# Patient Record
Sex: Male | Born: 1940
Health system: Southern US, Community
[De-identification: ages and names within clinical notes are randomized; demographics above are authoritative.]

## PROBLEM LIST (undated history)

## (undated) DIAGNOSIS — I447 Left bundle-branch block, unspecified: Secondary | ICD-10-CM

## (undated) DIAGNOSIS — I428 Other cardiomyopathies: Secondary | ICD-10-CM

## (undated) DIAGNOSIS — M1990 Unspecified osteoarthritis, unspecified site: Secondary | ICD-10-CM

## (undated) DIAGNOSIS — M75100 Unspecified rotator cuff tear or rupture of unspecified shoulder, not specified as traumatic: Secondary | ICD-10-CM

## (undated) DIAGNOSIS — F329 Major depressive disorder, single episode, unspecified: Secondary | ICD-10-CM

## (undated) DIAGNOSIS — R0602 Shortness of breath: Secondary | ICD-10-CM

## (undated) DIAGNOSIS — E785 Hyperlipidemia, unspecified: Secondary | ICD-10-CM

## (undated) DIAGNOSIS — I10 Essential (primary) hypertension: Secondary | ICD-10-CM

## (undated) DIAGNOSIS — F32A Depression, unspecified: Secondary | ICD-10-CM

## (undated) DIAGNOSIS — K635 Polyp of colon: Secondary | ICD-10-CM

## (undated) HISTORY — DX: Other cardiomyopathies: I42.8

## (undated) HISTORY — PX: BACK SURGERY: SHX140

## (undated) HISTORY — DX: Hyperlipidemia, unspecified: E78.5

## (undated) HISTORY — PX: EYE SURGERY: SHX253

## (undated) HISTORY — DX: Left bundle-branch block, unspecified: I44.7

---

## 1998-04-30 ENCOUNTER — Ambulatory Visit (HOSPITAL_COMMUNITY): Admission: RE | Admit: 1998-04-30 | Discharge: 1998-04-30 | Payer: Self-pay | Admitting: Critical Care Medicine

## 1998-04-30 ENCOUNTER — Encounter: Payer: Self-pay | Admitting: Critical Care Medicine

## 1998-05-15 ENCOUNTER — Encounter: Payer: Self-pay | Admitting: Critical Care Medicine

## 1998-05-15 ENCOUNTER — Ambulatory Visit: Admission: RE | Admit: 1998-05-15 | Discharge: 1998-05-15 | Payer: Self-pay | Admitting: Critical Care Medicine

## 1999-09-14 ENCOUNTER — Ambulatory Visit (HOSPITAL_COMMUNITY): Admission: RE | Admit: 1999-09-14 | Discharge: 1999-09-14 | Payer: Self-pay | Admitting: *Deleted

## 1999-09-14 ENCOUNTER — Encounter: Payer: Self-pay | Admitting: *Deleted

## 1999-11-09 ENCOUNTER — Ambulatory Visit (HOSPITAL_COMMUNITY)
Admission: RE | Admit: 1999-11-09 | Discharge: 1999-11-09 | Payer: Self-pay | Admitting: Physical Medicine and Rehabilitation

## 1999-11-09 ENCOUNTER — Encounter: Payer: Self-pay | Admitting: Physical Medicine and Rehabilitation

## 2001-08-07 ENCOUNTER — Ambulatory Visit (HOSPITAL_COMMUNITY): Admission: RE | Admit: 2001-08-07 | Discharge: 2001-08-07 | Payer: Self-pay | Admitting: Family Medicine

## 2001-08-07 ENCOUNTER — Encounter: Payer: Self-pay | Admitting: Family Medicine

## 2002-07-23 ENCOUNTER — Encounter: Payer: Self-pay | Admitting: Family Medicine

## 2002-07-23 ENCOUNTER — Ambulatory Visit (HOSPITAL_COMMUNITY): Admission: RE | Admit: 2002-07-23 | Discharge: 2002-07-23 | Payer: Self-pay | Admitting: Family Medicine

## 2002-08-13 ENCOUNTER — Ambulatory Visit (HOSPITAL_COMMUNITY): Admission: RE | Admit: 2002-08-13 | Discharge: 2002-08-13 | Payer: Self-pay | Admitting: Pulmonary Disease

## 2003-01-02 ENCOUNTER — Ambulatory Visit (HOSPITAL_COMMUNITY): Admission: RE | Admit: 2003-01-02 | Discharge: 2003-01-02 | Payer: Self-pay | Admitting: Internal Medicine

## 2004-08-05 ENCOUNTER — Ambulatory Visit (HOSPITAL_COMMUNITY): Admission: RE | Admit: 2004-08-05 | Discharge: 2004-08-05 | Payer: Self-pay | Admitting: Internal Medicine

## 2005-08-04 ENCOUNTER — Ambulatory Visit (HOSPITAL_COMMUNITY): Admission: RE | Admit: 2005-08-04 | Discharge: 2005-08-04 | Payer: Self-pay | Admitting: Family Medicine

## 2005-08-29 ENCOUNTER — Encounter (INDEPENDENT_AMBULATORY_CARE_PROVIDER_SITE_OTHER): Payer: Self-pay | Admitting: *Deleted

## 2005-08-29 ENCOUNTER — Ambulatory Visit (HOSPITAL_COMMUNITY): Admission: RE | Admit: 2005-08-29 | Discharge: 2005-08-29 | Payer: Self-pay | Admitting: Internal Medicine

## 2005-08-29 ENCOUNTER — Ambulatory Visit: Payer: Self-pay | Admitting: Internal Medicine

## 2006-02-13 ENCOUNTER — Ambulatory Visit (HOSPITAL_COMMUNITY): Admission: RE | Admit: 2006-02-13 | Discharge: 2006-02-13 | Payer: Self-pay | Admitting: Family Medicine

## 2006-03-06 ENCOUNTER — Ambulatory Visit (HOSPITAL_COMMUNITY): Admission: RE | Admit: 2006-03-06 | Discharge: 2006-03-06 | Payer: Self-pay | Admitting: Family Medicine

## 2006-03-10 ENCOUNTER — Ambulatory Visit (HOSPITAL_COMMUNITY): Admission: RE | Admit: 2006-03-10 | Discharge: 2006-03-10 | Payer: Self-pay | Admitting: Family Medicine

## 2006-03-23 ENCOUNTER — Ambulatory Visit (HOSPITAL_COMMUNITY): Admission: RE | Admit: 2006-03-23 | Discharge: 2006-03-23 | Payer: Self-pay | Admitting: Family Medicine

## 2006-04-20 ENCOUNTER — Ambulatory Visit (HOSPITAL_COMMUNITY): Admission: RE | Admit: 2006-04-20 | Discharge: 2006-04-20 | Payer: Self-pay | Admitting: Pulmonary Disease

## 2006-04-25 ENCOUNTER — Ambulatory Visit (HOSPITAL_COMMUNITY): Admission: RE | Admit: 2006-04-25 | Discharge: 2006-04-25 | Payer: Self-pay | Admitting: Internal Medicine

## 2006-04-25 ENCOUNTER — Ambulatory Visit: Payer: Self-pay | Admitting: Internal Medicine

## 2006-10-27 ENCOUNTER — Ambulatory Visit (HOSPITAL_COMMUNITY): Admission: RE | Admit: 2006-10-27 | Discharge: 2006-10-27 | Payer: Self-pay | Admitting: Pulmonary Disease

## 2007-04-12 ENCOUNTER — Ambulatory Visit (HOSPITAL_COMMUNITY): Admission: RE | Admit: 2007-04-12 | Discharge: 2007-04-12 | Payer: Self-pay | Admitting: Internal Medicine

## 2007-04-12 ENCOUNTER — Encounter (INDEPENDENT_AMBULATORY_CARE_PROVIDER_SITE_OTHER): Payer: Self-pay | Admitting: Internal Medicine

## 2008-02-25 ENCOUNTER — Ambulatory Visit: Payer: Self-pay | Admitting: Orthopedic Surgery

## 2008-02-25 DIAGNOSIS — M75120 Complete rotator cuff tear or rupture of unspecified shoulder, not specified as traumatic: Secondary | ICD-10-CM

## 2008-02-25 DIAGNOSIS — M25519 Pain in unspecified shoulder: Secondary | ICD-10-CM

## 2008-02-25 DIAGNOSIS — M7580 Other shoulder lesions, unspecified shoulder: Secondary | ICD-10-CM

## 2008-02-26 ENCOUNTER — Encounter (HOSPITAL_COMMUNITY): Admission: RE | Admit: 2008-02-26 | Discharge: 2008-03-27 | Payer: Self-pay | Admitting: Orthopedic Surgery

## 2008-02-26 ENCOUNTER — Encounter: Payer: Self-pay | Admitting: Orthopedic Surgery

## 2008-03-11 ENCOUNTER — Telehealth: Payer: Self-pay | Admitting: Orthopedic Surgery

## 2008-03-12 ENCOUNTER — Ambulatory Visit (HOSPITAL_COMMUNITY): Admission: RE | Admit: 2008-03-12 | Discharge: 2008-03-12 | Payer: Self-pay | Admitting: Orthopedic Surgery

## 2008-03-14 ENCOUNTER — Encounter: Payer: Self-pay | Admitting: Orthopedic Surgery

## 2008-03-17 ENCOUNTER — Ambulatory Visit: Payer: Self-pay | Admitting: Orthopedic Surgery

## 2008-03-28 ENCOUNTER — Encounter (HOSPITAL_COMMUNITY): Admission: RE | Admit: 2008-03-28 | Discharge: 2008-04-04 | Payer: Self-pay | Admitting: Orthopedic Surgery

## 2008-04-04 ENCOUNTER — Encounter: Payer: Self-pay | Admitting: Orthopedic Surgery

## 2008-05-13 ENCOUNTER — Ambulatory Visit (HOSPITAL_COMMUNITY): Admission: RE | Admit: 2008-05-13 | Discharge: 2008-05-13 | Payer: Self-pay | Admitting: Pulmonary Disease

## 2008-06-23 HISTORY — PX: CARDIOVASCULAR STRESS TEST: SHX262

## 2009-09-22 ENCOUNTER — Encounter: Payer: Self-pay | Admitting: Cardiovascular Disease

## 2009-09-22 ENCOUNTER — Encounter: Payer: Self-pay | Admitting: Emergency Medicine

## 2009-09-22 ENCOUNTER — Ambulatory Visit: Payer: Self-pay | Admitting: Cardiovascular Disease

## 2009-09-22 ENCOUNTER — Inpatient Hospital Stay (HOSPITAL_COMMUNITY): Admission: RE | Admit: 2009-09-22 | Discharge: 2009-09-23 | Payer: Self-pay | Admitting: Cardiovascular Disease

## 2009-09-22 HISTORY — PX: CARDIAC CATHETERIZATION: SHX172

## 2009-09-22 HISTORY — PX: TRANSTHORACIC ECHOCARDIOGRAM: SHX275

## 2009-09-23 ENCOUNTER — Ambulatory Visit: Payer: Self-pay | Admitting: Vascular Surgery

## 2010-04-16 LAB — CARDIAC PANEL(CRET KIN+CKTOT+MB+TROPI)
CK, MB: 1.6 ng/mL (ref 0.3–4.0)
CK, MB: 3.6 ng/mL (ref 0.3–4.0)
Relative Index: INVALID (ref 0.0–2.5)
Relative Index: INVALID (ref 0.0–2.5)
Total CK: 71 U/L (ref 7–232)
Total CK: 80 U/L (ref 7–232)
Troponin I: 0.14 ng/mL — ABNORMAL HIGH (ref 0.00–0.06)
Troponin I: 0.21 ng/mL — ABNORMAL HIGH (ref 0.00–0.06)
Troponin I: 0.37 ng/mL — ABNORMAL HIGH (ref 0.00–0.06)

## 2010-04-16 LAB — DIFFERENTIAL
Basophils Absolute: 0 10*3/uL (ref 0.0–0.1)
Basophils Relative: 1 % (ref 0–1)
Lymphocytes Relative: 17 % (ref 12–46)
Neutrophils Relative %: 66 % (ref 43–77)

## 2010-04-16 LAB — HEPATIC FUNCTION PANEL
AST: 18 U/L (ref 0–37)
Alkaline Phosphatase: 65 U/L (ref 39–117)
Total Protein: 6.7 g/dL (ref 6.0–8.3)

## 2010-04-16 LAB — BASIC METABOLIC PANEL
CO2: 27 mEq/L (ref 19–32)
Calcium: 8.9 mg/dL (ref 8.4–10.5)
GFR calc non Af Amer: >60 mL/min (ref >60)
Sodium: 141 mEq/L (ref 135–145)

## 2010-04-16 LAB — POCT CARDIAC MARKERS
CKMB, poc: 3 ng/mL (ref 1.0–8.0)
Myoglobin, poc: 57.2 ng/mL (ref 12–200)
Troponin i, poc: 0.16 ng/mL — ABNORMAL HIGH (ref 0.00–0.09)

## 2010-04-16 LAB — CBC
HCT: 37.8 % — ABNORMAL LOW (ref 39.0–52.0)
Hemoglobin: 12.3 g/dL — ABNORMAL LOW (ref 13.0–17.0)
MCH: 28.3 pg (ref 26.0–34.0)
MCHC: 33.2 g/dL (ref 30.0–36.0)
Platelets: 199 10*3/uL (ref 150–400)
RBC: 4.44 MIL/uL (ref 4.22–5.81)
RBC: 4.48 MIL/uL (ref 4.22–5.81)
RDW: 14.1 % (ref 11.5–15.5)
WBC: 5.4 10*3/uL (ref 4.0–10.5)

## 2010-04-16 LAB — LIPID PANEL
HDL: 45 mg/dL (ref >39)
LDL Cholesterol: 58 mg/dL (ref 0–99)
Total CHOL/HDL Ratio: 2.4 RATIO
Triglycerides: 28 mg/dL (ref <150)

## 2010-04-16 LAB — PROTIME-INR
INR: 1.04 (ref 0.00–1.49)
Prothrombin Time: 13.8 seconds (ref 11.6–15.2)

## 2010-05-03 ENCOUNTER — Ambulatory Visit (HOSPITAL_COMMUNITY)
Admission: RE | Admit: 2010-05-03 | Discharge: 2010-05-03 | Disposition: A | Discharge status: Home / Self Care | Payer: Medicare Other | Source: Ambulatory Visit | Attending: Pulmonary Disease | Admitting: Pulmonary Disease

## 2010-05-03 ENCOUNTER — Other Ambulatory Visit (HOSPITAL_COMMUNITY): Payer: Self-pay | Admitting: Pulmonary Disease

## 2010-05-03 DIAGNOSIS — R0602 Shortness of breath: Secondary | ICD-10-CM | POA: Insufficient documentation

## 2010-05-03 DIAGNOSIS — J849 Interstitial pulmonary disease, unspecified: Secondary | ICD-10-CM

## 2010-05-03 DIAGNOSIS — I10 Essential (primary) hypertension: Secondary | ICD-10-CM | POA: Insufficient documentation

## 2010-05-03 DIAGNOSIS — J8410 Pulmonary fibrosis, unspecified: Secondary | ICD-10-CM | POA: Insufficient documentation

## 2010-06-18 NOTE — Op Note (Signed)
NAME:  Matthew Weiss, Matthew Weiss               ACCOUNT NO.:  [id number]   MEDICAL RECORD NO.:  [id number]          PATIENT TYPE:  AMB   LOCATION:  DAY                           FACILITY:  [facility]   PHYSICIAN:  Lionel December, M.D.    DATE OF BIRTH:  1940-03-06   DATE OF PROCEDURE:  08/29/2005  DATE OF DISCHARGE:                                 OPERATIVE REPORT   PROCEDURE:  Colonoscopy with polypectomy.   INDICATION:  [name] is a 70 year old African-American male who is here for  screening colonoscopy.  Family history is negative for colorectal carcinoma.  Procedure is reviewed with the patient and informed consent was obtained.   CONSCIOUS SEDATION:  1.  Demerol 50 mg IV.  2.  Versed 7 mg IV.   FINDINGS:  Procedure performed in Endoscopy Suite.  Patient's vital signs  and O2 sat were monitored during procedure and remained stable.  Patient was  placed in the left lateral recumbent position and rectal examination  performed.  No abnormality noted on external or digital exam.  Olympus  videoscope was placed in rectum and advanced under vision in the sigmoid  colon and beyond.  Preparation was excellent.  The scope was passed in the  cecum which was identified by the appendiceal orifice and ileocecal valve.  A picture was taken for the record.  As the scope was withdrawn colonic  mucosa was carefully examined.  There was a large polyp at ascending colon  with a polypoidal and a sessile part.  Polypoidal part was snared.  Rest of  the polyp was snared piecemeal.  Polypectomy was felt to be complete.  The  large and smaller fragments were caught with a net and retrieved for  histologic examination.  Endoscope was passed again to splenic flexure.  The  mucosa, rest of the colon was normal.  Rectal mucosa similarly was normal.  The scope was retroflexed, examined the anorectal junction which was  unremarkable.  Endoscope was straightened and withdrawn.  The patient  tolerated the procedure well.   FINAL DIAGNOSIS:  Large, i.e., over 2 cm polyp snared from ascending colon  as described above.   RECOMMENDATIONS:  1.  No aspirin for 10 days.  2.  I will be contacting patient with biopsy results.  3.  Presuming this is a benign polyp, he will return for repeat exam in 6      months so to make sure that he does not have any residual polyp.      Lionel December, M.D.  Electronically Signed     NR/MEDQ  D:  08/29/2005  T:  08/29/2005  Job:  [id number]   cc:   Angus G. Renard Matter, MD  Fax: [phone]

## 2010-06-18 NOTE — Procedures (Signed)
NAME:  Matthew Weiss, Matthew Weiss NO.:  [id number]   MEDICAL RECORD NO.:  [id number]          PATIENT TYPE:  OUT   LOCATION:  RESP                          FACILITY:  [facility]   PHYSICIAN:  Edward L. Juanetta Gosling, M.D.DATE OF BIRTH:  1940-10-10   DATE OF PROCEDURE:  DATE OF DISCHARGE:  10/27/2006                            PULMONARY FUNCTION TEST   [name] and  1. Spirometry shows of a moderate ventilatory defect with evidence of      airflow obstruction.  2. Lung volumes are also moderately reduced, at approximately the same      order of magnitude as a ventilatory defect.  3. DLCO is normal.  4. Arterial blood gases were normal.  5. There is no significant bronchodilator effect.  6. There has been some improvement in airflow, but total lung capacity      has decreased, since the last study.      Edward L. Juanetta Gosling, M.D.  Electronically Signed     ELH/MEDQ  D:  10/31/2006  T:  10/31/2006  Job:  [id number]

## 2010-06-18 NOTE — Procedures (Signed)
NAME:  Matthew Weiss, Matthew Weiss NO.:  [id number]   MEDICAL RECORD NO.:  [id number]          PATIENT TYPE:  OUT   LOCATION:  RESP                          FACILITY:  [facility]   PHYSICIAN:  Edward L. Juanetta Gosling, M.D.DATE OF BIRTH:  [birth year]   DATE OF PROCEDURE:  04/20/2006  DATE OF DISCHARGE:                            PULMONARY FUNCTION TEST   RESULTS:  1. Spirometry shows a moderate ventilatory defect with airflow      obstruction.  2. Lung volumes show reduction in total lung capacity which is mild to      moderate.  3. DLCO is normal.  4. Arterial blood gases are normal.  5. There is no significant bronchodilator effect.      Edward L. Juanetta Gosling, M.D.  Electronically Signed     ELH/MEDQ  D:  04/20/2006  T:  04/20/2006  Job:  [id number]

## 2010-06-18 NOTE — Procedures (Signed)
NAME:  Matthew Weiss, Matthew Weiss NO.:  [id number]   MEDICAL RECORD NO.:  [id number]          PATIENT TYPE:  OUT   LOCATION:  RESP                          FACILITY:  [facility]   PHYSICIAN:  Edward L. Juanetta Gosling, M.D.DATE OF BIRTH:  02/18/1940   DATE OF PROCEDURE:  DATE OF DISCHARGE:  05/13/2008                            PULMONARY FUNCTION TEST   PULMONARY FUNCTION TEST:  1. Spirometry shows a moderate ventilatory defect with some evidence      of airflow obstruction, but the flow-volume loop is suggestive more      of a restrictive change.  2. Lung volumes show moderate restrictive change.  3. DLCO is normal.  4. There is no significant bronchodilator improvement.  5. There is little change from pulmonary function testing of March      2008.      Edward L. Juanetta Gosling, M.D.  Electronically Signed     ELH/MEDQ  D:  05/15/2008  T:  05/15/2008  Job:  [id number]

## 2010-06-18 NOTE — Op Note (Signed)
NAME:  Matthew Weiss, Matthew Weiss               ACCOUNT NO.:  [id number]   MEDICAL RECORD NO.:  [id number]          PATIENT TYPE:  AMB   LOCATION:  DAY                           FACILITY:  [facility]   PHYSICIAN:  Lionel December, M.D.    DATE OF BIRTH:  [birth year]   DATE OF PROCEDURE:  04/25/2006  DATE OF DISCHARGE:                               OPERATIVE REPORT   DATE OF PROCEDURE:  April 25, 2006   PROCEDURE:  Colonoscopy.   INDICATIONS:  [name] is a 70 year old African-American male who  underwent screening colonoscopy in July, 2007.  He had a large polyp  removed from his the ascending colon which was partly pedunculated,  partly sessile; it was a tubular adenoma.  The patient was advised  followup exam to make sure there was no residual polyp.  Exam at six  months to make sure he does not have any residual polyp at this site.  He remains free of GI symptoms.  Procedure was reviewed with the patient  and informed consent was obtained.   MEDICATIONS:  Meds for conscious sedation, Demerol 50 mg IV, Versed 6 mg  IV in divided doses.   DESCRIPTION OF PROCEDURE:  Procedure performed in endoscopy suite.  The  patient's vital signs and O2 sat were monitored during procedure and  remained stable.  The patient was placed in the left lateral position  and rectal examination performed.  No abnormality noted on external or  digital exam.  Pentax videoscope was placed in the rectum and advanced  under direct vision and advanced into the sigmoid colon beyond. The  preparation was excellent.  Scope was passed into the cecum which was  identified by appendiceal orifice and ileocecal valve.  Pictures taken  for the record.  As the scope was withdrawn colonic mucosa was carefully  examined.  There was a scar marked by converging folds at the ascending  colon, along the right side, felt to be polypectomy site.  There was no  residual polyp noted.  The rest of the colonic mucosa was carefully  examined as the  scope was gradually withdrawn and there were no other  mucosal abnormalities.  Rectal mucosa similarly was normal.  Scope was  retroflexed to examine anorectal junction and small hemorrhoids were  noted below the dentate line.  Endoscope was then withdrawn.  The  patient tolerated the procedure well.   FINAL DIAGNOSIS:  1. Examination performed to the cecum.  2. No evidence of residual polyp at the ascending colon, site of      previous polypectomy.  3. Small external hemorrhoids.   RECOMMENDATIONS:  He will resume his usual medicines and diet.   Yearly hemoccults.  He will return for the next exam in five years from  now.      Lionel December, M.D.  Electronically Signed     NR/MEDQ  D:  04/25/2006  T:  04/25/2006  Job:  [id number]   cc:   Angus G. Renard Matter, MD  Fax: [phone]

## 2010-06-18 NOTE — Procedures (Signed)
   NAME:  Matthew Weiss, Matthew Weiss NO.:  [id number]   MEDICAL RECORD NO.:  [id number]                   PATIENT TYPE:  OUT   LOCATION:  DFTL                                 FACILITY:  [facility]   PHYSICIAN:  Edward L. Juanetta Gosling, M.D.             DATE OF BIRTH:  [birth year]   DATE OF PROCEDURE:  08/13/2002  DATE OF DISCHARGE:                                    STRESS TEST   INDICATIONS FOR PROCEDURE:  Matthew Weiss has history of hypertension.  He is  undergoing graded exercise testing to rule out ischemic cardiac disease as a  consequence of his hypertension.  There are no contraindications to graded  exercise testing.   Matthew Weiss exercised for seven minutes 30 seconds on the Bruce protocol,  reaching and sustaining 10.1. METs.  His maximum recorded heart rate was 155  which is 97% of his age-predicted maximal heart rate.  His blood pressure  response to exercise was somewhat exaggerated.  He had no  electrocardiographic changes suggestive of inducible ischemia.  At the peak  of exercise he had nonspecific ST abnormalities in V6 which was isolated.  He had no symptoms during exercise.   IMPRESSION:  1. Good exercise tolerance.  2. Nonspecific ST-T wave changes isolated in V6 at the peak of exercise but     no definitive ischemic changes.  3. Somewhat hypertensive response to exercise.  4. No symptoms of exercise.                                               Edward L. Juanetta Gosling, M.D.    ELH/MEDQ  D:  08/13/2002  T:  08/13/2002  Job:  [id number]   cc:   Angus G. Renard Matter, M.D.  [address]  [place]  [place] [address]  Fax: [phone]

## 2010-10-01 ENCOUNTER — Emergency Department (HOSPITAL_COMMUNITY)
Admission: EM | Admit: 2010-10-01 | Discharge: 2010-10-01 | Disposition: A | Discharge status: Home / Self Care | Payer: Medicare Other | Attending: Emergency Medicine | Admitting: Emergency Medicine

## 2010-10-01 ENCOUNTER — Emergency Department (HOSPITAL_COMMUNITY): Payer: Medicare Other

## 2010-10-01 ENCOUNTER — Encounter: Payer: Self-pay | Admitting: Emergency Medicine

## 2010-10-01 DIAGNOSIS — E785 Hyperlipidemia, unspecified: Secondary | ICD-10-CM | POA: Insufficient documentation

## 2010-10-01 DIAGNOSIS — S6710XA Crushing injury of unspecified finger(s), initial encounter: Secondary | ICD-10-CM | POA: Insufficient documentation

## 2010-10-01 DIAGNOSIS — S61319A Laceration without foreign body of unspecified finger with damage to nail, initial encounter: Secondary | ICD-10-CM

## 2010-10-01 DIAGNOSIS — T148XXA Other injury of unspecified body region, initial encounter: Secondary | ICD-10-CM

## 2010-10-01 DIAGNOSIS — Z87891 Personal history of nicotine dependence: Secondary | ICD-10-CM | POA: Insufficient documentation

## 2010-10-01 DIAGNOSIS — S61209A Unspecified open wound of unspecified finger without damage to nail, initial encounter: Secondary | ICD-10-CM | POA: Insufficient documentation

## 2010-10-01 DIAGNOSIS — Y92009 Unspecified place in unspecified non-institutional (private) residence as the place of occurrence of the external cause: Secondary | ICD-10-CM | POA: Insufficient documentation

## 2010-10-01 DIAGNOSIS — I10 Essential (primary) hypertension: Secondary | ICD-10-CM | POA: Insufficient documentation

## 2010-10-01 DIAGNOSIS — W230XXA Caught, crushed, jammed, or pinched between moving objects, initial encounter: Secondary | ICD-10-CM | POA: Insufficient documentation

## 2010-10-01 HISTORY — DX: Essential (primary) hypertension: I10

## 2010-10-01 MED ORDER — CEPHALEXIN 500 MG PO CAPS: 500.0000 mg | ORAL_CAPSULE | Freq: Four times a day (QID) | ORAL | Status: AC

## 2010-10-01 MED ORDER — OXYCODONE-ACETAMINOPHEN 5-325 MG PO TABS: 2.0000 | ORAL_TABLET | ORAL | Status: AC | PRN

## 2010-10-01 MED ORDER — LIDOCAINE HCL (PF) 1 % IJ SOLN
30.0000 mL | Freq: Once | INTRAMUSCULAR | Status: DC
  Filled 2010-10-01 (×2): qty 5

## 2010-10-01 MED ORDER — TETANUS-DIPHTHERIA TOXOIDS TD 5-2 LFU IM INJ
0.5000 mL | INJECTION | Freq: Once | INTRAMUSCULAR | Status: AC
  Administered 2010-10-01: 0.5 mL via INTRAMUSCULAR
  Filled 2010-10-01 (×2): qty 0.5

## 2010-10-01 NOTE — ED Provider Notes (Signed)
Scribed for Matthew Octave, MD, the patient was seen in room APA18/AP[facility]18. This chart was scribed by [employer]. The patient's care started at 16:24  CSN: [id number] Arrival date & time: 10/01/2010  4:05 PM  Chief Complaint  Patient presents with  . Finger Injury   HPI Matthew Weiss is a 70 y.o. male with history of HTN who presents to the Emergency Department complaining of finger injury. Patient reports his right thumb was pinched right between the gas pedal and metal portion of a lawn mower. Patient is in no acute distress.  Past Medical History  Diagnosis Date  . Hypertension   . Hyperlipemia     Past Surgical History  Procedure Date  . Back surgery     No family history on file.  History  Substance Use Topics  . Smoking status: Former Games developer  . Smokeless tobacco: Not on file  . Alcohol Use: No      Review of Systems  Musculoskeletal:       Crush injury to right thumb  All other systems reviewed and are negative.    Physical Exam  BP 143/74  Pulse 77  Temp(Src) 98.3 F (36.8 C) (Oral)  Resp 20  Ht 5\' 9"  (1.753 m)  Wt 168 lb (76.204 kg)  BMI 24.81 kg/m2  SpO2 98%  Physical Exam  Nursing note and vitals reviewed. Constitutional: He appears well-developed and well-nourished. No distress.  HENT:  Head: Normocephalic and atraumatic.  Eyes: EOM are normal. Pupils are equal, round, and reactive to light.  Neck: Normal range of motion. Neck supple. No tracheal deviation present.  Cardiovascular:  Pulses:      Radial pulses are 2+ on the right side.  Pulmonary/Chest: No respiratory distress.  Abdominal: Soft. Bowel sounds are normal. He exhibits no distension.  Musculoskeletal:       Hands: Skin: Skin is warm and dry. No rash noted. He is not diaphoretic. No erythema. No pallor.  Psychiatric: He has a normal mood and affect. His behavior is normal.    ED Course  LACERATION REPAIR Date/Time: 10/01/2010 5:25 PM Performed by: Matthew Weiss Authorized by: Matthew Weiss Consent: Verbal consent obtained. Risks and benefits: risks, benefits and alternatives were discussed Consent given by: patient Patient understanding: patient states understanding of the procedure being performed Patient consent: the patient's understanding of the procedure matches consent given Imaging studies: imaging studies available Patient identity confirmed: verbally with patient Time out: Immediately prior to procedure a "time out" was called to verify the correct patient, procedure, equipment, support staff and site/side marked as required. Body area: upper extremity Location details: right thumb Laceration length: 2 cm Tendon involvement: none Nerve involvement: none Vascular damage: no Anesthesia: digital block Local anesthetic: lidocaine 1% without epinephrine Anesthetic total: 20 ml Patient sedated: no Preparation: Patient was prepped and draped in the usual sterile fashion. Irrigation solution: saline Irrigation method: syringe Amount of cleaning: extensive Debridement: minimal Degree of undermining: minimal Subcutaneous closure: 5-0 Chromic gut Number of sutures: 6 Technique: simple Approximation: loose Approximation difficulty: complex Dressing: 4x4 sterile gauze, gauze roll and splint Patient tolerance: Patient tolerated the procedure well with no immediate complications. Comments: Fractured distal nailbed removed and underlying T shaped nail bed laceration identified.  Nailbed repaired with loose chromic gut sutures. Explained to patient that underlying nail plate deformity would be expected.    . OTHER DATA REVIEWED: Nursing notes, vital signs, and past medical records reviewed.    DIAGNOSTIC STUDIES: Oxygen Saturation is 98% on room  air, normal by my interpretation.    LABS / RADIOLOGY:   Dg Finger Thumb Right  10/01/2010   IMPRESSION: No acute bony findings.  Tiny radiodense foreign body in the soft tissue  wound.  Original Report Authenticated By: ERIC A. MANSELL, M.D.     ED COURSE / COORDINATION OF CARE: 16:25 - EDMD examined patient's thumb and ordered the following Orders Placed This Encounter  Procedures  . DG Finger Thumb Right  . Suture cart     MDM: Crush injury to thumb with nailbed involvement.  Xray, update tetanus.  Patient declining pain medications Nail plate removed and nail bed repaired as above.  Antibitiocs, pain control, follow up hand clinic.    MEDICATIONS GIVEN IN THE E.D.  Medications  lidocaine (XYLOCAINE) 1 % injection 30 mL (not administered)  tetanus & diphtheria toxoids (adult) [facility]) injection 0.5 mL (not administered)     SCRIBE ATTESTATION:I personally performed the services described in this documentation, which was scribed in my presence.  The recorded information has been reviewed and considered.   Matthew Octave, MD 10/02/10 [phone]

## 2010-10-01 NOTE — ED Notes (Signed)
edp in to numb area. Nad.

## 2010-10-01 NOTE — ED Notes (Signed)
Patient reports crushing his thumb between the gas petal and the metal portion of a lawn mower. Crush Injury to right thumb. Finger nail noted to be broken. Bleeding controlled. Wound dressed with sterile dressing in triage.

## 2010-11-11 LAB — BLOOD GAS, ARTERIAL
Acid-Base Excess: 1
Bicarbonate: 25.3 — ABNORMAL HIGH
FIO2: 21
O2 Saturation: 96.6
pCO2 arterial: 41.8
pO2, Arterial: 85.9

## 2011-03-09 ENCOUNTER — Ambulatory Visit: Payer: Medicare Other | Admitting: Orthopedic Surgery

## 2011-05-02 ENCOUNTER — Other Ambulatory Visit: Payer: Self-pay

## 2011-05-02 DIAGNOSIS — J8410 Pulmonary fibrosis, unspecified: Secondary | ICD-10-CM

## 2011-05-03 ENCOUNTER — Ambulatory Visit (HOSPITAL_COMMUNITY)
Admission: RE | Admit: 2011-05-03 | Discharge: 2011-05-03 | Disposition: A | Discharge status: Home / Self Care | Payer: Medicare Other | Source: Ambulatory Visit | Attending: Pulmonary Disease | Admitting: Pulmonary Disease

## 2011-05-03 ENCOUNTER — Other Ambulatory Visit (HOSPITAL_COMMUNITY): Payer: Self-pay | Admitting: Pulmonary Disease

## 2011-05-03 DIAGNOSIS — R0989 Other specified symptoms and signs involving the circulatory and respiratory systems: Secondary | ICD-10-CM | POA: Insufficient documentation

## 2011-05-03 DIAGNOSIS — R05 Cough: Secondary | ICD-10-CM | POA: Insufficient documentation

## 2011-05-03 DIAGNOSIS — J8410 Pulmonary fibrosis, unspecified: Secondary | ICD-10-CM | POA: Insufficient documentation

## 2011-05-03 DIAGNOSIS — R059 Cough, unspecified: Secondary | ICD-10-CM | POA: Insufficient documentation

## 2011-05-03 DIAGNOSIS — R0609 Other forms of dyspnea: Secondary | ICD-10-CM | POA: Insufficient documentation

## 2011-05-03 MED ORDER — ALBUTEROL SULFATE (5 MG/ML) 0.5% IN NEBU
2.5000 mg | INHALATION_SOLUTION | Freq: Once | RESPIRATORY_TRACT | Status: AC
  Administered 2011-05-03: 2.5 mg via RESPIRATORY_TRACT

## 2011-05-05 NOTE — Procedures (Signed)
NAME:  Matthew Weiss, Matthew Weiss NO.:  [id number]  MEDICAL RECORD NO.:  [id number]  LOCATION:                                 FACILITY:  PHYSICIAN:  Glorie Dowlen L. Juanetta Gosling, M.D.DATE OF BIRTH:  December 05, 1940  DATE OF PROCEDURE: DATE OF DISCHARGE:                           PULMONARY FUNCTION TEST   Reason for pulmonary function testing is pulmonary fibrosis. 1. Spirometry shows a moderate ventilatory defect without definite     airflow obstruction. 2. Lung volumes show reduction in total lung capacity of approximately     the same order of magnitude as the ventilatory defect suggesting a     primary restrictive change. 3. DLCO is moderately reduced but does correct somewhat with     ventilation. 4. Airway resistance is mildly elevated. 5. There is no significant bronchodilator improvement. 6. This is consistent with the clinical diagnosis of pulmonary     fibrosis.     Jadore Mcguffin L. Juanetta Gosling, M.D.     ELH/MEDQ  D:  05/04/2011  T:  05/04/2011  Job:  [id number]  cc:   Catalina Pizza, M.D. Fax: [phone]

## 2011-05-20 LAB — PULMONARY FUNCTION TEST

## 2011-05-25 NOTE — Procedures (Signed)
NAME:  Matthew Weiss, FRONCZAK NO.:  [id number]  MEDICAL RECORD NO.:  [id number]  LOCATION:                                 FACILITY:  PHYSICIAN:  Diana Armijo L. Juanetta Gosling, M.D.DATE OF BIRTH:  [birth year]  DATE OF PROCEDURE:  05/03/2011 DATE OF DISCHARGE:                           PULMONARY FUNCTION TEST   Reason for pulmonary function testing is pulmonary fibrosis. 1. Spirometry shows a moderate ventilatory defect without definite     airflow obstruction. 2. Lung volumes are moderately to severely reduced. 3. DLCO is moderately reduced. 4. Airway resistance is slightly high. 5. There is no significant bronchodilator improvement. 6. This is consistent with clinical diagnosis of pulmonary fibrosis.     Margee Trentham L. Juanetta Gosling, M.D.     ELH/MEDQ  D:  05/24/2011  T:  05/24/2011  Job:  [id number]

## 2011-08-26 ENCOUNTER — Other Ambulatory Visit (INDEPENDENT_AMBULATORY_CARE_PROVIDER_SITE_OTHER): Payer: Self-pay | Admitting: *Deleted

## 2011-08-26 ENCOUNTER — Telehealth (INDEPENDENT_AMBULATORY_CARE_PROVIDER_SITE_OTHER): Payer: Self-pay | Admitting: *Deleted

## 2011-08-26 DIAGNOSIS — Z86010 Personal history of colonic polyps: Secondary | ICD-10-CM

## 2011-08-26 DIAGNOSIS — Z1211 Encounter for screening for malignant neoplasm of colon: Secondary | ICD-10-CM

## 2011-08-26 NOTE — Telephone Encounter (Signed)
Patient needs movi prep 

## 2011-08-29 MED ORDER — PEG-KCL-NACL-NASULF-NA ASC-C 100 G PO SOLR: 1.0000 | Freq: Once | ORAL | Status: DC

## 2011-09-12 IMAGING — CR DG CHEST 1V PORT
1 series · 1 of 1 positions shown · non-contrast
Comparison: 04/12/2007 and earlier.

CLINICAL DATA: 69-year-old male with chest pain.

PORTABLE CHEST - 1 VIEW

[view not recorded]
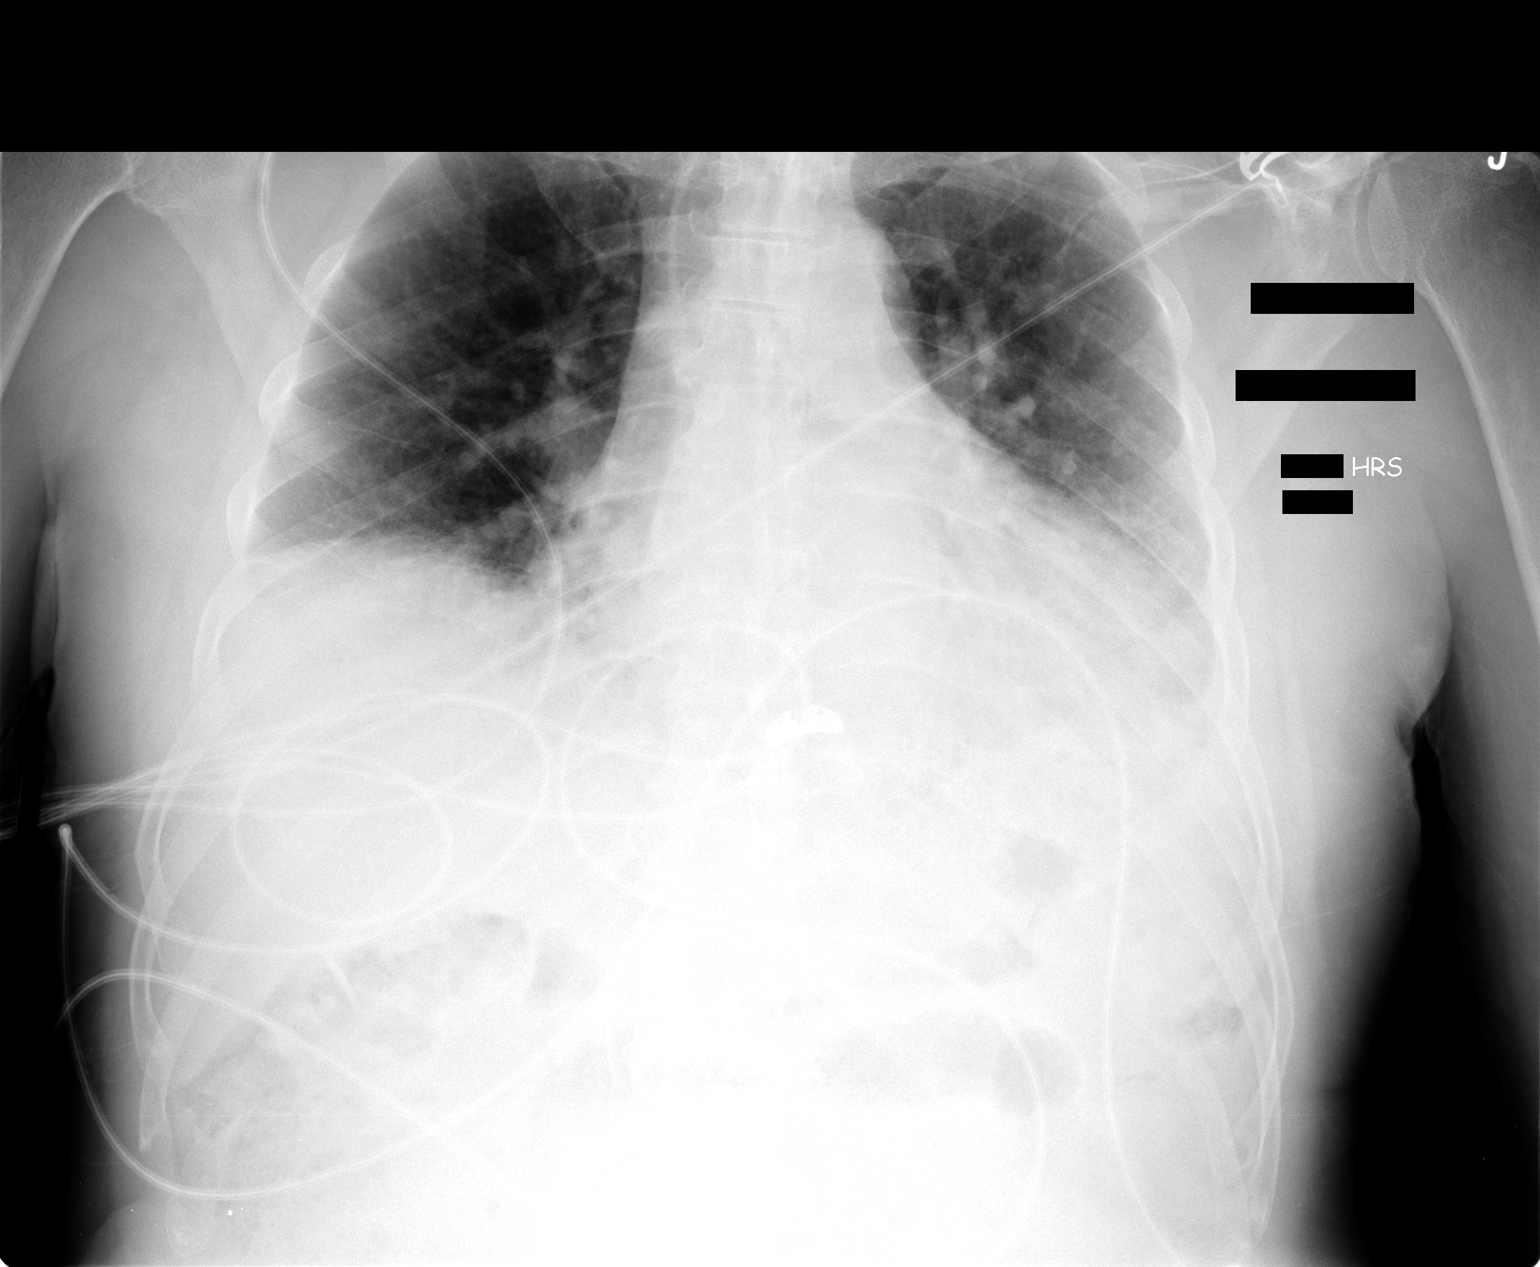

[1 of 1 positions shown; findings below may reference images not displayed]

FINDINGS: Portable upright AP view 5393 hours.  Low lung volumes.
Acute-on-chronic patchy bibasilar opacity.  No pneumothorax,
pulmonary edema or definite pleural effusion. Stable cardiomegaly
and mediastinal contours.
IMPRESSION: Low lung volumes with atelectasis superimposed on chronic basilar
lung disease.

## 2011-09-12 IMAGING — CT CT ANGIO CHEST
2 of 5 series · 19 of 36 positions shown · IV contrast (APPLIED)
Comparison: 03/10/2006

CLINICAL DATA: Right chest pain, shortness of breath

CT ANGIOGRAPHY CHEST WITH CONTRAST
TECHNIQUE: Multidetector CT imaging of the chest was performed
using the standard protocol during bolus administration of
intravenous contrast.  Multiplanar CT image reconstructions
including MIPs were obtained to evaluate the vascular anatomy.
Contrast:  100 ml [id number] IV

[Series 8: pulm embolism 1.0 b25f thins · axial · 0.70mm/px · z∈[-270,-48]mm · 18 of 248 slices shown]
[im 13/248  lung]
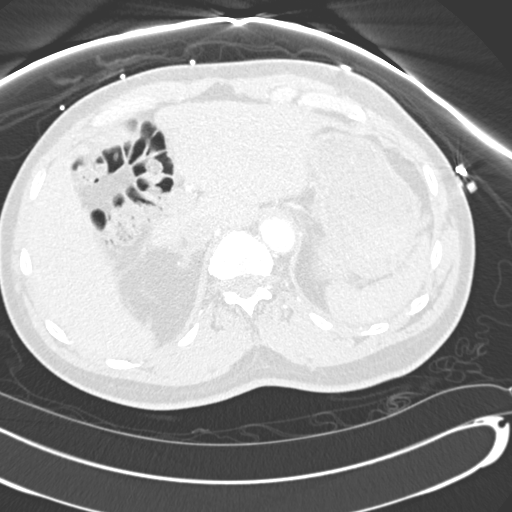
[im 25/248  mediastinal]
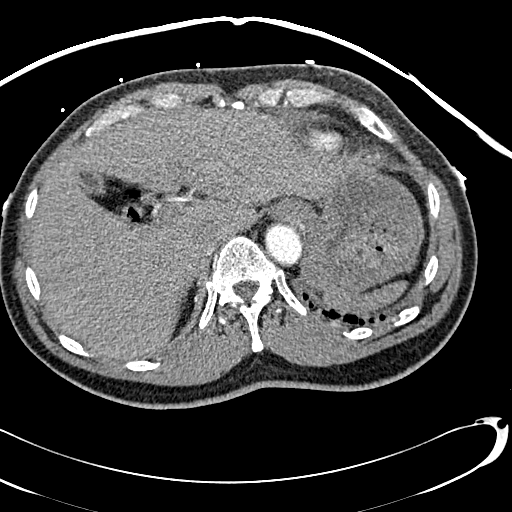
[im 38/248  lung]
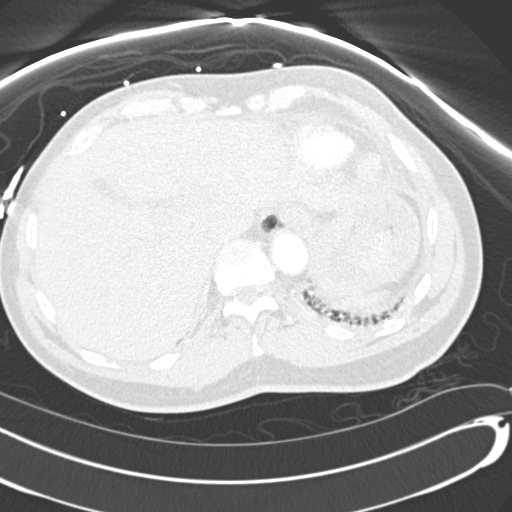
[im 50/248  mediastinal]
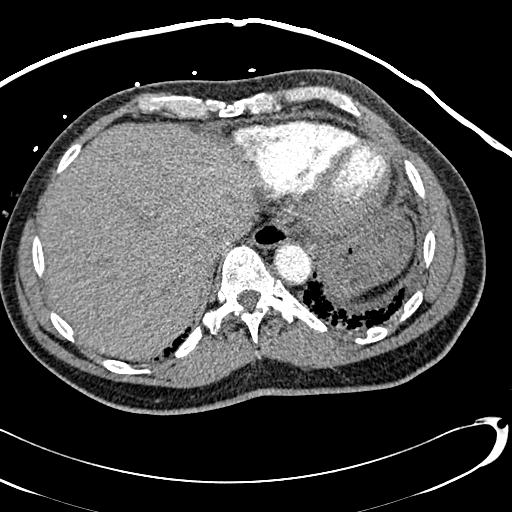
[im 62/248  lung]
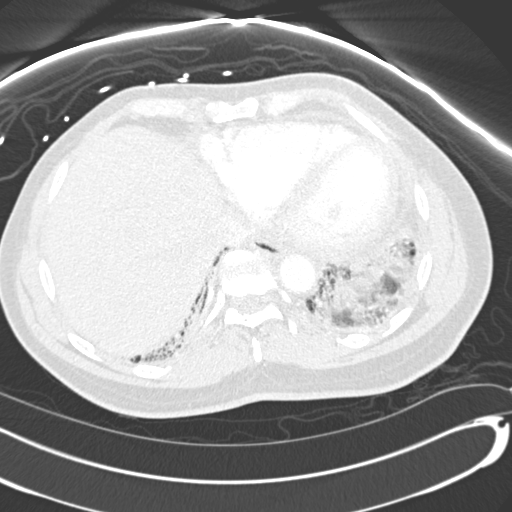
[im 75/248  mediastinal]
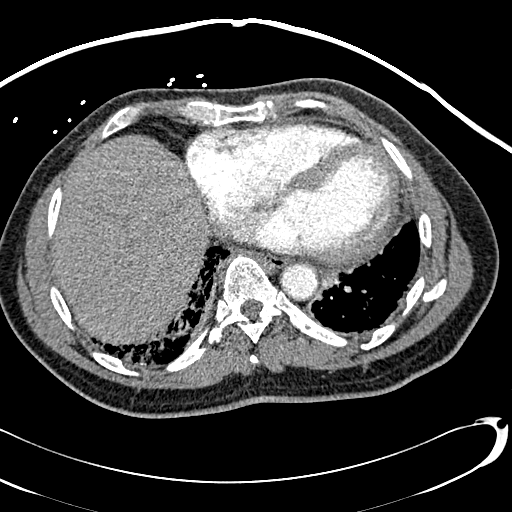
[im 87/248  lung]
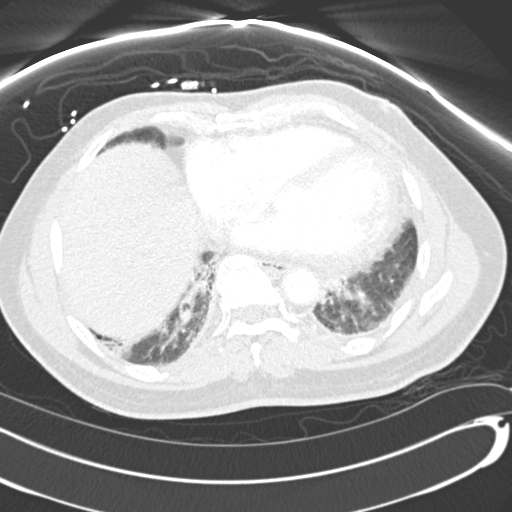
[im 99/248  mediastinal]
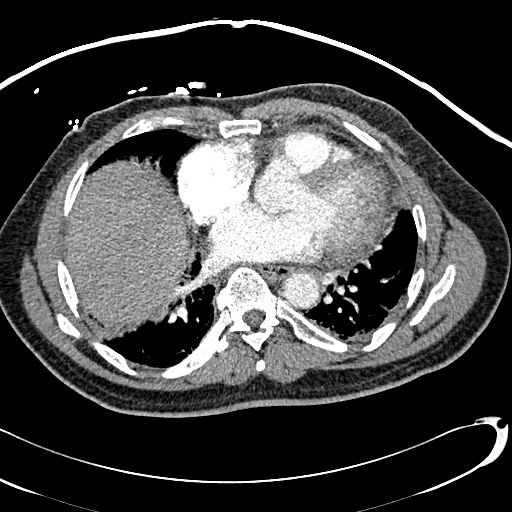
[im 112/248  lung]
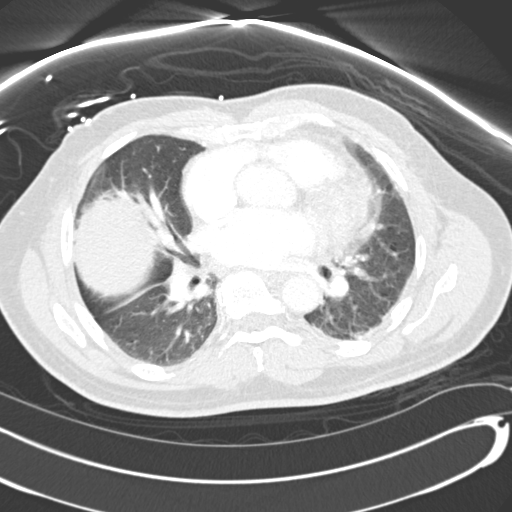
[im 136/248  mediastinal]
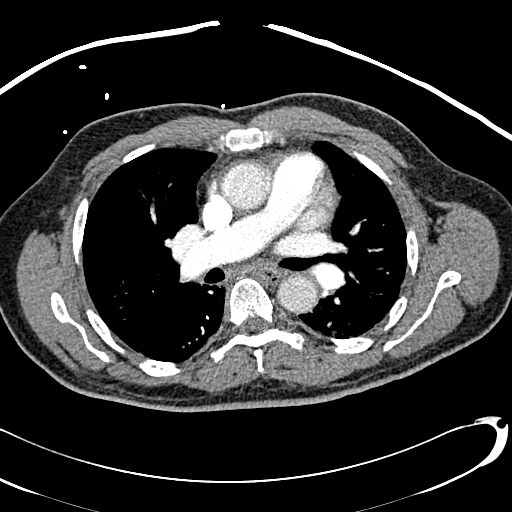
[im 149/248  lung]
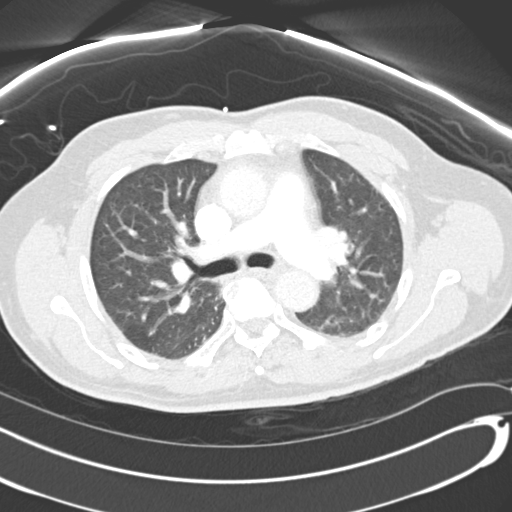
[im 161/248  mediastinal]
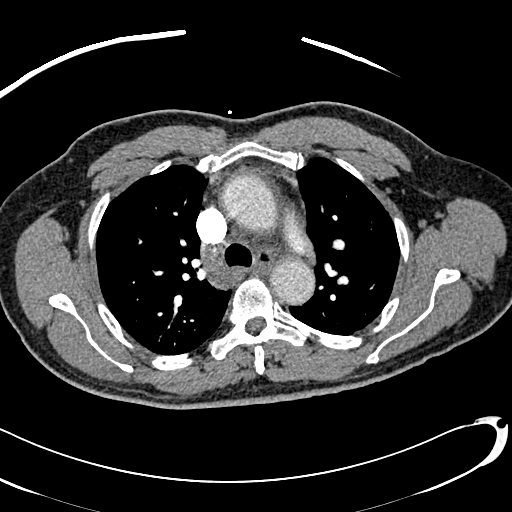
[im 173/248  lung]
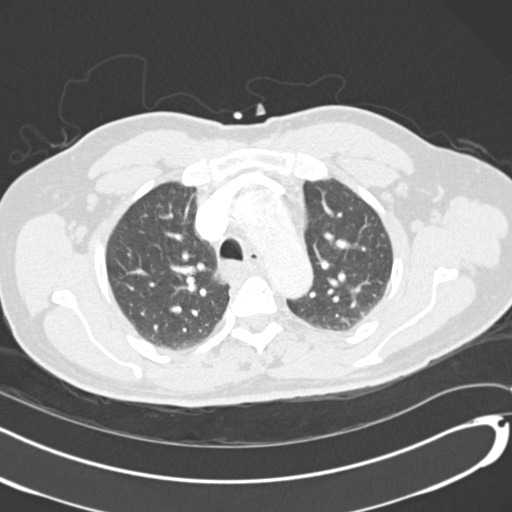
[im 186/248  mediastinal]
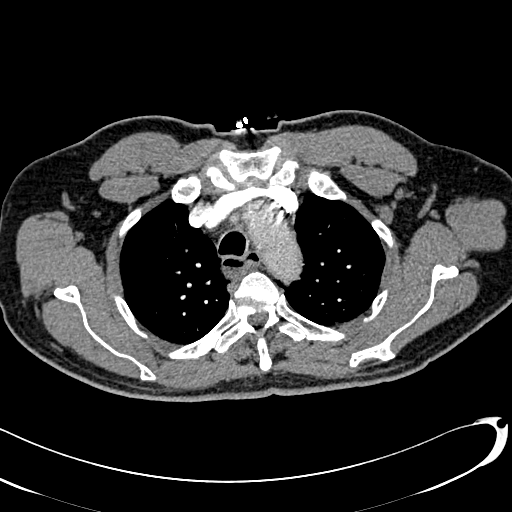
[im 198/248  lung]
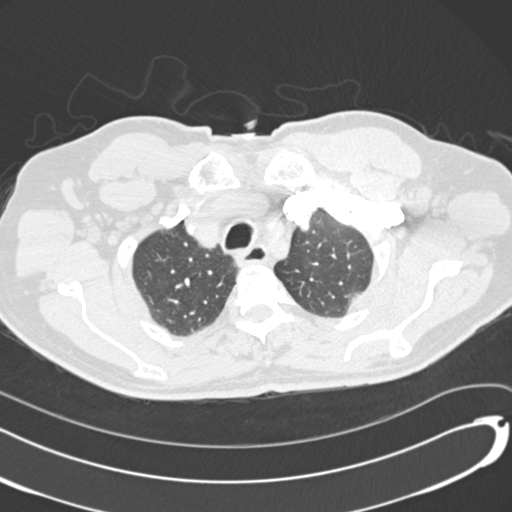
[im 210/248  mediastinal]
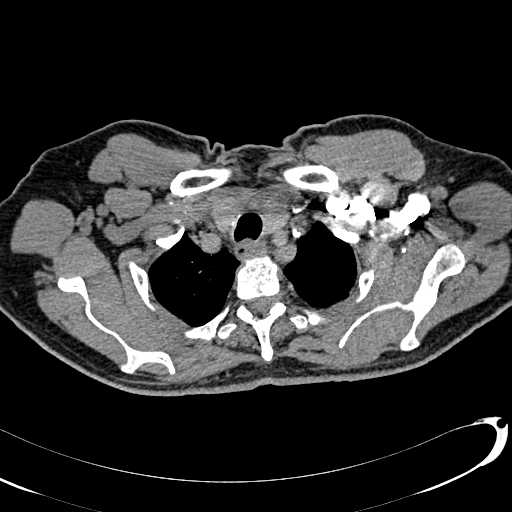
[im 223/248  lung]
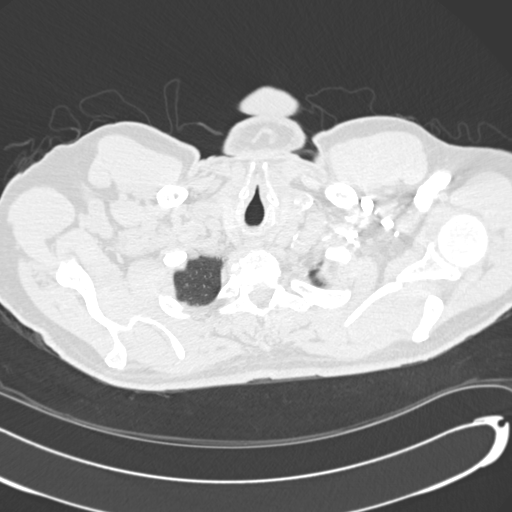
[im 235/248  mediastinal]
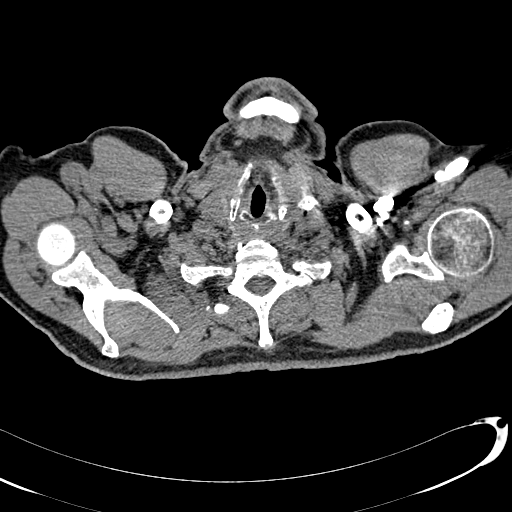

[Series 602: coronal mpr · coronal · 0.70mm/px · 1 of 58 slices shown]
[im 29/58  mediastinal]
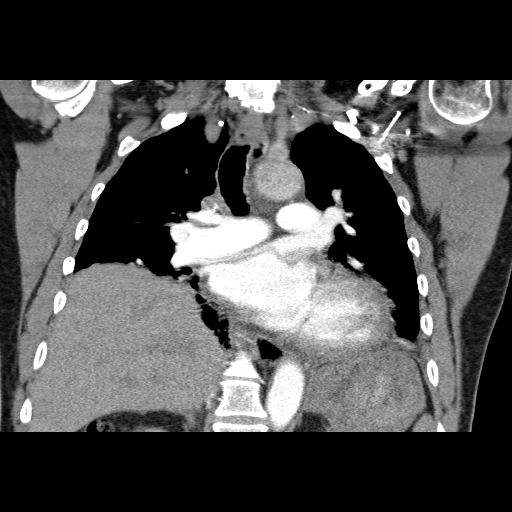

[19 of 36 positions shown; findings below may reference images not displayed]

FINDINGS: There is good contrast opacification of pulmonary artery
branches with no discrete filling defects to suggest acute PE.
Patient breathing degrades some of the images especially at the
lung bases.  There is early contrast opacification of the thoracic
aorta which appears unremarkable.  No pleural or pericardial
effusion.  Calcified right pleural plaque is noted medially at the
lung base.  No hilar or mediastinal adenopathy.  There is four-
chamber cardiac enlargement.  There are fibrotic changes in the
basilar segments of both lower lobes with coarse adjacent
interstitial thickening, which was evident on the prior study.
Minimal spondylitic changes in the lower thoracic spine. Visualized
portions of upper abdomen unremarkable.

Review of the MIP images confirms the above findings.
IMPRESSION: 1.  Negative for acute PE.
2.  Chronic bibasilar pulmonary fibrosis.
3.  Four-chamber cardiac enlargement.

## 2011-09-26 ENCOUNTER — Encounter (HOSPITAL_COMMUNITY): Payer: Self-pay | Admitting: Pharmacy Technician

## 2011-09-28 ENCOUNTER — Telehealth (INDEPENDENT_AMBULATORY_CARE_PROVIDER_SITE_OTHER): Payer: Self-pay | Admitting: *Deleted

## 2011-09-28 NOTE — Telephone Encounter (Signed)
PCP/Requesting MD: hall  Name & DOB: ramona slinger 10/13/1940     Procedure: tcs  Reason/Indication:  Hx polyps  Has patient had this procedure before?  yes  If so, when, by whom and where?  3/08  Is there a family history of colon cancer?  no  Who?  What age when diagnosed?    Is patient diabetic?   no      Does patient have prosthetic heart valve?  no  Do you have a pacemaker?  no  Has patient had joint replacement within last 12 months?  no  Is patient on Coumadin, Plavix and/or Aspirin? yes  Medications: asa 81 mg daily, celebrex 200 mg daily, losartan 100 mg daily, amlodipine 5 mg daily, multi vit, fish oil  Allergies: nkda  Medication Adjustment: asa 2 days  Procedure date & time: 10/12/11 at 930

## 2011-09-30 NOTE — Telephone Encounter (Signed)
agree

## 2011-10-12 ENCOUNTER — Encounter (HOSPITAL_COMMUNITY)
Admission: RE | Disposition: A | Discharge status: Home / Self Care | Payer: Self-pay | Source: Ambulatory Visit | Attending: Internal Medicine

## 2011-10-12 ENCOUNTER — Ambulatory Visit (HOSPITAL_COMMUNITY)
Admission: RE | Admit: 2011-10-12 | Discharge: 2011-10-12 | Disposition: A | Discharge status: Home / Self Care | Payer: Medicare Other | Source: Ambulatory Visit | Attending: Internal Medicine | Admitting: Internal Medicine

## 2011-10-12 ENCOUNTER — Encounter (HOSPITAL_COMMUNITY): Payer: Self-pay | Admitting: *Deleted

## 2011-10-12 DIAGNOSIS — K5289 Other specified noninfective gastroenteritis and colitis: Secondary | ICD-10-CM | POA: Insufficient documentation

## 2011-10-12 DIAGNOSIS — I10 Essential (primary) hypertension: Secondary | ICD-10-CM | POA: Insufficient documentation

## 2011-10-12 DIAGNOSIS — Z860100 Personal history of colon polyps, unspecified: Secondary | ICD-10-CM | POA: Insufficient documentation

## 2011-10-12 DIAGNOSIS — D126 Benign neoplasm of colon, unspecified: Secondary | ICD-10-CM | POA: Insufficient documentation

## 2011-10-12 DIAGNOSIS — K648 Other hemorrhoids: Secondary | ICD-10-CM | POA: Insufficient documentation

## 2011-10-12 DIAGNOSIS — K644 Residual hemorrhoidal skin tags: Secondary | ICD-10-CM

## 2011-10-12 HISTORY — DX: Major depressive disorder, single episode, unspecified: F32.9

## 2011-10-12 HISTORY — DX: Depression, unspecified: F32.A

## 2011-10-12 HISTORY — DX: Unspecified osteoarthritis, unspecified site: M19.90

## 2011-10-12 HISTORY — PX: COLONOSCOPY: SHX5424

## 2011-10-12 HISTORY — DX: Shortness of breath: R06.02

## 2011-10-12 SURGERY — COLONOSCOPY
Anesthesia: Moderate Sedation

## 2011-10-12 MED ORDER — MIDAZOLAM HCL 5 MG/5ML IJ SOLN
INTRAMUSCULAR | Status: DC | PRN
  Administered 2011-10-12 (×3): 2 mg via INTRAVENOUS

## 2011-10-12 MED ORDER — SODIUM CHLORIDE 0.45 % IV SOLN
Freq: Once | INTRAVENOUS | Status: AC
  Administered 2011-10-12: 1000 mL via INTRAVENOUS

## 2011-10-12 MED ORDER — MIDAZOLAM HCL 5 MG/5ML IJ SOLN
INTRAMUSCULAR | Status: AC
  Filled 2011-10-12: qty 10

## 2011-10-12 MED ORDER — MEPERIDINE HCL 50 MG/ML IJ SOLN
INTRAMUSCULAR | Status: DC | PRN
  Administered 2011-10-12 (×2): 25 mg via INTRAVENOUS

## 2011-10-12 MED ORDER — STERILE WATER FOR IRRIGATION IR SOLN
Status: DC | PRN
  Administered 2011-10-12: 09:00:00

## 2011-10-12 MED ORDER — MEPERIDINE HCL 50 MG/ML IJ SOLN
INTRAMUSCULAR | Status: AC
  Filled 2011-10-12: qty 1

## 2011-10-12 NOTE — Op Note (Signed)
COLONOSCOPY PROCEDURE REPORT  PATIENT:  Matthew Weiss  MR#:  [id number] Birthdate:  [birth year], 71 y.o., male Endoscopist:  Dr. Malissa Hippo, MD Referred By:  Dr. Catalina Pizza, MD Procedure Date: 10/12/2011  Procedure:   Colonoscopy  Indications:  Patient is 70 year old African American male with history of colonic adenoma. Large adenoma  wasremoved from his ascending colon in July 2007. His last exam was in March 2008. He is here for 7 years colonoscopy.  Informed Consent:  The procedure and risks were reviewed with the patient and informed consent was obtained.  Medications:  Demerol 50 mg IV Versed 6 mg IV  Description of procedure:  After a digital rectal exam was performed, that colonoscope was advanced from the anus through the rectum and colon to the area of the cecum, ileocecal valve and appendiceal orifice. The cecum was deeply intubated. These structures were well-seen and photographed for the record. From the level of the cecum and ileocecal valve, the scope was slowly and cautiously withdrawn. The mucosal surfaces were carefully surveyed utilizing scope tip to flexion to facilitate fold flattening as needed. The scope was pulled down into the rectum where a thorough exam including retroflexion was performed. Terminal ileum was also examined.  Findings:   Prep excellent. Normal terminal ileum. Scar noted at ascending colon site of previous polypectomy. No residual polyp identified. 4 mm  polyp ablated via cold biopsy from mid sigmoid colon. Focal colitis at rectosigmoid junction with linear erosions. Biopsy taken from this area. Normal rectal mucosa. Mall hemorrhoids above and below the dentate line.   Therapeutic/Diagnostic Maneuvers Performed:  See above  Complications:  None  Cecal Withdrawal Time:  19 minutes  Impression:  Normal terminal ileum. Scar at ascending colon site of previous polypectomy without local recurrence. Small polyp ablated via cold biopsy from  sigmoid colon. Focal colitis with erosions at rectosigmoid junction. Biopsy taken. Suspect injury secondary to NSAID therapy. Small internal/external hemorrhoids.  Recommendations:  Standard instructions given. I will contact patient with results of biopsy and further recommendations. Next colonoscopy in 5 years.  REHMAN,NAJEEB U  10/12/2011 9:57 AM  CC: Dr. Dwana Melena, MD & Dr. Bonnetta Barry ref. provider found

## 2011-10-12 NOTE — H&P (Signed)
Matthew Weiss is an 71 y.o. male.   Chief Complaint: Patient is here for colonoscopy. HPI: Patient is 71 year old African male who is in for surveillance colonoscopy. His first exam was in July 2007 with removal of complex polyp from ascending colon. Return for followup exam in March 2008 there was no residual polyp. He feels fine. His bowels move regularly. He denies rectal bleeding. Family history is negative for colorectal carcinoma.  Past Medical History  Diagnosis Date  . Hypertension   . Shortness of breath     exertion  . Arthritis     RA  . Depression     Past Surgical History  Procedure Date  . Back surgery     No family history on file. Social History:  reports that he has quit smoking. He does not have any smokeless tobacco history on file. He reports that he does not drink alcohol or use illicit drugs.  Allergies: No Known Allergies  Medications Prior to Admission  Medication Sig Dispense Refill  . amLODipine (NORVASC) 5 MG tablet Take 5 mg by mouth daily.        Marland Kitchen aspirin EC 81 MG tablet Take 81 mg by mouth daily.      . celecoxib (CELEBREX) 200 MG capsule Take 200 mg by mouth daily as needed. Pain      . Eyelid Cleansers (STERILID EX) Apply 1 application topically daily.      . fish oil-omega-3 fatty acids 1000 MG capsule Take 1 g by mouth daily.      Marland Kitchen ibuprofen (ADVIL,MOTRIN) 200 MG tablet Take 200 mg by mouth every 6 (six) hours as needed. Pain      . losartan (COZAAR) 100 MG tablet Take 100 mg by mouth daily.        . Multiple Vitamin (MULTIVITAMIN WITH MINERALS) TABS Take 1 tablet by mouth daily.      . peg 3350 powder (MOVIPREP) 100 G SOLR Take 1 kit (100 g total) by mouth once.  1 kit  0    No results found for this or any previous visit (from the past 48 hour(s)). No results found.  ROS  Blood pressure 134/80, pulse 71, temperature 97.9 F (36.6 C), temperature source Oral, resp. rate 26, height 5\' 9"  (1.753 m), weight 167 lb (75.751 kg). Physical  Exam  Constitutional: He appears well-developed and well-nourished.  HENT:  Mouth/Throat: Oropharynx is clear and moist.  Eyes: Conjunctivae normal are normal. No scleral icterus.  Neck: No thyromegaly present.  Cardiovascular: Normal rate, regular rhythm and normal heart sounds.   No murmur heard. Respiratory: Effort normal and breath sounds normal.  GI: Soft. He exhibits no distension and no mass. There is no tenderness.  Musculoskeletal: He exhibits no edema.  Lymphadenopathy:    He has no cervical adenopathy.  Neurological: He is alert.  Skin: Skin is warm and dry.     Assessment/Plan History of colonic adenoma. Surveillance colonoscopy.  REHMAN,NAJEEB U 10/12/2011, 9:17 AM

## 2011-10-18 ENCOUNTER — Encounter (HOSPITAL_COMMUNITY): Payer: Self-pay | Admitting: Internal Medicine

## 2011-10-19 ENCOUNTER — Encounter (INDEPENDENT_AMBULATORY_CARE_PROVIDER_SITE_OTHER): Payer: Self-pay | Admitting: *Deleted

## 2012-04-26 ENCOUNTER — Other Ambulatory Visit (HOSPITAL_COMMUNITY)
Admission: RE | Admit: 2012-04-26 | Discharge: 2012-04-26 | Disposition: A | Discharge status: Home / Self Care | Payer: Medicare Other | Source: Ambulatory Visit | Attending: Otolaryngology | Admitting: Otolaryngology

## 2012-04-26 ENCOUNTER — Ambulatory Visit (INDEPENDENT_AMBULATORY_CARE_PROVIDER_SITE_OTHER): Payer: Medicare Other | Admitting: Otolaryngology

## 2012-04-26 DIAGNOSIS — L988 Other specified disorders of the skin and subcutaneous tissue: Secondary | ICD-10-CM | POA: Insufficient documentation

## 2012-04-26 DIAGNOSIS — K148 Other diseases of tongue: Secondary | ICD-10-CM | POA: Insufficient documentation

## 2012-04-26 DIAGNOSIS — D3701 Neoplasm of uncertain behavior of lip: Secondary | ICD-10-CM

## 2012-06-14 ENCOUNTER — Other Ambulatory Visit (HOSPITAL_COMMUNITY): Payer: Self-pay | Admitting: Internal Medicine

## 2012-06-14 DIAGNOSIS — M5410 Radiculopathy, site unspecified: Secondary | ICD-10-CM

## 2012-06-14 DIAGNOSIS — M545 Low back pain: Secondary | ICD-10-CM

## 2012-06-15 ENCOUNTER — Ambulatory Visit (HOSPITAL_COMMUNITY)
Admission: RE | Admit: 2012-06-15 | Discharge: 2012-06-15 | Disposition: A | Discharge status: Home / Self Care | Payer: Medicare Other | Source: Ambulatory Visit | Attending: Internal Medicine | Admitting: Internal Medicine

## 2012-06-15 DIAGNOSIS — M545 Low back pain, unspecified: Secondary | ICD-10-CM | POA: Insufficient documentation

## 2012-06-15 DIAGNOSIS — M5410 Radiculopathy, site unspecified: Secondary | ICD-10-CM

## 2012-06-15 DIAGNOSIS — M5126 Other intervertebral disc displacement, lumbar region: Secondary | ICD-10-CM | POA: Insufficient documentation

## 2012-09-27 ENCOUNTER — Encounter: Payer: Self-pay | Admitting: Physician Assistant

## 2012-09-27 DIAGNOSIS — E785 Hyperlipidemia, unspecified: Secondary | ICD-10-CM | POA: Insufficient documentation

## 2012-09-27 DIAGNOSIS — I428 Other cardiomyopathies: Secondary | ICD-10-CM | POA: Insufficient documentation

## 2012-09-27 DIAGNOSIS — I447 Left bundle-branch block, unspecified: Secondary | ICD-10-CM

## 2012-10-02 ENCOUNTER — Encounter: Payer: Self-pay | Admitting: Cardiovascular Disease

## 2012-10-03 ENCOUNTER — Encounter: Payer: Self-pay | Admitting: Cardiovascular Disease

## 2012-10-03 ENCOUNTER — Ambulatory Visit (INDEPENDENT_AMBULATORY_CARE_PROVIDER_SITE_OTHER): Payer: Medicare Other | Admitting: Cardiovascular Disease

## 2012-10-03 VITALS — BP 130/74 | HR 70 | Resp 20 | Ht 69.0 in | Wt 164.0 lb

## 2012-10-03 DIAGNOSIS — I447 Left bundle-branch block, unspecified: Secondary | ICD-10-CM

## 2012-10-03 DIAGNOSIS — E785 Hyperlipidemia, unspecified: Secondary | ICD-10-CM

## 2012-10-03 DIAGNOSIS — I10 Essential (primary) hypertension: Secondary | ICD-10-CM

## 2012-10-03 DIAGNOSIS — I428 Other cardiomyopathies: Secondary | ICD-10-CM

## 2012-10-03 NOTE — Patient Instructions (Addendum)
Your physician recommends that you schedule a follow-up appointment in: 12 months  Keep up the great work with physical exercise!

## 2012-10-03 NOTE — Assessment & Plan Note (Signed)
Despite a very active lifestyle he does not have any signs or symptoms of congestive heart failure. I wonder whether it be mildly depressed left ventricular systolic function may have been related to left bundle branch block induced dyssynchrony. He is on appropriate treatment with ACE inhibitors and beta blockers.

## 2012-10-03 NOTE — Assessment & Plan Note (Signed)
Narrow complex QRS today. I think he has a rate related left bundle branch block, likely age-related conduction system disease. He has no symptoms or signs of high-grade AV block. No particular intervention or investigation is necessary at this time.

## 2012-10-03 NOTE — Assessment & Plan Note (Signed)
Good blood pressure control on multiple agents. He is congratulated on his commitments to physical fitness and exercise. Reminded about sodium restriction. No changes are made to his medications

## 2012-10-03 NOTE — Progress Notes (Signed)
Patient ID: Matthew Weiss, male   DOB: 05/14/1940, 72 y.o.   MRN: [id number]     Reason for office visit Followup cardiomyopathy, left bundle branch block, hypertension  Matthew Weiss is a remarkably active 72 year old with well treated hypertension and hyperlipidemia and intermittent left bundle branch block. Although he does not have any dyspnea on exertion or other signs of congestive heart failure, his echocardiogram has shown mildly depressed LVEF in the range of 45-50%. Nuclear stress testing in 2010 showed no evidence of ischemia and excellent exercise capacity. It demonstrated rate related left bundle branch block, EF 49%.  He exercises at the [facility] 4 days a week and has not noticed any change in his stamina. He does not have any complaints of dizziness, presyncope or syncope or fatigue    No Known Allergies  Current Outpatient Prescriptions  Medication Sig Dispense Refill  . amLODipine (NORVASC) 5 MG tablet Take 10 mg by mouth daily.       Marland Kitchen aspirin EC 81 MG tablet Take 81 mg by mouth daily.      . Eyelid Cleansers (STERILID EX) Apply 1 application topically daily.      . fish oil-omega-3 fatty acids 1000 MG capsule Take 1 g by mouth daily.      Marland Kitchen ibuprofen (ADVIL,MOTRIN) 200 MG tablet Take 200 mg by mouth every 6 (six) hours as needed. Pain      . lisinopril (PRINIVIL,ZESTRIL) 20 MG tablet Take 20 mg by mouth daily.      . Multiple Vitamin (MULTIVITAMIN WITH MINERALS) TABS Take 1 tablet by mouth daily.      Marland Kitchen BYSTOLIC 10 MG tablet Take 10 mg by mouth daily.       No current facility-administered medications for this visit.    Past Medical History  Diagnosis Date  . Hypertension   . Shortness of breath     exertion  . Arthritis     RA  . Depression   . NICM (nonischemic cardiomyopathy)     Echo 09/22/09, EF 45-50%  . LBBB (left bundle branch block)     Which comes and goes.  . Dyslipidemia     Past Surgical History  Procedure Laterality Date  . Back surgery    .  Colonoscopy  10/12/2011    Procedure: COLONOSCOPY;  Surgeon: Malissa Hippo, MD;  Location: [facility];  Service: Endoscopy;  Laterality: N/A;  930  . Transthoracic echocardiogram  09/22/09    EF 45-50%.  No significant valvular disease.   . Cardiovascular stress test  06/23/2008    Exercise:  normal perfusion. Low risk.  . Cardiac catheterization  09/22/09    Mild non-obstructive CAD    No family history on file.  History   Social History  . Marital Status: Married    Spouse Name: N/A    Number of Children: N/A  . Years of Education: N/A   Occupational History  . Not on file.   Social History Main Topics  . Smoking status: Former Games developer  . Smokeless tobacco: Not on file  . Alcohol Use: No  . Drug Use: No  . Sexual Activity:    Other Topics Concern  . Not on file   Social History Narrative  . No narrative on file    Review of systems: The patient specifically denies any chest pain at rest or with exertion, dyspnea at rest or with exertion, orthopnea, paroxysmal nocturnal dyspnea, syncope, palpitations, focal neurological deficits, intermittent claudication, lower extremity edema, unexplained  weight gain, cough, hemoptysis or wheezing.  The patient also denies abdominal pain, nausea, vomiting, dysphagia, diarrhea, constipation, polyuria, polydipsia, dysuria, hematuria, frequency, urgency, abnormal bleeding or bruising, fever, chills, unexpected weight changes, mood swings, change in skin or hair texture, change in voice quality, auditory or visual problems, allergic reactions or rashes, new musculoskeletal complaints other than usual "aches and pains".   PHYSICAL EXAM BP 130/74  Pulse 70  Resp 20  Ht 5\' 9"  (1.753 m)  Wt 164 lb (74.39 kg)  BMI 24.21 kg/m2  General: Alert, oriented x3, no distress Head: no evidence of trauma, PERRL, EOMI, no exophtalmos or lid lag, no myxedema, no xanthelasma; normal ears, nose and oropharynx Neck: normal jugular venous pulsations  and no hepatojugular reflux; brisk carotid pulses without delay and no carotid bruits Chest: clear to auscultation, no signs of consolidation by percussion or palpation, normal fremitus, symmetrical and full respiratory excursions Cardiovascular: normal position and quality of the apical impulse, regular rhythm, normal first and second heart sounds, no murmurs, rubs or gallops Abdomen: no tenderness or distention, no masses by palpation, no abnormal pulsatility or arterial bruits, normal bowel sounds, no hepatosplenomegaly Extremities: no clubbing, cyanosis or edema; 2+ radial, ulnar and brachial pulses bilaterally; 2+ right femoral, posterior tibial and dorsalis pedis pulses; 2+ left femoral, posterior tibial and dorsalis pedis pulses; no subclavian or femoral bruits Neurological: grossly nonfocal   EKG: NSR, normal tracing with narrow complex QRS  Lipid Panel  May 2014 total cholesterol 153, triglycerides 61, HDL 48, LDL 93  BMET Creatinine 0.8, potassium 4.7, nonfasting glucose of 123   ASSESSMENT AND PLAN LBBB (left bundle branch block),  Intermitent  Narrow complex QRS today. I think he has a rate related left bundle branch block, likely age-related conduction system disease. He has no symptoms or signs of high-grade AV block. No particular intervention or investigation is necessary at this time.  Dyslipidemia Recent perform lipid profile shows values that are all within the desirable range. No changes are made to his medications  NICM (nonischemic cardiomyopathy): last echo 09/22/09.  EF 45-50% Despite a very active lifestyle he does not have any signs or symptoms of congestive heart failure. I wonder whether it be mildly depressed left ventricular systolic function may have been related to left bundle branch block induced dyssynchrony. He is on appropriate treatment with ACE inhibitors and beta blockers.  HTN (hypertension) Good blood pressure control on multiple agents. He is  congratulated on his commitments to physical fitness and exercise. Reminded about sodium restriction. No changes are made to his medications  Orders Placed This Encounter  Procedures  . EKG 12-Lead   Meds ordered this encounter  Medications  . lisinopril (PRINIVIL,ZESTRIL) 20 MG tablet    Sig: Take 20 mg by mouth daily.  Marland Kitchen BYSTOLIC 10 MG tablet    Sig: Take 10 mg by mouth daily.    Junious Silk, MD, [facility] [phone] office [phone] pager

## 2012-10-03 NOTE — Assessment & Plan Note (Signed)
Recent perform lipid profile shows values that are all within the desirable range. No changes are made to his medications

## 2013-01-31 HISTORY — PX: OTHER SURGICAL HISTORY: SHX169

## 2013-04-30 ENCOUNTER — Other Ambulatory Visit (HOSPITAL_COMMUNITY): Payer: Self-pay

## 2013-04-30 DIAGNOSIS — J8410 Pulmonary fibrosis, unspecified: Secondary | ICD-10-CM

## 2013-04-30 LAB — PULMONARY FUNCTION TEST
DL/VA % pred: 125 %
DL/VA: 5.52 ml/min/mmHg/L
DLCO COR % PRED: 54 %
DLCO COR: 15.53 ml/min/mmHg
DLCO UNC: 15.53 ml/min/mmHg
DLCO unc % pred: 54 %
FEF 25-75 POST: 1.69 L/s
FEF 25-75 PRE: 1.2 L/s
FEF2575-%CHANGE-POST: 40 %
FEF2575-%PRED-PRE: 56 %
FEF2575-%Pred-Post: 79 %
FEV1-%CHANGE-POST: 6 %
FEV1-%Pred-Post: 67 %
FEV1-%Pred-Pre: 63 %
FEV1-PRE: 1.59 L
FEV1-Post: 1.7 L
FEV1FVC-%Change-Post: 2 %
FEV1FVC-%PRED-PRE: 100 %
FEV6-%CHANGE-POST: 4 %
FEV6-%PRED-POST: 67 %
FEV6-%Pred-Pre: 65 %
FEV6-POST: 2.16 L
FEV6-Pre: 2.08 L
FEV6FVC-%PRED-PRE: 105 %
FEV6FVC-%Pred-Post: 105 %
FVC-%CHANGE-POST: 4 %
FVC-%PRED-POST: 64 %
FVC-%PRED-PRE: 61 %
FVC-POST: 2.16 L
FVC-Pre: 2.08 L
Post FEV1/FVC ratio: 78 %
Post FEV6/FVC ratio: 100 %
Pre FEV1/FVC ratio: 77 %
Pre FEV6/FVC Ratio: 100 %
RV % pred: 53 %
RV: 1.23 L
TLC % PRED: 51 %
TLC: 3.3 L

## 2013-05-13 ENCOUNTER — Ambulatory Visit (HOSPITAL_COMMUNITY)
Admission: RE | Admit: 2013-05-13 | Discharge: 2013-05-13 | Disposition: A | Discharge status: Home / Self Care | Payer: Medicare HMO | Source: Ambulatory Visit | Attending: Pulmonary Disease | Admitting: Pulmonary Disease

## 2013-05-13 ENCOUNTER — Other Ambulatory Visit (HOSPITAL_COMMUNITY): Payer: Self-pay | Admitting: Pulmonary Disease

## 2013-05-13 DIAGNOSIS — J8410 Pulmonary fibrosis, unspecified: Secondary | ICD-10-CM

## 2013-05-13 DIAGNOSIS — Z87891 Personal history of nicotine dependence: Secondary | ICD-10-CM | POA: Insufficient documentation

## 2013-05-13 DIAGNOSIS — I10 Essential (primary) hypertension: Secondary | ICD-10-CM | POA: Insufficient documentation

## 2013-05-13 DIAGNOSIS — I517 Cardiomegaly: Secondary | ICD-10-CM | POA: Insufficient documentation

## 2013-05-13 MED ORDER — ALBUTEROL SULFATE (2.5 MG/3ML) 0.083% IN NEBU
2.5000 mg | INHALATION_SOLUTION | Freq: Once | RESPIRATORY_TRACT | Status: AC
  Administered 2013-05-13: 2.5 mg via RESPIRATORY_TRACT

## 2013-10-03 ENCOUNTER — Ambulatory Visit (INDEPENDENT_AMBULATORY_CARE_PROVIDER_SITE_OTHER): Payer: Medicare HMO | Admitting: Cardiovascular Disease

## 2013-10-03 ENCOUNTER — Encounter: Payer: Self-pay | Admitting: Cardiovascular Disease

## 2013-10-03 VITALS — BP 122/66 | HR 58 | Resp 16 | Ht 69.0 in | Wt 163.0 lb

## 2013-10-03 DIAGNOSIS — I10 Essential (primary) hypertension: Secondary | ICD-10-CM

## 2013-10-03 DIAGNOSIS — I428 Other cardiomyopathies: Secondary | ICD-10-CM

## 2013-10-03 DIAGNOSIS — E785 Hyperlipidemia, unspecified: Secondary | ICD-10-CM

## 2013-10-03 DIAGNOSIS — I447 Left bundle-branch block, unspecified: Secondary | ICD-10-CM

## 2013-10-03 NOTE — Progress Notes (Signed)
Patient ID: Matthew Weiss, male   DOB: August 26, 1940, 73 y.o.   MRN: [id number]     Reason for office visit Ischemic cardiomyopathy, left bundle branch block, hypertension  Matthew Weiss is now 73 years old and continues to go to the [employer] in [place] 4 days a week. He has not changed his exercise routine at all and feels great. He has mildly depressed left ventricular systolic function (56-43% by echo, 49% by nuclear scintigraphy) but has never had clinical manifestations of congestive heart failure. He has a left bundle branch block that comes and goes depending on heart rate, but he now has a full left bundle branch block with a heart rate of only 58 beats per minute. His blood pressure is well-controlled. He had labs performed earlier this year with Dr. Nevada Crane, and we'll have them repeated again in November, since his glucose and cholesterol was slightly higher than in the past.   No Known Allergies  Current Outpatient Prescriptions  Medication Sig Dispense Refill  . amLODipine (NORVASC) 5 MG tablet Take 10 mg by mouth daily.       Marland Kitchen aspirin EC 81 MG tablet Take 81 mg by mouth daily.      Marland Kitchen BYSTOLIC 10 MG tablet Take 10 mg by mouth daily.      . Eyelid Cleansers (STERILID EX) Apply 1 application topically daily.      . fish oil-omega-3 fatty acids 1000 MG capsule Take 1 g by mouth daily.      Marland Kitchen lisinopril (PRINIVIL,ZESTRIL) 20 MG tablet Take 20 mg by mouth daily.      . Multiple Vitamin (MULTIVITAMIN WITH MINERALS) TABS Take 1 tablet by mouth daily.      . folic acid (FOLVITE) 1 MG tablet Take 1 mg by mouth daily.      . methotrexate (RHEUMATREX) 2.5 MG tablet Take 8 tablets by mouth once a week.       No current facility-administered medications for this visit.    Past Medical History  Diagnosis Date  . Hypertension   . Shortness of breath     exertion  . Arthritis     RA  . Depression   . NICM (nonischemic cardiomyopathy)     Echo 09/22/09, EF 45-50%  . LBBB (left bundle branch  block)     Which comes and goes.  . Dyslipidemia     Past Surgical History  Procedure Laterality Date  . Back surgery    . Colonoscopy  10/12/2011    Procedure: COLONOSCOPY;  Surgeon: Rogene Houston, MD;  Location: [facility];  Service: Endoscopy;  Laterality: N/A;  930  . Transthoracic echocardiogram  09/22/09    EF 45-50%.  No significant valvular disease.   . Cardiovascular stress test  06/23/2008    Exercise:  normal perfusion. Low risk.  . Cardiac catheterization  09/22/09    Mild non-obstructive CAD    No family history on file.  History   Social History  . Marital Status: Married    Spouse Name: N/A    Number of Children: N/A  . Years of Education: N/A   Occupational History  . Not on file.   Social History Main Topics  . Smoking status: Former Research scientist (life sciences)  . Smokeless tobacco: Not on file  . Alcohol Use: No  . Drug Use: No  . Sexual Activity:    Other Topics Concern  . Not on file   Social History Narrative  . No narrative on file  Review of systems: The patient specifically denies any chest pain at rest or with exertion, dyspnea at rest or with exertion, orthopnea, paroxysmal nocturnal dyspnea, syncope, palpitations, focal neurological deficits, intermittent claudication, lower extremity edema, unexplained weight gain, cough, hemoptysis or wheezing.  The patient also denies abdominal pain, nausea, vomiting, dysphagia, diarrhea, constipation, polyuria, polydipsia, dysuria, hematuria, frequency, urgency, abnormal bleeding or bruising, fever, chills, unexpected weight changes, mood swings, change in skin or hair texture, change in voice quality, auditory or visual problems, allergic reactions or rashes, new musculoskeletal complaints other than usual "aches and pains".   PHYSICAL EXAM BP 122/66  Pulse 58  Resp 16  Ht 5' 9"  (1.753 m)  Wt 163 lb (73.936 kg)  BMI 24.06 kg/m2  General: Alert, oriented x3, no distress Head: no evidence of trauma, PERRL, EOMI,  no exophtalmos or lid lag, no myxedema, no xanthelasma; normal ears, nose and oropharynx Neck: normal jugular venous pulsations and no hepatojugular reflux; brisk carotid pulses without delay and no carotid bruits Chest: clear to auscultation, no signs of consolidation by percussion or palpation, normal fremitus, symmetrical and full respiratory excursions Cardiovascular: normal position and quality of the apical impulse, regular rhythm, normal first and paradoxically split second heart sounds, no murmurs, rubs or gallops Abdomen: no tenderness or distention, no masses by palpation, no abnormal pulsatility or arterial bruits, normal bowel sounds, no hepatosplenomegaly Extremities: no clubbing, cyanosis or edema; 2+ radial, ulnar and brachial pulses bilaterally; 2+ right femoral, posterior tibial and dorsalis pedis pulses; 2+ left femoral, posterior tibial and dorsalis pedis pulses; no subclavian or femoral bruits Neurological: grossly nonfocal   EKG: Sinus bradycardia, left bundle branch block, QRS 144 ms, QTC 445 ms  Lipid Panel  May 2014 total cholesterol 153, triglycerides 61, HDL 48, LDL 93   BMET    Component Value Date/Time   NA 141 09/23/2009 0406   K 3.9 09/23/2009 0406   CL 107 09/23/2009 0406   CO2 27 09/23/2009 0406   GLUCOSE 130* 09/23/2009 0406   BUN 8 09/23/2009 0406   CREATININE 0.82 09/23/2009 0406   CALCIUM 9.2 09/23/2009 0406   GFRNONAA >60 09/23/2009 0406   GFRAA  Value: >60        The eGFR has been calculated using the MDRD equation. This calculation has not been validated in all clinical situations. eGFR's persistently <60 mL/min signify possible Chronic Kidney Disease. 09/23/2009 0406     ASSESSMENT AND PLAN LBBB (left bundle branch block), Intermitent  Wide complex QRS today seen at a heart rate of only 58 beats per minutes (narrow complex at 70 beats per minute last year). Likely age-related conduction system disease. He has no symptoms or signs of high-grade AV block.  No particular intervention or investigation is necessary at this time.   Dyslipidemia  My last available lipid profile shows values that are all within the desirable range. Will get the updated results from Dr. Nevada Crane   NICM (nonischemic cardiomyopathy): last echo 09/22/09. EF 45-50%  Despite a very active lifestyle he does not have any signs or symptoms of congestive heart failure. I wonder whether it be mildly depressed left ventricular systolic function may have been related to left bundle branch block induced dyssynchrony. He is on appropriate treatment with ACE inhibitors and beta blockers.   HTN (hypertension)  Good blood pressure control on multiple agents. He is congratulated on his commitments to physical fitness and exercise.  Orders Placed This Encounter  Procedures  . EKG 12-Lead   Meds ordered  this encounter  Medications  . methotrexate (RHEUMATREX) 2.5 MG tablet    Sig: Take 8 tablets by mouth once a week.  . folic acid (FOLVITE) 1 MG tablet    Sig: Take 1 mg by mouth daily.    Holli Humbles, MD, [place] [phone] office [phone] pager

## 2013-10-03 NOTE — Patient Instructions (Signed)
Your physician wants you to follow-up in: One year with Dr. Sallyanne Kuster.   You will receive a reminder letter in the mail two months in advance. If you don't receive a letter, please call our office to schedule the follow-up appointment.

## 2014-05-02 ENCOUNTER — Other Ambulatory Visit (HOSPITAL_COMMUNITY): Payer: Self-pay | Admitting: Radiology

## 2014-05-02 DIAGNOSIS — J8410 Pulmonary fibrosis, unspecified: Secondary | ICD-10-CM

## 2014-05-14 ENCOUNTER — Ambulatory Visit (HOSPITAL_COMMUNITY)
Admission: RE | Admit: 2014-05-14 | Discharge: 2014-05-14 | Disposition: A | Discharge status: Home / Self Care | Payer: Commercial Managed Care - HMO | Source: Ambulatory Visit | Attending: Pulmonary Disease | Admitting: Pulmonary Disease

## 2014-05-14 DIAGNOSIS — F17200 Nicotine dependence, unspecified, uncomplicated: Secondary | ICD-10-CM | POA: Diagnosis not present

## 2014-05-14 DIAGNOSIS — J8410 Pulmonary fibrosis, unspecified: Secondary | ICD-10-CM | POA: Diagnosis not present

## 2014-05-14 DIAGNOSIS — R0600 Dyspnea, unspecified: Secondary | ICD-10-CM | POA: Insufficient documentation

## 2014-05-14 DIAGNOSIS — R05 Cough: Secondary | ICD-10-CM | POA: Insufficient documentation

## 2014-05-14 LAB — PULMONARY FUNCTION TEST
DL/VA % PRED: 124 %
DL/VA: 5.58 ml/min/mmHg/L
DLCO UNC: 16.27 ml/min/mmHg
DLCO unc % pred: 54 %
FEF 25-75 POST: 1.45 L/s
FEF 25-75 Pre: 1.05 L/sec
FEF2575-%CHANGE-POST: 38 %
FEF2575-%PRED-PRE: 48 %
FEF2575-%Pred-Post: 66 %
FEV1-%Change-Post: 3 %
FEV1-%PRED-PRE: 63 %
FEV1-%Pred-Post: 66 %
FEV1-PRE: 1.66 L
FEV1-Post: 1.72 L
FEV1FVC-%Change-Post: 4 %
FEV1FVC-%PRED-PRE: 98 %
FEV6-%CHANGE-POST: 0 %
FEV6-%PRED-POST: 66 %
FEV6-%Pred-Pre: 66 %
FEV6-POST: 2.2 L
FEV6-Pre: 2.21 L
FEV6FVC-%Pred-Post: 105 %
FEV6FVC-%Pred-Pre: 105 %
FVC-%CHANGE-POST: 0 %
FVC-%PRED-POST: 63 %
FVC-%PRED-PRE: 63 %
FVC-POST: 2.2 L
FVC-Pre: 2.21 L
POST FEV1/FVC RATIO: 78 %
POST FEV6/FVC RATIO: 100 %
PRE FEV1/FVC RATIO: 75 %
Pre FEV6/FVC Ratio: 100 %
RV % pred: 72 %
RV: 1.74 L
TLC % pred: 58 %
TLC: 3.89 L

## 2014-05-14 MED ORDER — ALBUTEROL SULFATE (2.5 MG/3ML) 0.083% IN NEBU
2.5000 mg | INHALATION_SOLUTION | Freq: Once | RESPIRATORY_TRACT | Status: AC
  Administered 2014-05-14: 2.5 mg via RESPIRATORY_TRACT

## 2014-10-30 ENCOUNTER — Ambulatory Visit (INDEPENDENT_AMBULATORY_CARE_PROVIDER_SITE_OTHER): Payer: Commercial Managed Care - HMO | Admitting: Cardiovascular Disease

## 2014-10-30 ENCOUNTER — Encounter: Payer: Self-pay | Admitting: Cardiovascular Disease

## 2014-10-30 VITALS — BP 122/62 | HR 52 | Ht 68.0 in | Wt 162.0 lb

## 2014-10-30 DIAGNOSIS — I429 Cardiomyopathy, unspecified: Secondary | ICD-10-CM

## 2014-10-30 DIAGNOSIS — E785 Hyperlipidemia, unspecified: Secondary | ICD-10-CM

## 2014-10-30 DIAGNOSIS — I447 Left bundle-branch block, unspecified: Secondary | ICD-10-CM | POA: Diagnosis not present

## 2014-10-30 DIAGNOSIS — I428 Other cardiomyopathies: Secondary | ICD-10-CM

## 2014-10-30 DIAGNOSIS — I10 Essential (primary) hypertension: Secondary | ICD-10-CM

## 2014-10-30 MED ORDER — NEBIVOLOL HCL 5 MG PO TABS: 5.0000 mg | ORAL_TABLET | Freq: Every day | ORAL | Status: DC

## 2014-10-30 MED ORDER — LISINOPRIL 40 MG PO TABS: 40.0000 mg | ORAL_TABLET | Freq: Every day | ORAL | Status: DC

## 2014-10-30 NOTE — Progress Notes (Signed)
Patient ID: Matthew Weiss, male   DOB:  05, 1942, 74 y.o.   MRN: [id number]     Cardiology Office Note   Date:  10/30/2014   ID:  Matthew Weiss, DOB 01-24-1941, MRN [id number]  PCP:  Wende Neighbors, MD  Cardiologist:   Sanda Klein, MD   Chief Complaint  Patient presents with  . Annual Exam    Patient has no complaints.      History of Present Illness: Matthew Weiss is a 74 y.o. male who presents for  Hypertension, nonischemic cardiomyopathy, left bundle branch block.  Mikias continues exercise 4 days a week at the [facility] and has not curtailed his exercise regimen or noticed any reduction in stamina. He denies exertional dyspnea and he has not experienced dizziness or syncope. He has not been troubled by lower extremity edema and has never complained of chest pain.  He is to have a rate related left bundle branch block but this now appears to be a permanent conduction abnormality and his heart rate today is only 52 bpm (compared to 58 bpm a year ago). He has mild nonischemic cardiomyopathy with an ejection fraction that was most recently estimated at 45-50 percent. He takes ace inhibitors and beta blockers. He has hyperlipidemia and his lipids are followed by Dr. Nevada Crane. He also has rheumatoid arthritis and has had a very good response to chronic treatment with methotrexate. Unfortunately, Dr. Ouida Sills , his rheumatologist, is no longer seeing patients.    Past Medical History  Diagnosis Date  . Hypertension   . Shortness of breath     exertion  . Arthritis     RA  . Depression   . NICM (nonischemic cardiomyopathy)     Echo 09/22/09, EF 45-50%  . LBBB (left bundle branch block)     Which comes and goes.  . Dyslipidemia     Past Surgical History  Procedure Laterality Date  . Back surgery    . Colonoscopy  10/12/2011    Procedure: COLONOSCOPY;  Surgeon: Rogene Houston, MD;  [facility];  Service: Endoscopy;  Laterality: N/A;  930  . Transthoracic echocardiogram  09/22/09      EF 45-50%.  No significant valvular disease.   . Cardiovascular stress test  06/23/2008    Exercise:  normal perfusion. Low risk.  . Cardiac catheterization  09/22/09    Mild non-obstructive CAD     Current Outpatient Prescriptions  Medication Sig Dispense Refill  . amLODipine (NORVASC) 5 MG tablet Take 10 mg by mouth daily.     Marland Kitchen aspirin EC 81 MG tablet Take 81 mg by mouth daily.    . Eyelid Cleansers (STERILID EX) Apply 1 application topically daily.    . folic acid (FOLVITE) 1 MG tablet Take 1 mg by mouth daily.    Marland Kitchen lisinopril (PRINIVIL,ZESTRIL) 40 MG tablet Take 1 tablet (40 mg total) by mouth daily. 30 tablet 11  . methotrexate (RHEUMATREX) 2.5 MG tablet Take 8 tablets by mouth once a week.    . nebivolol (BYSTOLIC) 5 MG tablet Take 1 tablet (5 mg total) by mouth daily. 30 tablet 11   No current facility-administered medications for this visit.    Allergies:   Review of patient's allergies indicates no known allergies.    Social History:  The patient  reports that he has quit smoking. He does not have any smokeless tobacco history on file. He reports that he does not drink alcohol or use illicit drugs.     ROS:  Please see the history of present illness.     some inflammation at the base of the left thumb with joint stiffness Otherwise, review of systems positive for none.   All other systems are reviewed and negative.    PHYSICAL EXAM: VS:  BP 122/62 mmHg  Pulse 52  Ht 5\' 8"  (1.727 m)  Wt 162 lb (73.483 kg)  BMI 24.64 kg/m2 , BMI Body mass index is 24.64 kg/(m^2).  General: Alert, oriented x3, no distress Head: no evidence of trauma, PERRL, EOMI, no exophtalmos or lid lag, no myxedema, no xanthelasma; normal ears, nose and oropharynx Neck: normal jugular venous pulsations and no hepatojugular reflux; brisk carotid pulses without delay and no carotid bruits Chest: clear to auscultation, no signs of consolidation by percussion or palpation, normal fremitus,  symmetrical and full respiratory excursions Cardiovascular: normal position and quality of the apical impulse, regular rhythm, normal first and  Paradoxically splitsecond heart sounds, no murmurs, rubs or gallops Abdomen: no tenderness or distention, no masses by palpation, no abnormal pulsatility or arterial bruits, normal bowel sounds, no hepatosplenomegaly Extremities: no clubbing, cyanosis or edema; 2+ radial, ulnar and brachial pulses bilaterally; 2+ right femoral, posterior tibial and dorsalis pedis pulses; 2+ left femoral, posterior tibial and dorsalis pedis pulses; no subclavian or femoral bruits Neurological: grossly nonfocal Psych: euthymic mood, full affect   EKG:  EKG is ordered today. The ekg ordered today demonstrates  Sinus bradycardia with left bundle branch block (QRS 146 , QTC 446 ms ).   Recent Labs: No results found for requested labs within last 365 days.    Lipid Panel    Component Value Date/Time   CHOL  09/23/2009 0406    109        ATP III CLASSIFICATION:  <200     mg/dL   Desirable  200-239  mg/dL   Borderline High  >=240    mg/dL   High          TRIG 28 09/23/2009 0406   HDL 45 09/23/2009 0406   CHOLHDL 2.4 09/23/2009 0406   VLDL 6 09/23/2009 0406   LDLCALC  09/23/2009 0406    58        Total Cholesterol/HDL:CHD Risk Coronary Heart Disease Risk Table                     Men   Women  1/2 Average Risk   3.4   3.3  Average Risk       5.0   4.4  2 X Average Risk   9.6   7.1  3 X Average Risk  23.4   11.0        Use the calculated Patient Ratio above and the CHD Risk Table to determine the patient's CHD Risk.        ATP III CLASSIFICATION (LDL):  <100     mg/dL   Optimal  100-129  mg/dL   Near or Above                    Optimal  130-159  mg/dL   Borderline  160-189  mg/dL   High  >190     mg/dL   Very High      Wt Readings from Last 3 Encounters:  10/30/14 162 lb (73.483 kg)  10/03/13 163 lb (73.936 kg)  10/03/12 164 lb (74.39 kg)       ASSESSMENT AND PLAN:  1.  Sinus bradycardia and left bundle branch  block  These are likely age-related abnormalities and are asymptomatic. Nevertheless I think it is safer to reduce the dose of beta blocker to only 5 mg daily. Will increase his ACE inhibitor to 40 mg daily to compensate for the effect on his blood pressure.  pacemaker is not indicated as long as he is asymptomatic  2.  Mild nonischemic cardiomyopathy  No clinical signs or symptoms of congestive heart failure. He appears euvolemic and has not required diuretic therapy  3.  Hyperlipidemia  Followed by Dr. Nevada Crane , who is also monitoring his liver function tests while he is on methotrexate.  4.  Essential hypertension  Excellent control on multiple agents. He has a follow-up appointment with Dr. Nevada Crane in November and I will be a good opportunity to make sure that the changes are made today I have let to an appropriate result.    Current medicines are reviewed at length with the patient today.  The patient does not have concerns regarding medicines.  Labs/ tests ordered today include:   Orders Placed This Encounter  Procedures  . EKG 12-Lead    Patient Instructions  Medication Instructions:  INCREASE Lisinopril to 40 mg.  DECREASE Bystolic to 5 mg.  New prescriptions have been sent to your pharmacy electronically with new instructions.  Labwork: NONE  Testing/Procedures: NONE  Follow-Up: Dr Sallyanne Kuster recommends that you schedule a follow-up appointment in 1 year. You will receive a reminder letter in the mail two months in advance. If you don't receive a letter, please call our office to schedule the follow-up appointment.      Mikael Spray, MD  10/30/2014 11:56 AM    Sanda Klein, MD, [facility] [phone] office [phone] pager

## 2014-10-30 NOTE — Patient Instructions (Signed)
Medication Instructions:  INCREASE Lisinopril to 40 mg.  DECREASE Bystolic to 5 mg.  New prescriptions have been sent to your pharmacy electronically with new instructions.  Labwork: NONE  Testing/Procedures: NONE  Follow-Up: Dr Sallyanne Kuster recommends that you schedule a follow-up appointment in 1 year. You will receive a reminder letter in the mail two months in advance. If you don't receive a letter, please call our office to schedule the follow-up appointment.

## 2015-07-17 ENCOUNTER — Ambulatory Visit (HOSPITAL_COMMUNITY)
Admission: RE | Admit: 2015-07-17 | Discharge: 2015-07-17 | Disposition: A | Discharge status: Home / Self Care | Payer: Commercial Managed Care - HMO | Source: Ambulatory Visit | Attending: Adult Health Nurse Practitioner | Admitting: Adult Health Nurse Practitioner

## 2015-07-17 ENCOUNTER — Other Ambulatory Visit (HOSPITAL_COMMUNITY): Payer: Self-pay | Admitting: Adult Health Nurse Practitioner

## 2015-07-17 DIAGNOSIS — X58XXXA Exposure to other specified factors, initial encounter: Secondary | ICD-10-CM | POA: Diagnosis not present

## 2015-07-17 DIAGNOSIS — S90852A Superficial foreign body, left foot, initial encounter: Secondary | ICD-10-CM | POA: Diagnosis present

## 2015-09-29 ENCOUNTER — Other Ambulatory Visit: Payer: Self-pay | Admitting: Cardiovascular Disease

## 2015-09-29 NOTE — Telephone Encounter (Signed)
Rx request sent to pharmacy.  

## 2015-10-30 ENCOUNTER — Ambulatory Visit (INDEPENDENT_AMBULATORY_CARE_PROVIDER_SITE_OTHER): Payer: Commercial Managed Care - HMO | Admitting: Cardiovascular Disease

## 2015-10-30 ENCOUNTER — Encounter: Payer: Self-pay | Admitting: Cardiovascular Disease

## 2015-10-30 VITALS — BP 122/64 | HR 55 | Ht 68.0 in | Wt 159.8 lb

## 2015-10-30 DIAGNOSIS — I428 Other cardiomyopathies: Secondary | ICD-10-CM

## 2015-10-30 DIAGNOSIS — E785 Hyperlipidemia, unspecified: Secondary | ICD-10-CM | POA: Diagnosis not present

## 2015-10-30 DIAGNOSIS — I429 Cardiomyopathy, unspecified: Secondary | ICD-10-CM

## 2015-10-30 DIAGNOSIS — I10 Essential (primary) hypertension: Secondary | ICD-10-CM | POA: Diagnosis not present

## 2015-10-30 DIAGNOSIS — I447 Left bundle-branch block, unspecified: Secondary | ICD-10-CM | POA: Diagnosis not present

## 2015-10-30 MED ORDER — CARVEDILOL 6.25 MG PO TABS: 6.2500 mg | ORAL_TABLET | Freq: Two times a day (BID) | ORAL | 11 refills | Status: DC

## 2015-10-30 NOTE — Patient Instructions (Signed)
Dr Sallyanne Kuster has recommended making the following medication changes: 1. STOP Bystolic 2. START Carvedilol 6.25 mg - take 1 tablet by mouth twice daily  Your physician has requested that you regularly monitor and record your blood pressure readings at home. Please call the office to speak with a nurse to report these blood pressure readings.  Dr Sallyanne Kuster recommends that you schedule a follow-up appointment in 1 year. You will receive a reminder letter in the mail two months in advance. If you don't receive a letter, please call our office to schedule the follow-up appointment.  If you need a refill on your cardiac medications before your next appointment, please call your pharmacy.

## 2015-10-30 NOTE — Progress Notes (Signed)
Cardiology Office Note    Date:  10/30/2015   ID:  Matthew Weiss, DOB [birth year], MRN [id number]  PCP:  Wende Neighbors, MD  Cardiologist:   Sanda Klein, MD   Chief Complaint  Patient presents with  . Follow-up    History of Present Illness:  Matthew Weiss is a 75 y.o. male with mild nonischemic cardiomyopathy, rate related left bundle branch block, hypertension returning for follow-up. He feels well. He continues to walk roughly 4 miles per day. He has that she started working again. He works in a [employer] and drives a school bus, but he is having a hard time putting up with the teenagers he has to drive. He started working and in part because both of his daughters are in nursing school and need financial help. He denies problems with syncope, palpitations, dizziness, leg edema, exertional dyspnea, orthopnea or PND.  It had seemed that his left bundle branch block had become a permanent abnormality, but his ECG today shows narrow complex QRS with a heart rate of 55 bpm. He has sinus bradycardia and a fairly first-degree A-V block (PR 212 ms, QRS 96 ms)  He has mildly depressed left ventricular systolic function with an ejection fraction of 45-50% and is taking both ace inhibitors and beta blockers. He worries about the cost of nebivolol. He continues to take methotrexate for rheumatoid arthritis.   Past Medical History:  Diagnosis Date  . Arthritis    RA  . Depression   . Dyslipidemia   . Hypertension   . LBBB (left bundle branch block)    Which comes and goes.  Marland Kitchen NICM (nonischemic cardiomyopathy) (Matthew Weiss)    Echo 09/22/09, EF 45-50%  . Shortness of breath    exertion    Past Surgical History:  Procedure Laterality Date  . BACK SURGERY    . CARDIAC CATHETERIZATION  09/22/09   Mild non-obstructive CAD  . CARDIOVASCULAR STRESS TEST  06/23/2008   Exercise:  normal perfusion. Low risk.  . COLONOSCOPY  10/12/2011   Procedure: COLONOSCOPY;  Surgeon: Rogene Houston, MD;   [facility];  Service: Endoscopy;  Laterality: N/A;  930  . TRANSTHORACIC ECHOCARDIOGRAM  09/22/09   EF 45-50%.  No significant valvular disease.     Current Medications: Outpatient Medications Prior to Visit  Medication Sig Dispense Refill  . amLODipine (NORVASC) 5 MG tablet Take 10 mg by mouth daily.     Marland Kitchen aspirin EC 81 MG tablet Take 81 mg by mouth daily.    . Eyelid Cleansers (STERILID EX) Apply 1 application topically daily.    . folic acid (FOLVITE) 1 MG tablet Take 1 mg by mouth daily.    Marland Kitchen lisinopril (PRINIVIL,ZESTRIL) 40 MG tablet TAKE 1 TABLET EVERY DAY 90 tablet 3  . methotrexate (RHEUMATREX) 2.5 MG tablet Take 8 tablets by mouth once a week.    . nebivolol (BYSTOLIC) 5 MG tablet Take 1 tablet (5 mg total) by mouth daily. 30 tablet 11   No facility-administered medications prior to visit.      Allergies:   Review of patient's allergies indicates no known allergies.   Social History   Social History  . Marital status: Married    Spouse name: N/A  . Number of children: N/A  . Years of education: N/A   Social History Main Topics  . Smoking status: Former Research scientist (life sciences)  . Smokeless tobacco: None  . Alcohol use No  . Drug use: No  . Sexual activity: Not  Asked   Other Topics Concern  . None   Social History Narrative  . None      ROS:   Please see the history of present illness.    ROS All other systems reviewed and are negative.   PHYSICAL EXAM:   VS:  BP 122/64 (BP Location: Right Arm, Patient Position: Sitting, Cuff Size: Normal)   Pulse (!) 55   Ht 5\' 8"  (1.727 m)   Wt 72.5 kg (159 lb 12.8 oz)   SpO2 99%   BMI 24.30 kg/m    GEN: Well nourished, well developed, in no acute distress  HEENT: normal  Neck: no JVD, carotid bruits, or masses Cardiac: RRR; no murmurs, rubs, or gallops,no edema  Respiratory:  clear to auscultation bilaterally, normal work of breathing GI: soft, nontender, nondistended, + BS MS: no deformity or atrophy  Skin: warm  and dry, no rash Neuro:  Alert and Oriented x 3, Strength and sensation are intact Psych: euthymic mood, full affect  Wt Readings from Last 3 Encounters:  10/30/15 72.5 kg (159 lb 12.8 oz)  10/30/14 73.5 kg (162 lb)  10/03/13 73.9 kg (163 lb)      Studies/Labs Reviewed:   EKG:  EKG is ordered today.  The ekg ordered today demonstrates Sinus rhythm, mild first-degree AV block, narrow QRS complex 96 ms, no repolarization abnormalities  Recent Labs: No results found for requested labs within last 8760 hours.   Lipid Panel    Component Value Date/Time   CHOL  09/23/2009 0406    109        ATP III CLASSIFICATION:  <200     mg/dL   Desirable  200-239  mg/dL   Borderline High  >=240    mg/dL   High          TRIG 28 09/23/2009 0406   HDL 45 09/23/2009 0406   CHOLHDL 2.4 09/23/2009 0406   VLDL 6 09/23/2009 0406   LDLCALC  09/23/2009 0406    58        Total Cholesterol/HDL:CHD Risk Coronary Heart Disease Risk Table                     Men   Women  1/2 Average Risk   3.4   3.3  Average Risk       5.0   4.4  2 X Average Risk   9.6   7.1  3 X Average Risk  23.4   11.0        Use the calculated Patient Ratio above and the CHD Risk Table to determine the patient's CHD Risk.        ATP III CLASSIFICATION (LDL):  <100     mg/dL   Optimal  100-129  mg/dL   Near or Above                    Optimal  130-159  mg/dL   Borderline  160-189  mg/dL   High  >190     mg/dL   Very High      ASSESSMENT:    1. NICM (nonischemic cardiomyopathy): last echo 09/22/09.  EF 45-50%   2. LBBB (left bundle branch block),  Intermitent       PLAN:  In order of problems listed above:  1. CMP: He has mild left ventricular systolic dysfunction but no signs or symptoms of congestive heart failure. Continue ace inhibitors and beta blockers. We'll try to switch to cheaper  generic carvedilol. Euvolemic. He does not require diuretic therapy. 2. HTN: Well controlled 3. rate related LBBB:  Asymptomatic 4. HLP: Monitor for hepatotoxicity while on treatment with statin and methotrexate. Labs performed by Dr. Nevada Crane.    Medication Adjustments/Labs and Tests Ordered: Current medicines are reviewed at length with the patient today.  Concerns regarding medicines are outlined above.  Medication changes, Labs and Tests ordered today are listed in the Patient Instructions below. Patient Instructions  Dr Sallyanne Kuster has recommended making the following medication changes: 1. STOP Bystolic 2. START Carvedilol 6.25 mg - take 1 tablet by mouth twice daily  Your physician has requested that you regularly monitor and record your blood pressure readings at home. Please call the office to speak with a nurse to report these blood pressure readings.  Dr Sallyanne Kuster recommends that you schedule a follow-up appointment in 1 year. You will receive a reminder letter in the mail two months in advance. If you don't receive a letter, please call our office to schedule the follow-up appointment.  If you need a refill on your cardiac medications before your next appointment, please call your pharmacy.    Signed, Sanda Klein, MD  10/30/2015 1:49 PM    [facility] [address] [place], [place], [place]  [address] Phone: [phone]; Fax: [phone]

## 2015-12-14 ENCOUNTER — Telehealth: Payer: Self-pay | Admitting: Cardiovascular Disease

## 2015-12-14 NOTE — Telephone Encounter (Signed)
Line rings busy  

## 2015-12-14 NOTE — Telephone Encounter (Signed)
Matthew Weiss is calling in reference to the change in one of his blood pressure medication and they wanted him to record his readings for two weeks and he is calling to give the reading.  Please call  . Thanks

## 2015-12-14 NOTE — Telephone Encounter (Signed)
I agree: BP great, keep same meds MCr

## 2015-12-14 NOTE — Telephone Encounter (Signed)
Patient gave verbal OK to discuss w wife.  She gave me readings on his BPs since provider-led med changes:  123/56  125/61  134/65  129/65  127/65 126/63  118/60 (AM) 113/60  123/62 133/68  129/62  134/67  111/59  120/62  130/64 136/68  132/66  132/68 137/68  134/65  135/70  113/59 124/61  I advised that these BP look good - did not anticipate Dr. Sallyanne Kuster needing to change dosages but would forward for review and contact them with recommendations.  Phone was cut off toward end of conversation. I believe my recommendations were given in full, but I did attempt to reach back out to patient at home x2 -- dialtone was busy in each instance.

## 2015-12-15 NOTE — Telephone Encounter (Signed)
Left msg to call.

## 2016-02-19 DIAGNOSIS — B3749 Other urogenital candidiasis: Secondary | ICD-10-CM | POA: Diagnosis not present

## 2016-03-02 ENCOUNTER — Encounter: Payer: Self-pay | Admitting: Internal Medicine

## 2016-03-15 DIAGNOSIS — R7301 Impaired fasting glucose: Secondary | ICD-10-CM | POA: Diagnosis not present

## 2016-03-15 DIAGNOSIS — I10 Essential (primary) hypertension: Secondary | ICD-10-CM | POA: Diagnosis not present

## 2016-03-15 DIAGNOSIS — E782 Mixed hyperlipidemia: Secondary | ICD-10-CM | POA: Diagnosis not present

## 2016-03-17 ENCOUNTER — Ambulatory Visit (INDEPENDENT_AMBULATORY_CARE_PROVIDER_SITE_OTHER): Payer: PPO | Admitting: Podiatry

## 2016-03-17 ENCOUNTER — Ambulatory Visit (INDEPENDENT_AMBULATORY_CARE_PROVIDER_SITE_OTHER): Payer: PPO

## 2016-03-17 VITALS — Resp 16 | Ht 69.0 in | Wt 162.0 lb

## 2016-03-17 DIAGNOSIS — H409 Unspecified glaucoma: Secondary | ICD-10-CM | POA: Diagnosis not present

## 2016-03-17 DIAGNOSIS — L600 Ingrowing nail: Secondary | ICD-10-CM | POA: Diagnosis not present

## 2016-03-17 DIAGNOSIS — M069 Rheumatoid arthritis, unspecified: Secondary | ICD-10-CM | POA: Diagnosis not present

## 2016-03-17 DIAGNOSIS — R7301 Impaired fasting glucose: Secondary | ICD-10-CM | POA: Diagnosis not present

## 2016-03-17 DIAGNOSIS — M2010 Hallux valgus (acquired), unspecified foot: Secondary | ICD-10-CM | POA: Diagnosis not present

## 2016-03-17 DIAGNOSIS — I10 Essential (primary) hypertension: Secondary | ICD-10-CM | POA: Diagnosis not present

## 2016-03-17 DIAGNOSIS — I429 Cardiomyopathy, unspecified: Secondary | ICD-10-CM | POA: Diagnosis not present

## 2016-03-17 DIAGNOSIS — Z6824 Body mass index (BMI) 24.0-24.9, adult: Secondary | ICD-10-CM | POA: Diagnosis not present

## 2016-03-17 NOTE — Progress Notes (Signed)
   Subjective:    Patient ID: Tag Woitas, male    DOB: [birth year], 76 y.o.   MRN: [id number]  HPI  Chief Complaint  Patient presents with  . Nail Problem    Right foot; 2nd & 4th toe nails are painful when wearing shoes, pt states that the "nails grown down into the skin and his unable to clip the nails, he would like them removed"  . Toe Pain    Left; Pt states that the he had a bunion removed 3 years ago and the toes are crooked and painful.   . Callouses    Left; Plantar forefoot; Lateral side.        Review of Systems     Objective:   Physical Exam        Assessment & Plan:

## 2016-03-17 NOTE — Patient Instructions (Addendum)

## 2016-03-18 ENCOUNTER — Ambulatory Visit: Payer: Self-pay | Admitting: Podiatry

## 2016-03-19 NOTE — Progress Notes (Signed)
Subjective:     Patient ID: Matthew Weiss, male   DOB: 01/13/1941, 76 y.o.   MRN: [id number]  HPI patient presents stating he has to very painful nails on the right foot and has had structural bunion correction left which is not satisfactory with the left hallux deviating and elevation of the lesser digits. States it's get irritating makes it hard for him to walk and wear shoe gear comfortably   Review of Systems  All other systems reviewed and are negative.      Objective:   Physical Exam  Constitutional: He is oriented to person, place, and time.  Cardiovascular: Intact distal pulses.   Musculoskeletal: Normal range of motion.  Neurological: He is oriented to person, place, and time.  Skin: Skin is warm.  Nursing note and vitals reviewed.  neurovascular status intact muscle strength was adequate range of motion within normal limits with patient found to have structural abnormality of the left foot with deviation of the hallux previous incision around the first metatarsal good range of motion and elevation of the lesser digits that are moderately painful. Patient's found have good digital perfusion is well oriented 3 on the right foot is noted to have thickened second and fourth nails which are incurvated and painful     Assessment:     HAV with hallux interphalangeus left and digital deformity second and third toes and damaged second and fourth nails right    Plan:     H&P both conditions discussed and he wants to have the right foot fixed which would require Akin osteotomy digital fusions and no expectations as it would be revisional. He understands this and wants to have this done in June and today we focused on the right foot and I infiltrated the right second and fourth toe 60 Milligan times like Marcaine mixture remove the nails exposed matrix and applied phenol for applications 30 seconds followed by alcohol lavage and sterile dressing. Given instructions on soaks and  reappoint  X-ray report indicate structural deformity of the left over right foot with deviation of the hallux under the second toe and also elevation of the lesser digits

## 2016-04-28 DIAGNOSIS — K1321 Leukoplakia of oral mucosa, including tongue: Secondary | ICD-10-CM | POA: Diagnosis not present

## 2016-05-04 DIAGNOSIS — K1321 Leukoplakia of oral mucosa, including tongue: Secondary | ICD-10-CM | POA: Diagnosis not present

## 2016-07-08 ENCOUNTER — Telehealth: Payer: Self-pay | Admitting: *Deleted

## 2016-07-08 NOTE — Telephone Encounter (Signed)
I am returning your call.  Please change the code for the [facility] Osteotomy to M20.10 which is Hallux Abducto Valgus.  The code for the Hammer Toe is correct.  "That is all I needed thank you."

## 2016-07-08 NOTE — Telephone Encounter (Signed)
I have a question about the authorization that was sent over the diagnosis code is not clear.  Please give me a call."

## 2016-07-11 ENCOUNTER — Ambulatory Visit (INDEPENDENT_AMBULATORY_CARE_PROVIDER_SITE_OTHER): Payer: PPO | Admitting: Podiatry

## 2016-07-11 ENCOUNTER — Encounter: Payer: Self-pay | Admitting: Podiatry

## 2016-07-11 DIAGNOSIS — M2042 Other hammer toe(s) (acquired), left foot: Secondary | ICD-10-CM | POA: Diagnosis not present

## 2016-07-11 DIAGNOSIS — M2010 Hallux valgus (acquired), unspecified foot: Secondary | ICD-10-CM | POA: Diagnosis not present

## 2016-07-11 NOTE — Progress Notes (Signed)
Subjective:    Patient ID: Matthew Weiss, male   DOB: 76 y.o.   MRN: [id number]   HPI patient presents for correction of his left foot to go over the consent form and is scheduled for surgery in the next several weeks. States that he has a lot of pain in the second and third toe between the 2 toes and between the big toe and the second toe. Also complains of a bunion deformity on the left first metatarsal that had been corrected years ago but is giving him trouble and states she's tried wider shoes and tried other modalities without relief    ROS      Objective:  Physical Exam neurovascular status intact negative Homans sign was noted with patient found to have structural bunion deformity left deviation the hallux against the second toe left and deviation second toe against the third toe left foot     Assessment:    H&P conditions reviewed and at this point due to long-standing nature I recommended structural McBride bunionectomy along with Akin osteotomy digital fusion digits to 3 of both feet     Plan:    I reviewed conditions at great length and allow patient to read consent form going over alternative treatments complications associated with this. I explained that this is revisional surgery and that does make it more difficult and that there is more postoperative risk associated with this and at this point I did dispense air fracture walker as I do think he'll need this postoperative due to the fact that he's having osteotomy cut and it is revisional and he is of advanced age. He will start using the boot at this time and I want him to develop a second shoe for his other foot that matches the boot so that he's comfortable during gait and does not develop near hip problems also I did allow him to sign the consent form after I went over alternative treatments complications and the fact that total recovery is 6 months to one year

## 2016-07-11 NOTE — Patient Instructions (Signed)
Pre-Operative Instructions  Congratulations, you have decided to take an important step to improving your quality of life.  You can be assured that the doctors of [facility] will be with you every step of the way.  1. Plan to be at the surgery center/hospital at least 1 (one) hour prior to your scheduled time unless otherwise directed by the surgical center/hospital staff.  You must have a responsible adult accompany you, remain during the surgery and drive you home.  Make sure you have directions to the surgical center/hospital and know how to get there on time. 2. For hospital based surgery you will need to obtain a history and physical form from your family physician within 1 month prior to the date of surgery- we will give you a form for you primary physician.  3. We make every effort to accommodate the date you request for surgery.  There are however, times where surgery dates or times have to be moved.  We will contact you as soon as possible if a change in schedule is required.   4. No Aspirin/Ibuprofen for one week before surgery.  If you are on aspirin, any non-steroidal anti-inflammatory medications (Mobic, Aleve, Ibuprofen) you should stop taking it 7 days prior to your surgery.  You make take Tylenol  For pain prior to surgery.  5. Medications- If you are taking daily heart and blood pressure medications, seizure, reflux, allergy, asthma, anxiety, pain or diabetes medications, make sure the surgery center/hospital is aware before the day of surgery so they may notify you which medications to take or avoid the day of surgery. 6. No food or drink after midnight the night before surgery unless directed otherwise by surgical center/hospital staff. 7. No alcoholic beverages 24 hours prior to surgery.  No smoking 24 hours prior to or 24 hours after surgery. 8. Wear loose pants or shorts- loose enough to fit over bandages, boots, and casts. 9. No slip on shoes, sneakers are best. 10. Bring  your boot with you to the surgery center/hospital.  Also bring crutches or a walker if your physician has prescribed it for you.  If you do not have this equipment, it will be provided for you after surgery. 11. If you have not been contracted by the surgery center/hospital by the day before your surgery, call to confirm the date and time of your surgery. 12. Leave-time from work may vary depending on the type of surgery you have.  Appropriate arrangements should be made prior to surgery with your employer. 13. Prescriptions will be provided immediately following surgery by your doctor.  Have these filled as soon as possible after surgery and take the medication as directed. 14. Remove nail polish on the operative foot. 15. Wash the night before surgery.  The night before surgery wash the foot and leg well with the antibacterial soap provided and water paying special attention to beneath the toenails and in between the toes.  Rinse thoroughly with water and dry well with a towel.  Perform this wash unless told not to do so by your physician.  Enclosed: 1 Ice pack (please put in freezer the night before surgery)   1 Hibiclens skin cleaner   Pre-op Instructions  If you have any questions regarding the instructions, do not hesitate to call our office.  [place]: [address] [place], [place] [address] [phone]  [place]: [address] [place], [place] [address] [phone]  [place]: [address]  [place], [place] [address] [phone]   Dr.   Norman Regal DPM, Dr. Matthew Wagoner DPM, Dr. M. Todd Hyatt DPM, Dr. Titorya Stover DPM 

## 2016-07-13 DIAGNOSIS — I10 Essential (primary) hypertension: Secondary | ICD-10-CM | POA: Diagnosis not present

## 2016-07-13 DIAGNOSIS — E782 Mixed hyperlipidemia: Secondary | ICD-10-CM | POA: Diagnosis not present

## 2016-07-13 DIAGNOSIS — R7303 Prediabetes: Secondary | ICD-10-CM | POA: Diagnosis not present

## 2016-07-15 DIAGNOSIS — M069 Rheumatoid arthritis, unspecified: Secondary | ICD-10-CM | POA: Diagnosis not present

## 2016-07-15 DIAGNOSIS — H409 Unspecified glaucoma: Secondary | ICD-10-CM | POA: Diagnosis not present

## 2016-07-15 DIAGNOSIS — I259 Chronic ischemic heart disease, unspecified: Secondary | ICD-10-CM | POA: Diagnosis not present

## 2016-07-15 DIAGNOSIS — I10 Essential (primary) hypertension: Secondary | ICD-10-CM | POA: Diagnosis not present

## 2016-07-15 DIAGNOSIS — Z6823 Body mass index (BMI) 23.0-23.9, adult: Secondary | ICD-10-CM | POA: Diagnosis not present

## 2016-07-15 DIAGNOSIS — N529 Male erectile dysfunction, unspecified: Secondary | ICD-10-CM | POA: Diagnosis not present

## 2016-07-15 DIAGNOSIS — R7301 Impaired fasting glucose: Secondary | ICD-10-CM | POA: Diagnosis not present

## 2016-07-15 DIAGNOSIS — E782 Mixed hyperlipidemia: Secondary | ICD-10-CM | POA: Diagnosis not present

## 2016-07-19 ENCOUNTER — Encounter: Payer: Self-pay | Admitting: Podiatry

## 2016-07-19 DIAGNOSIS — M2012 Hallux valgus (acquired), left foot: Secondary | ICD-10-CM | POA: Diagnosis not present

## 2016-07-19 DIAGNOSIS — M2042 Other hammer toe(s) (acquired), left foot: Secondary | ICD-10-CM | POA: Diagnosis not present

## 2016-07-19 DIAGNOSIS — M2022 Hallux rigidus, left foot: Secondary | ICD-10-CM | POA: Diagnosis not present

## 2016-07-19 DIAGNOSIS — M205X2 Other deformities of toe(s) (acquired), left foot: Secondary | ICD-10-CM | POA: Diagnosis not present

## 2016-07-19 DIAGNOSIS — M21612 Bunion of left foot: Secondary | ICD-10-CM | POA: Diagnosis not present

## 2016-07-19 DIAGNOSIS — I10 Essential (primary) hypertension: Secondary | ICD-10-CM | POA: Diagnosis not present

## 2016-07-27 ENCOUNTER — Ambulatory Visit (INDEPENDENT_AMBULATORY_CARE_PROVIDER_SITE_OTHER): Payer: PPO | Admitting: Podiatry

## 2016-07-27 ENCOUNTER — Encounter: Payer: Self-pay | Admitting: Podiatry

## 2016-07-27 ENCOUNTER — Ambulatory Visit (INDEPENDENT_AMBULATORY_CARE_PROVIDER_SITE_OTHER): Payer: PPO

## 2016-07-27 VITALS — BP 139/69 | HR 66 | Resp 16

## 2016-07-27 DIAGNOSIS — M2042 Other hammer toe(s) (acquired), left foot: Secondary | ICD-10-CM

## 2016-07-27 DIAGNOSIS — M2012 Hallux valgus (acquired), left foot: Secondary | ICD-10-CM | POA: Diagnosis not present

## 2016-07-27 NOTE — Progress Notes (Signed)
Subjective:    Patient ID: Matthew Weiss, male   DOB: 76 y.o.   MRN: [id number]   HPI patient states doing well with minimal discomfort and states that he is walking without significant pain    ROS      Objective:  Physical Exam neurovascular status intact negative Homan sign was noted with patient's digits and good alignment pins in place first metatarsal and good alignment and left hallux in good position based on the preoperative condition and the fact this was revisional surgery     Assessment:   Doing well overall post surgical correction of the left foot      Plan:   H&P conditions reviewed and at this point I reapplied sterile dressings instructed on continued elevation in the importance of not bearing any weight on this foot and continue with complete immobilization. Reappoint 2 weeks for stitch removal and re-x-ray and will be seen back at that time  X-ray indicates there is strain on the Akin osteotomy site but the wire is in place and the box is in place and should hold good at this position and clinically looks good

## 2016-07-29 NOTE — Progress Notes (Signed)
DOS 6.19.2018   1. Aiken osteotomy with wire left.   2. Fusion with pin 2,3 toes left.   3. Modified McBride left.

## 2016-08-10 ENCOUNTER — Ambulatory Visit (INDEPENDENT_AMBULATORY_CARE_PROVIDER_SITE_OTHER): Payer: PPO

## 2016-08-10 ENCOUNTER — Encounter: Payer: Self-pay | Admitting: Podiatry

## 2016-08-10 ENCOUNTER — Ambulatory Visit (INDEPENDENT_AMBULATORY_CARE_PROVIDER_SITE_OTHER): Payer: PPO | Admitting: Podiatry

## 2016-08-10 DIAGNOSIS — M2012 Hallux valgus (acquired), left foot: Secondary | ICD-10-CM | POA: Diagnosis not present

## 2016-08-10 NOTE — Progress Notes (Signed)
Subjective:    Patient ID: Matthew Weiss, male   DOB: 76 y.o.   MRN: [id number]   HPI patient states she's doing great with minimal discomfort in the left foot    ROS      Objective:  Physical Exam neurovascular status intact negative Homan sign was noted with well-healing surgical sites left first metatarsal hallux and second digit and third digit     Assessment:    Doing well post osteotomy is left with digital fusion left with pin in place second and third toe     Plan:     H&P conditions reviewed pins removed from second and third toes sterile dressings applied x-rays reviewed and gradual shoe gear usage can start in about 2 weeks. Patient will continue immobilization full-time until then and even part-time after that and will be seen back 4 weeks or earlier if needed  X-rays indicate that the osteotomies if healed well but toes are in good alignment with slight stress on the interphalangeal joint site left but it does appear to be stable with the wire box hold

## 2016-09-08 ENCOUNTER — Ambulatory Visit (INDEPENDENT_AMBULATORY_CARE_PROVIDER_SITE_OTHER): Payer: PPO | Admitting: Podiatry

## 2016-09-08 ENCOUNTER — Encounter: Payer: Self-pay | Admitting: Podiatry

## 2016-09-08 ENCOUNTER — Ambulatory Visit (INDEPENDENT_AMBULATORY_CARE_PROVIDER_SITE_OTHER): Payer: PPO

## 2016-09-08 VITALS — BP 140/72 | HR 65 | Resp 16

## 2016-09-08 DIAGNOSIS — M2042 Other hammer toe(s) (acquired), left foot: Secondary | ICD-10-CM

## 2016-09-08 DIAGNOSIS — M2012 Hallux valgus (acquired), left foot: Secondary | ICD-10-CM

## 2016-09-08 DIAGNOSIS — M2010 Hallux valgus (acquired), unspecified foot: Secondary | ICD-10-CM | POA: Diagnosis not present

## 2016-09-08 NOTE — Progress Notes (Signed)
Subjective:    Patient ID: Matthew Weiss, male   DOB: 76 y.o.   MRN: [id number]   HPI patient states doing very well with left foot with minimal discomfort and swelling and pain    ROS      Objective:  Physical Exam neurovascular status intact negative Homans sign was noted with patient's left foot looking good with wound edges well coapted no lesions in between the toes noted     Assessment:   Doing well post forefoot reconstruction left      Plan:    X-rays evaluated and allow patient to gradually return to normal shoe gear and activities and will be seen back as needed  X-rays indicate the osteotomy is healing well the toes are in reasonably straight position with no indications of pathology

## 2016-09-23 ENCOUNTER — Encounter (INDEPENDENT_AMBULATORY_CARE_PROVIDER_SITE_OTHER): Payer: Self-pay | Admitting: *Deleted

## 2016-10-05 ENCOUNTER — Encounter (INDEPENDENT_AMBULATORY_CARE_PROVIDER_SITE_OTHER): Payer: Self-pay | Admitting: *Deleted

## 2016-10-05 ENCOUNTER — Other Ambulatory Visit (INDEPENDENT_AMBULATORY_CARE_PROVIDER_SITE_OTHER): Payer: Self-pay | Admitting: Internal Medicine

## 2016-10-05 ENCOUNTER — Telehealth (INDEPENDENT_AMBULATORY_CARE_PROVIDER_SITE_OTHER): Payer: Self-pay | Admitting: *Deleted

## 2016-10-05 DIAGNOSIS — Z86010 Personal history of colonic polyps: Secondary | ICD-10-CM

## 2016-10-05 MED ORDER — PEG 3350-KCL-NA BICARB-NACL 420 G PO SOLR: 4000.0000 mL | Freq: Once | ORAL | 0 refills | Status: AC

## 2016-10-05 NOTE — Telephone Encounter (Signed)
Patient needs trilyte 

## 2016-10-19 ENCOUNTER — Telehealth (INDEPENDENT_AMBULATORY_CARE_PROVIDER_SITE_OTHER): Payer: Self-pay | Admitting: *Deleted

## 2016-10-19 NOTE — Telephone Encounter (Signed)
Referring MD/PCP: hall   Procedure: tcs  Reason/Indication:  Hx polyps  Has patient had this procedure before?  Yes, 2013  If so, when, by whom and where?    Is there a family history of colon cancer?  no  Who?  What age when diagnosed?    Is patient diabetic?   no      Does patient have prosthetic heart valve or mechanical valve?  no  Do you have a pacemaker?  no  Has patient ever had endocarditis? no  Has patient had joint replacement within last 12 months?  no  Does patient tend to be constipated or take laxatives? no  Does patient have a history of alcohol/drug use?  no  Is patient on Coumadin, Plavix and/or Aspirin? yes  Medications: see epic  Allergies: nkda  Medication Adjustment per Dr Laural Golden: asa 2 days  Procedure date & time: 11/10/16 at 59

## 2016-10-19 NOTE — Telephone Encounter (Signed)
agree

## 2016-10-21 DIAGNOSIS — Z23 Encounter for immunization: Secondary | ICD-10-CM | POA: Diagnosis not present

## 2016-11-03 DIAGNOSIS — H40001 Preglaucoma, unspecified, right eye: Secondary | ICD-10-CM | POA: Diagnosis not present

## 2016-11-03 DIAGNOSIS — H25813 Combined forms of age-related cataract, bilateral: Secondary | ICD-10-CM | POA: Diagnosis not present

## 2016-11-03 DIAGNOSIS — H4089 Other specified glaucoma: Secondary | ICD-10-CM | POA: Diagnosis not present

## 2016-11-07 ENCOUNTER — Ambulatory Visit: Payer: Commercial Managed Care - HMO | Admitting: Cardiovascular Disease

## 2016-11-10 ENCOUNTER — Encounter (HOSPITAL_COMMUNITY)
Admission: RE | Disposition: A | Discharge status: Home / Self Care | Payer: Self-pay | Source: Ambulatory Visit | Attending: Internal Medicine

## 2016-11-10 ENCOUNTER — Encounter (HOSPITAL_COMMUNITY): Payer: Self-pay | Admitting: *Deleted

## 2016-11-10 ENCOUNTER — Ambulatory Visit (HOSPITAL_COMMUNITY)
Admission: RE | Admit: 2016-11-10 | Discharge: 2016-11-10 | Disposition: A | Discharge status: Home / Self Care | Payer: PPO | Source: Ambulatory Visit | Attending: Internal Medicine | Admitting: Internal Medicine

## 2016-11-10 DIAGNOSIS — D123 Benign neoplasm of transverse colon: Secondary | ICD-10-CM | POA: Diagnosis not present

## 2016-11-10 DIAGNOSIS — Z7982 Long term (current) use of aspirin: Secondary | ICD-10-CM | POA: Diagnosis not present

## 2016-11-10 DIAGNOSIS — I429 Cardiomyopathy, unspecified: Secondary | ICD-10-CM | POA: Diagnosis not present

## 2016-11-10 DIAGNOSIS — Z1211 Encounter for screening for malignant neoplasm of colon: Secondary | ICD-10-CM | POA: Diagnosis not present

## 2016-11-10 DIAGNOSIS — E785 Hyperlipidemia, unspecified: Secondary | ICD-10-CM | POA: Insufficient documentation

## 2016-11-10 DIAGNOSIS — Z09 Encounter for follow-up examination after completed treatment for conditions other than malignant neoplasm: Secondary | ICD-10-CM | POA: Diagnosis not present

## 2016-11-10 DIAGNOSIS — Z86010 Personal history of colonic polyps: Secondary | ICD-10-CM | POA: Insufficient documentation

## 2016-11-10 DIAGNOSIS — K648 Other hemorrhoids: Secondary | ICD-10-CM | POA: Insufficient documentation

## 2016-11-10 DIAGNOSIS — I10 Essential (primary) hypertension: Secondary | ICD-10-CM | POA: Diagnosis not present

## 2016-11-10 DIAGNOSIS — Z79899 Other long term (current) drug therapy: Secondary | ICD-10-CM | POA: Insufficient documentation

## 2016-11-10 DIAGNOSIS — F329 Major depressive disorder, single episode, unspecified: Secondary | ICD-10-CM | POA: Insufficient documentation

## 2016-11-10 DIAGNOSIS — Z87891 Personal history of nicotine dependence: Secondary | ICD-10-CM | POA: Diagnosis not present

## 2016-11-10 HISTORY — PX: POLYPECTOMY: SHX5525

## 2016-11-10 HISTORY — PX: COLONOSCOPY: SHX5424

## 2016-11-10 HISTORY — DX: Polyp of colon: K63.5

## 2016-11-10 SURGERY — COLONOSCOPY
Anesthesia: Moderate Sedation

## 2016-11-10 MED ORDER — MIDAZOLAM HCL 5 MG/5ML IJ SOLN
INTRAMUSCULAR | Status: DC | PRN
  Administered 2016-11-10: 1 mg via INTRAVENOUS
  Administered 2016-11-10: 2 mg via INTRAVENOUS

## 2016-11-10 MED ORDER — ATROPINE SULFATE 1 MG/ML IJ SOLN
INTRAMUSCULAR | Status: DC | PRN
  Administered 2016-11-10: .5 mg via INTRAVENOUS

## 2016-11-10 MED ORDER — MIDAZOLAM HCL 5 MG/5ML IJ SOLN
INTRAMUSCULAR | Status: AC
  Filled 2016-11-10: qty 10

## 2016-11-10 MED ORDER — SIMETHICONE 40 MG/0.6ML PO SUSP
ORAL | Status: DC | PRN
  Administered 2016-11-10: 09:00:00

## 2016-11-10 MED ORDER — ATROPINE SULFATE 1 MG/ML IJ SOLN
INTRAMUSCULAR | Status: AC
  Filled 2016-11-10: qty 1

## 2016-11-10 MED ORDER — MEPERIDINE HCL 50 MG/ML IJ SOLN
INTRAMUSCULAR | Status: AC
  Filled 2016-11-10: qty 1

## 2016-11-10 MED ORDER — MEPERIDINE HCL 50 MG/ML IJ SOLN
INTRAMUSCULAR | Status: DC | PRN
  Administered 2016-11-10 (×2): 25 mg via INTRAVENOUS

## 2016-11-10 MED ORDER — SODIUM CHLORIDE 0.9 % IV SOLN
INTRAVENOUS | Status: DC
  Administered 2016-11-10: 09:00:00 via INTRAVENOUS

## 2016-11-10 NOTE — Discharge Instructions (Signed)
Colon Polyps Polyps are tissue growths inside the body. Polyps can grow in many places, including the large intestine (colon). A polyp may be a round bump or a mushroom-shaped growth. You could have one polyp or several. Most colon polyps are noncancerous (benign). However, some colon polyps can become cancerous over time. What are the causes? The exact cause of colon polyps is not known. What increases the risk? This condition is more likely to develop in people who:  Have a family history of colon cancer or colon polyps.  Are older than 33 or older than 45 if they are African American.  Have inflammatory bowel disease, such as ulcerative colitis or Crohn disease.  Are overweight.  Smoke cigarettes.  Do not get enough exercise.  Drink too much alcohol.  Eat a diet that is: ? High in fat and red meat. ? Low in fiber.  Had childhood cancer that was treated with abdominal radiation.  What are the signs or symptoms? Most polyps do not cause symptoms. If you have symptoms, they may include:  Blood coming from your rectum when having a bowel movement.  Blood in your stool.The stool may look dark red or black.  A change in bowel habits, such as constipation or diarrhea.  How is this diagnosed? This condition is diagnosed with a colonoscopy. This is a procedure that uses a lighted, flexible scope to look at the inside of your colon. How is this treated? Treatment for this condition involves removing any polyps that are found. Those polyps will then be tested for cancer. If cancer is found, your health care provider will talk to you about options for colon cancer treatment. Follow these instructions at home: Diet  Eat plenty of fiber, such as fruits, vegetables, and whole grains.  Eat foods that are high in calcium and vitamin D, such as milk, cheese, yogurt, eggs, liver, fish, and broccoli.  Limit foods high in fat, red meats, and processed meats, such as hot dogs,  sausage, bacon, and lunch meats.  Maintain a healthy weight, or lose weight if recommended by your health care provider. General instructions  Do not smoke cigarettes.  Do not drink alcohol excessively.  Keep all follow-up visits as told by your health care provider. This is important. This includes keeping regularly scheduled colonoscopies. Talk to your health care provider about when you need a colonoscopy.  Exercise every day or as told by your health care provider. Contact a health care provider if:  You have new or worsening bleeding during a bowel movement.  You have new or increased blood in your stool.  You have a change in bowel habits.  You unexpectedly lose weight. This information is not intended to replace advice given to you by your health care provider. Make sure you discuss any questions you have with your health care provider. Document Released: 10/14/2003 Document Revised: 06/25/2015 Document Reviewed: 12/08/2014 Elsevier Interactive Patient Education  2018 [employer].  Colonoscopy, Adult, Care After This sheet gives you information about how to care for yourself after your procedure. Your health care provider may also give you more specific instructions. If you have problems or questions, contact your health care provider. What can I expect after the procedure? After the procedure, it is common to have:  A small amount of blood in your stool for 24 hours after the procedure.  Some gas.  Mild abdominal cramping or bloating.  Follow these instructions at home: General instructions   For the first 24  hours after the procedure: ? Do not drive or use machinery. ? Do not sign important documents. ? Do not drink alcohol. ? Do your regular daily activities at a slower pace than normal. ? Eat soft, easy-to-digest foods. ? Rest often.  Take over-the-counter or prescription medicines only as told by your health care provider.  It is up to you to get the  results of your procedure. Ask your health care provider, or the department performing the procedure, when your results will be ready. Relieving cramping and bloating  Try walking around when you have cramps or feel bloated.  Apply heat to your abdomen as told by your health care provider. Use a heat source that your health care provider recommends, such as a moist heat pack or a heating pad. ? Place a towel between your skin and the heat source. ? Leave the heat on for 20-30 minutes. ? Remove the heat if your skin turns bright red. This is especially important if you are unable to feel pain, heat, or cold. You may have a greater risk of getting burned. Eating and drinking  Drink enough fluid to keep your urine clear or pale yellow.  Resume your normal diet as instructed by your health care provider. Avoid heavy or fried foods that are hard to digest.  Avoid drinking alcohol for as long as instructed by your health care provider. Contact a health care provider if:  You have blood in your stool 2-3 days after the procedure. Get help right away if:  You have more than a small spotting of blood in your stool.  You pass large blood clots in your stool.  Your abdomen is swollen.  You have nausea or vomiting.  You have a fever.  You have increasing abdominal pain that is not relieved with medicine. This information is not intended to replace advice given to you by your health care provider. Make sure you discuss any questions you have with your health care provider. Document Released: 09/01/2003 Document Revised: 10/12/2015 Document Reviewed: 03/31/2015 Elsevier Interactive Patient Education  2018 [employer]. Resume aspirin on 11/11/2016. Resume other medications and diet as before. No driving for 24 hours. Physician will call with biopsy results.

## 2016-11-10 NOTE — Op Note (Signed)
[facility] Patient Name: Matthew Weiss Procedure Date: 11/10/2016 8:54 AM MRN: [id number] Date of Birth: [birth year] Attending MD: Hildred Laser , MD CSN: [id number] Age: 76 Admit Type: Outpatient Procedure:                Colonoscopy Indications:              High risk colon cancer surveillance: Personal                            history of colonic polyps Providers:                Hildred Laser, MD, Otis Peak B. Sharon Seller, RN, Zoila Shutter, Technologist Referring MD:             Delphina Cahill, MD Medicines:                Meperidine 50 mg IV, Midazolam 3 mg IV, Atropine                            0.5 mg IV Complications:            No immediate complications. Estimated Blood Loss:     Estimated blood loss was minimal. Procedure:                Pre-Anesthesia Assessment:                           - Prior to the procedure, a History and Physical                            was performed, and patient medications and                            allergies were reviewed. The patient's tolerance of                            previous anesthesia was also reviewed. The risks                            and benefits of the procedure and the sedation                            options and risks were discussed with the patient.                            All questions were answered, and informed consent                            was obtained. Prior Anticoagulants: The patient                            last took aspirin 6 days prior to the procedure.  ASA Grade Assessment: III - A patient with severe                            systemic disease. After reviewing the risks and                            benefits, the patient was deemed in satisfactory                            condition to undergo the procedure.                           After obtaining informed consent, the colonoscope                            was passed under direct vision.  Throughout the                            procedure, the patient's blood pressure, pulse, and                            oxygen saturations were monitored continuously. The                            EC-[id number] ([id number]) scope was introduced through                            the anus and advanced to the the cecum, identified                            by appendiceal orifice and ileocecal valve. The                            colonoscopy was performed without difficulty. The                            patient tolerated the procedure well. The quality                            of the bowel preparation was excellent. The                            ileocecal valve, appendiceal orifice, and rectum                            were photographed. Scope In: 9:21:57 AM Scope Out: 9:39:55 AM Scope Withdrawal Time: 0 hours 8 minutes 7 seconds  Total Procedure Duration: 0 hours 17 minutes 58 seconds  Findings:      The perianal and digital rectal examinations were normal.      A 4 mm polyp was found in the distal transverse colon. The polyp was       sessile. The polyp was removed with a cold snare. Resection and       retrieval were complete.  The exam was otherwise normal throughout the examined colon.      Internal hemorrhoids were found. The hemorrhoids were medium-sized. Impression:               - One 4 mm polyp in the distal transverse colon,                            removed with a cold snare. Resected and retrieved.                           - Internal hemorrhoids. Moderate Sedation:      Moderate (conscious) sedation was administered by the endoscopy nurse       and supervised by the endoscopist. The following parameters were       monitored: oxygen saturation, heart rate, blood pressure, CO2       capnography and response to care. Total physician intraservice time was       25 minutes. Recommendation:           - Patient has a contact number available for                             emergencies. The signs and symptoms of potential                            delayed complications were discussed with the                            patient. Return to normal activities tomorrow.                            Written discharge instructions were provided to the                            patient.                           - Resume previous diet.                           - Continue present medications.                           - Resume aspirin at prior dose tomorrow.                           - Await pathology results.                           - No recommendation at this time regarding repeat                            colonoscopy. Procedure Code(s):        --- Professional ---                           [phone], Colonoscopy, flexible; with removal of  tumor(s), polyp(s), or other lesion(s) by snare                            technique                           99152, Moderate sedation services provided by the                            same physician or other qualified health care                            professional performing the diagnostic or                            therapeutic service that the sedation supports,                            requiring the presence of an independent trained                            observer to assist in the monitoring of the                            patient's level of consciousness and physiological                            status; initial 15 minutes of intraservice time,                            patient age 80 years or older                           [id number], Moderate sedation services; each additional                            15 minutes intraservice time Diagnosis Code(s):        --- Professional ---                           Z86.010, Personal history of colonic polyps                           D12.3, Benign neoplasm of transverse colon (hepatic                            flexure or splenic  flexure)                           K64.8, Other hemorrhoids CPT copyright 2016 American Medical Association. All rights reserved. The codes documented in this report are preliminary and upon coder review may  be revised to meet current compliance requirements. Hildred Laser, MD Hildred Laser, MD 11/10/2016 9:49:32 AM This report has been signed electronically. Number of Addenda: 0

## 2016-11-10 NOTE — H&P (Addendum)
Matthew Weiss is an 76 y.o. male.   Chief Complaint: patient is here for colonoscopy. HPI: patient 76 year old African-American male with history of colonic adenomas and is here for surveillance colonoscopy. His last exam was in September 2013 with removal of small tubular adenoma. He had large adenoma removed from his ascending colon and 2007. He denies abdominal pain change in bowel habits or rectal bleeding. He became diaphoretic this morning after he took Fleet enema. He felt dizzy lightheaded but did not pass out.he did not experience shortness of breath or chest pain. Family history is negative for CRC.    Past Medical History:  Diagnosis Date  . Arthritis    RA  . Colon polyps   . Depression   . Dyslipidemia   . Hypertension   . LBBB (left bundle branch block)    Which comes and goes.  Marland Kitchen NICM (nonischemic cardiomyopathy) ([place])    Echo 09/22/09, EF 45-50%  . Shortness of breath    exertion    Past Surgical History:  Procedure Laterality Date  . BACK SURGERY    . CARDIAC CATHETERIZATION  09/22/09   Mild non-obstructive CAD  . CARDIOVASCULAR STRESS TEST  06/23/2008   Exercise:  normal perfusion. Low risk.  . COLONOSCOPY  10/12/2011   Procedure: COLONOSCOPY;  Surgeon: Rogene Houston, MD;  Location: [facility];  Service: Endoscopy;  Laterality: N/A;  930  . TRANSTHORACIC ECHOCARDIOGRAM  09/22/09   EF 45-50%.  No significant valvular disease.     Family History  Problem Relation Age of Onset  . Colon cancer Neg Hx    Social History:  reports that he has quit smoking. He quit after 15.00 years of use. He has never used smokeless tobacco. He reports that he does not drink alcohol or use drugs.  Allergies: No Known Allergies  Medications Prior to Admission  Medication Sig Dispense Refill  . amLODipine (NORVASC) 5 MG tablet Take 10 mg by mouth daily.     Marland Kitchen aspirin EC 81 MG tablet Take 81 mg by mouth daily.    . carvedilol (COREG) 6.25 MG tablet Take 1 tablet (6.25 mg  total) by mouth 2 (two) times daily. 60 tablet 11  . dorzolamide-timolol (COSOPT) 22.3-6.8 MG/ML ophthalmic solution Place 1 drop into both eyes 2 (two) times daily.    . folic acid (FOLVITE) 1 MG tablet Take 1 mg by mouth daily.    Marland Kitchen latanoprost (XALATAN) 0.005 % ophthalmic solution Place 1 drop into both eyes at bedtime.     Marland Kitchen lisinopril (PRINIVIL,ZESTRIL) 40 MG tablet TAKE 1 TABLET EVERY DAY (Patient taking differently: TAKE 40 MG TABLET BY MOUTH EVERY DAY) 90 tablet 3  . lisinopril (PRINIVIL,ZESTRIL) 40 MG tablet Take 40 mg by mouth daily.    . methotrexate (RHEUMATREX) 2.5 MG tablet Take 20 mg by mouth once a week.     . multivitamin (ONE-A-DAY MEN'S) TABS tablet Take 1 tablet by mouth daily.    . Omega-3 350 MG CPDR Take 350 mg by mouth daily.      No results found for this or any previous visit (from the past 48 hour(s)). No results found.  ROS  Blood pressure 116/65, pulse (!) 56, temperature 97.7 F (36.5 C), temperature source Oral, resp. rate 10, height 5\' 9"  (1.753 m), weight 160 lb (72.6 kg), SpO2 100 %. Physical Exam  Constitutional: He appears well-developed and well-nourished.  HENT:  Mouth/Throat: Oropharynx is clear and moist.  Eyes: Conjunctivae are normal. No scleral icterus.  Neck: No thyromegaly present.  Cardiovascular: Normal rate and regular rhythm.   Murmur (DDUKG2/5 systolic murmur best heard at left sternal border.) heard. Respiratory: Effort normal and breath sounds normal.  GI: Soft. He exhibits no distension and no mass. There is no tenderness.  Musculoskeletal: He exhibits no edema.  Lymphadenopathy:    He has no cervical adenopathy.  Neurological: He is alert.  Skin: Skin is warm and dry.     Assessment/Plan History of colonic adenomas. Surveillance colonoscopy.  Hildred Laser, MD 11/10/2016, 9:10 AM

## 2016-11-15 ENCOUNTER — Encounter (HOSPITAL_COMMUNITY): Payer: Self-pay | Admitting: Internal Medicine

## 2017-01-12 ENCOUNTER — Encounter: Payer: Self-pay | Admitting: Cardiovascular Disease

## 2017-01-12 ENCOUNTER — Ambulatory Visit: Payer: PPO | Admitting: Cardiovascular Disease

## 2017-01-12 VITALS — BP 110/58 | HR 61 | Ht 69.0 in | Wt 160.0 lb

## 2017-01-12 DIAGNOSIS — I447 Left bundle-branch block, unspecified: Secondary | ICD-10-CM

## 2017-01-12 DIAGNOSIS — I10 Essential (primary) hypertension: Secondary | ICD-10-CM | POA: Diagnosis not present

## 2017-01-12 DIAGNOSIS — E7800 Pure hypercholesterolemia, unspecified: Secondary | ICD-10-CM | POA: Diagnosis not present

## 2017-01-12 DIAGNOSIS — I428 Other cardiomyopathies: Secondary | ICD-10-CM | POA: Diagnosis not present

## 2017-01-12 NOTE — Patient Instructions (Addendum)
Dr Croitoru recommends that you schedule a follow-up appointment in 12 months. You will receive a reminder letter in the mail two months in advance. If you don't receive a letter, please call our office to schedule the follow-up appointment.  If you need a refill on your cardiac medications before your next appointment, please call your pharmacy. 

## 2017-01-12 NOTE — Progress Notes (Signed)
Cardiology Office Note    Date:  01/12/2017   ID:  Matthew Weiss, DOB [birth year], MRN [id number]  PCP:  Celene Squibb, MD  Cardiologist:   Sanda Klein, MD   Chief Complaint  Patient presents with  . Follow-up    pt reports very infrequent sharp chest pains. "hurts just enough to notice it" otherwise no complaints. pt states that he had foot surgery in june, the anesthesiologist told him he had a hole in his heart    History of Present Illness:  Matthew Weiss is a 76 y.o. male with mild nonischemic cardiomyopathy, rate related left bundle branch block, hypertension returning for follow-up.   He continues to work for the [employer] system and goes to the [facility] in [place] 4 days a week as well as walking 4 miles a day.  The patient specifically denies any chest pain at rest or with exertion, dyspnea at rest or with exertion, orthopnea, paroxysmal nocturnal dyspnea, syncope, palpitations, focal neurological deficits, intermittent claudication, lower extremity edema, unexplained weight gain, cough, hemoptysis or wheezing.  The patient also denies abdominal pain, nausea, vomiting, dysphagia, diarrhea, constipation, polyuria, polydipsia, dysuria, hematuria, frequency, urgency, abnormal bleeding or bruising, fever, chills, unexpected weight changes, mood swings, change in skin or hair texture, change in voice quality, auditory or visual problems, allergic reactions or rashes, new musculoskeletal complaints other than usual "aches and pains".  He has occasional fleeting left-sided chest discomfort at the end of the day when he is relaxing in his chair.  This never occurs during exercise.  He has left bundle branch block on most of his tracings, although when his heart rate was particularly slow a couple of years ago,  he conducted with a narrow QRS.  He has mildly depressed left ventricular systolic function with an ejection fraction of 45-50% and is taking both ace inhibitors  and beta blockers. He continues to take methotrexate for rheumatoid arthritis.  His rheumatologist has retired to Dr. Nevada Crane monitor is the treatment of his liver function tests.  He is taking a statin for hyperlipidemia.  He had a low risk nuclear stress test in 2010.   Past Medical History:  Diagnosis Date  . Arthritis    RA  . Colon polyps   . Depression   . Dyslipidemia   . Hypertension   . LBBB (left bundle branch block)    Which comes and goes.  Matthew Weiss NICM (nonischemic cardiomyopathy) ([place])    Echo 09/22/09, EF 45-50%  . Shortness of breath    exertion    Past Surgical History:  Procedure Laterality Date  . BACK SURGERY    . CARDIAC CATHETERIZATION  09/22/09   Mild non-obstructive CAD  . CARDIOVASCULAR STRESS TEST  06/23/2008   Exercise:  normal perfusion. Low risk.  . COLONOSCOPY  10/12/2011   Procedure: COLONOSCOPY;  Surgeon: Rogene Houston, MD;  [facility];  Service: Endoscopy;  Laterality: N/A;  930  . COLONOSCOPY N/A 11/10/2016   Procedure: COLONOSCOPY;  Surgeon: Rogene Houston, MD;  [facility];  Service: Endoscopy;  Laterality: N/A;  930  . POLYPECTOMY  11/10/2016   Procedure: POLYPECTOMY;  Surgeon: Rogene Houston, MD;  [facility];  Service: Endoscopy;;  colon  . TRANSTHORACIC ECHOCARDIOGRAM  09/22/09   EF 45-50%.  No significant valvular disease.     Current Medications: Outpatient Medications Prior to Visit  Medication Sig Dispense Refill  . amLODipine (NORVASC) 10 MG tablet Take 1 tablet  by mouth daily.    Matthew Weiss aspirin EC 81 MG tablet Take 1 tablet (81 mg total) by mouth daily.    . carvedilol (COREG) 6.25 MG tablet Take 1 tablet (6.25 mg total) by mouth 2 (two) times daily. 60 tablet 11  . dorzolamide-timolol (COSOPT) 22.3-6.8 MG/ML ophthalmic solution Place 1 drop into both eyes 2 (two) times daily.    . folic acid (FOLVITE) 1 MG tablet Take 1 mg by mouth daily.    Matthew Weiss latanoprost (XALATAN) 0.005 % ophthalmic solution Place 1  drop into both eyes at bedtime.     Matthew Weiss lisinopril (PRINIVIL,ZESTRIL) 40 MG tablet Take 40 mg by mouth daily.    . methotrexate (RHEUMATREX) 2.5 MG tablet Take 20 mg by mouth once a week.     . multivitamin (ONE-A-DAY MEN'S) TABS tablet Take 1 tablet by mouth daily.    . Omega-3 350 MG CPDR Take 350 mg by mouth daily.    Matthew Weiss amLODipine (NORVASC) 5 MG tablet Take 10 mg by mouth daily.      No facility-administered medications prior to visit.      Allergies:   Patient has no known allergies.   Social History   Socioeconomic History  . Marital status: Married    Spouse name: None  . Number of children: None  . Years of education: None  . Highest education level: None  Social Needs  . Financial resource strain: None  . Food insecurity - worry: None  . Food insecurity - inability: None  . Transportation needs - medical: None  . Transportation needs - non-medical: None  Occupational History  . None  Tobacco Use  . Smoking status: Former Smoker    Years: 15.00  . Smokeless tobacco: Never Used  Substance and Sexual Activity  . Alcohol use: No  . Drug use: No  . Sexual activity: None  Other Topics Concern  . None  Social History Narrative  . None      ROS:   Please see the history of present illness.    ROS All other systems reviewed and are negative.   PHYSICAL EXAM:   VS:  BP (!) 110/58   Pulse 61   Ht 5\' 9"  (1.753 m)   Wt 160 lb (72.6 kg)   BMI 23.63 kg/m     General: Alert, oriented x3, no distress, lean and fit Head: no evidence of trauma, PERRL, EOMI, no exophtalmos or lid lag, no myxedema, no xanthelasma; normal ears, nose and oropharynx Neck: normal jugular venous pulsations and no hepatojugular reflux; brisk carotid pulses without delay and no carotid bruits Chest: clear to auscultation, no signs of consolidation by percussion or palpation, normal fremitus, symmetrical and full respiratory excursions Cardiovascular: normal position and quality of the apical  impulse, regular rhythm, normal first and paradoxically split second heart sounds, no murmurs, rubs or gallops Abdomen: no tenderness or distention, no masses by palpation, no abnormal pulsatility or arterial bruits, normal bowel sounds, no hepatosplenomegaly Extremities: no clubbing, cyanosis or edema; 2+ radial, ulnar and brachial pulses bilaterally; 2+ right femoral, posterior tibial and dorsalis pedis pulses; 2+ left femoral, posterior tibial and dorsalis pedis pulses; no subclavian or femoral bruits Neurological: grossly nonfocal Psych: Normal mood and affect   Wt Readings from Last 3 Encounters:  01/12/17 160 lb (72.6 kg)  11/10/16 160 lb (72.6 kg)  03/17/16 162 lb (73.5 kg)      Studies/Labs Reviewed:   EKG:  EKG is ordered today.  The ekg ordered today  shows normal sinus rhythm, left bundle branch block, no change from previous tracing  Recent Labs: No results found for requested labs within last 8760 hours.   Lipid Panel    Component Value Date/Time   CHOL  09/23/2009 0406    109        ATP III CLASSIFICATION:  <200     mg/dL   Desirable  200-239  mg/dL   Borderline High  >=240    mg/dL   High          TRIG 28 09/23/2009 0406   HDL 45 09/23/2009 0406   CHOLHDL 2.4 09/23/2009 0406   VLDL 6 09/23/2009 0406   LDLCALC  09/23/2009 0406    58        Total Cholesterol/HDL:CHD Risk Coronary Heart Disease Risk Table                     Men   Women  1/2 Average Risk   3.4   3.3  Average Risk       5.0   4.4  2 X Average Risk   9.6   7.1  3 X Average Risk  23.4   11.0        Use the calculated Patient Ratio above and the CHD Risk Table to determine the patient's CHD Risk.        ATP III CLASSIFICATION (LDL):  <100     mg/dL   Optimal  100-129  mg/dL   Near or Above                    Optimal  130-159  mg/dL   Borderline  160-189  mg/dL   High  >190     mg/dL   Very High      ASSESSMENT:    1. Nonischemic cardiomyopathy ([place])   2. Essential hypertension     3. LBBB (left bundle branch block)   4. Hypercholesterolemia      PLAN:  In order of problems listed above:  1. CMP: He has mild left ventricular systolic dysfunction but no signs or symptoms of congestive heart failure.  Clinically euvolemic without diuretics.  On appropriate ACE inhibitor and beta-blocker therapy, well tolerated.. 2. HTN: Excellent control. 3. rate related LBBB: Asymptomatic, no history of syncope or symptoms of bradycardia.  Discussed potential need for pacemaker in the future. 4. HLP: On statin therapy, labs checked by Dr. Nevada Crane.  Monitor for hepatotoxicity while also taking methotrexate.   Medication Adjustments/Labs and Tests Ordered: Current medicines are reviewed at length with the patient today.  Concerns regarding medicines are outlined above.  Medication changes, Labs and Tests ordered today are listed in the Patient Instructions below. Patient Instructions  Dr Sallyanne Kuster recommends that you schedule a follow-up appointment in 12 months. You will receive a reminder letter in the mail two months in advance. If you don't receive a letter, please call our office to schedule the follow-up appointment.  If you need a refill on your cardiac medications before your next appointment, please call your pharmacy.    Signed, Sanda Klein, MD  01/12/2017 11:09 AM    [facility] [address], [place], [place]  [address] Phone: [phone]; Fax: [phone]

## 2017-01-13 DIAGNOSIS — R7303 Prediabetes: Secondary | ICD-10-CM | POA: Diagnosis not present

## 2017-01-13 DIAGNOSIS — R7301 Impaired fasting glucose: Secondary | ICD-10-CM | POA: Diagnosis not present

## 2017-01-13 DIAGNOSIS — E782 Mixed hyperlipidemia: Secondary | ICD-10-CM | POA: Diagnosis not present

## 2017-01-13 DIAGNOSIS — I10 Essential (primary) hypertension: Secondary | ICD-10-CM | POA: Diagnosis not present

## 2017-01-16 DIAGNOSIS — I10 Essential (primary) hypertension: Secondary | ICD-10-CM | POA: Diagnosis not present

## 2017-01-16 DIAGNOSIS — E782 Mixed hyperlipidemia: Secondary | ICD-10-CM | POA: Diagnosis not present

## 2017-01-16 DIAGNOSIS — R7301 Impaired fasting glucose: Secondary | ICD-10-CM | POA: Diagnosis not present

## 2017-01-16 DIAGNOSIS — E875 Hyperkalemia: Secondary | ICD-10-CM | POA: Diagnosis not present

## 2017-02-16 DIAGNOSIS — H5203 Hypermetropia, bilateral: Secondary | ICD-10-CM | POA: Diagnosis not present

## 2017-02-16 DIAGNOSIS — H40033 Anatomical narrow angle, bilateral: Secondary | ICD-10-CM | POA: Diagnosis not present

## 2017-02-16 DIAGNOSIS — H2513 Age-related nuclear cataract, bilateral: Secondary | ICD-10-CM | POA: Diagnosis not present

## 2017-04-27 DIAGNOSIS — M069 Rheumatoid arthritis, unspecified: Secondary | ICD-10-CM | POA: Diagnosis not present

## 2017-04-27 DIAGNOSIS — Z6824 Body mass index (BMI) 24.0-24.9, adult: Secondary | ICD-10-CM | POA: Diagnosis not present

## 2017-04-27 DIAGNOSIS — M25512 Pain in left shoulder: Secondary | ICD-10-CM | POA: Diagnosis not present

## 2017-04-27 DIAGNOSIS — M25511 Pain in right shoulder: Secondary | ICD-10-CM | POA: Diagnosis not present

## 2017-05-09 DIAGNOSIS — W458XXA Other foreign body or object entering through skin, initial encounter: Secondary | ICD-10-CM | POA: Diagnosis not present

## 2017-05-09 DIAGNOSIS — I429 Cardiomyopathy, unspecified: Secondary | ICD-10-CM | POA: Diagnosis not present

## 2017-05-09 DIAGNOSIS — R42 Dizziness and giddiness: Secondary | ICD-10-CM | POA: Diagnosis not present

## 2017-05-09 DIAGNOSIS — I10 Essential (primary) hypertension: Secondary | ICD-10-CM | POA: Diagnosis not present

## 2017-05-09 DIAGNOSIS — I2510 Atherosclerotic heart disease of native coronary artery without angina pectoris: Secondary | ICD-10-CM | POA: Diagnosis not present

## 2017-05-09 DIAGNOSIS — I259 Chronic ischemic heart disease, unspecified: Secondary | ICD-10-CM | POA: Diagnosis not present

## 2017-05-09 DIAGNOSIS — M791 Myalgia, unspecified site: Secondary | ICD-10-CM | POA: Diagnosis not present

## 2017-05-09 DIAGNOSIS — E782 Mixed hyperlipidemia: Secondary | ICD-10-CM | POA: Diagnosis not present

## 2017-05-09 DIAGNOSIS — H409 Unspecified glaucoma: Secondary | ICD-10-CM | POA: Diagnosis not present

## 2017-05-09 DIAGNOSIS — M069 Rheumatoid arthritis, unspecified: Secondary | ICD-10-CM | POA: Diagnosis not present

## 2017-05-09 DIAGNOSIS — R7303 Prediabetes: Secondary | ICD-10-CM | POA: Diagnosis not present

## 2017-05-09 DIAGNOSIS — R7301 Impaired fasting glucose: Secondary | ICD-10-CM | POA: Diagnosis not present

## 2017-05-12 DIAGNOSIS — R7303 Prediabetes: Secondary | ICD-10-CM | POA: Diagnosis not present

## 2017-05-12 DIAGNOSIS — I429 Cardiomyopathy, unspecified: Secondary | ICD-10-CM | POA: Diagnosis not present

## 2017-05-12 DIAGNOSIS — I10 Essential (primary) hypertension: Secondary | ICD-10-CM | POA: Diagnosis not present

## 2017-05-12 DIAGNOSIS — R7301 Impaired fasting glucose: Secondary | ICD-10-CM | POA: Diagnosis not present

## 2017-05-12 DIAGNOSIS — H409 Unspecified glaucoma: Secondary | ICD-10-CM | POA: Diagnosis not present

## 2017-05-12 DIAGNOSIS — M069 Rheumatoid arthritis, unspecified: Secondary | ICD-10-CM | POA: Diagnosis not present

## 2017-05-12 DIAGNOSIS — E875 Hyperkalemia: Secondary | ICD-10-CM | POA: Diagnosis not present

## 2017-05-12 DIAGNOSIS — Z6824 Body mass index (BMI) 24.0-24.9, adult: Secondary | ICD-10-CM | POA: Diagnosis not present

## 2017-05-12 DIAGNOSIS — M25512 Pain in left shoulder: Secondary | ICD-10-CM | POA: Diagnosis not present

## 2017-06-15 ENCOUNTER — Ambulatory Visit: Payer: PPO | Admitting: Orthopaedic Surgery

## 2017-06-15 ENCOUNTER — Ambulatory Visit (INDEPENDENT_AMBULATORY_CARE_PROVIDER_SITE_OTHER): Payer: PPO

## 2017-06-15 ENCOUNTER — Encounter: Payer: Self-pay | Admitting: Orthopaedic Surgery

## 2017-06-15 VITALS — BP 134/73 | HR 64 | Ht 69.0 in | Wt 157.0 lb

## 2017-06-15 DIAGNOSIS — M25512 Pain in left shoulder: Secondary | ICD-10-CM

## 2017-06-15 NOTE — Progress Notes (Signed)
Dg  

## 2017-06-15 NOTE — Progress Notes (Signed)
Subjective: My left shoulder hurts    Patient ID: Matthew Weiss., male    DOB: 01-Jul-1940, 77 y.o.   MRN: [id number]  HPI He has a long history of left shoulder pain.  He has had injections in the past that helped.  He goes several years and the pain returns.  Over the last several months he has had increasing pain in the left shoulder.  He has no trauma, no weakness, no numbness.  He has popping at times.  He has pain lifting his left hand overhead or with some rotations of the left shoulder.  He has seen Dr. Wende Neighbors and I have copies of the notes.  I have reviewed the notes.  He has tried Advil, rubs, heat, ice with no significant help.   Review of Systems  Respiratory: Positive for shortness of breath. Negative for cough.   Cardiovascular: Negative for chest pain and leg swelling.  Musculoskeletal: Positive for arthralgias.  All other systems reviewed and are negative.  Past Medical History:  Diagnosis Date  . Arthritis    RA  . Colon polyps   . Depression   . Dyslipidemia   . Hypertension   . LBBB (left bundle branch block)    Which comes and goes.  Marland Kitchen NICM (nonischemic cardiomyopathy) ([place])    Echo 09/22/09, EF 45-50%  . Shortness of breath    exertion    Past Surgical History:  Procedure Laterality Date  . BACK SURGERY    . CARDIAC CATHETERIZATION  09/22/09   Mild non-obstructive CAD  . CARDIOVASCULAR STRESS TEST  06/23/2008   Exercise:  normal perfusion. Low risk.  . COLONOSCOPY  10/12/2011   Procedure: COLONOSCOPY;  Surgeon: Rogene Houston, MD;  [facility];  Service: Endoscopy;  Laterality: N/A;  930  . COLONOSCOPY N/A 11/10/2016   Procedure: COLONOSCOPY;  Surgeon: Rogene Houston, MD;  [facility];  Service: Endoscopy;  Laterality: N/A;  930  . POLYPECTOMY  11/10/2016   Procedure: POLYPECTOMY;  Surgeon: Rogene Houston, MD;  [facility];  Service: Endoscopy;;  colon  . TRANSTHORACIC ECHOCARDIOGRAM  09/22/09   EF 45-50%.   No significant valvular disease.     Current Outpatient Medications on File Prior to Visit  Medication Sig Dispense Refill  . amLODipine (NORVASC) 10 MG tablet Take 1 tablet by mouth daily.    Marland Kitchen aspirin EC 81 MG tablet Take 1 tablet (81 mg total) by mouth daily.    . carvedilol (COREG) 6.25 MG tablet Take 1 tablet (6.25 mg total) by mouth 2 (two) times daily. 60 tablet 11  . dorzolamide-timolol (COSOPT) 22.3-6.8 MG/ML ophthalmic solution Place 1 drop into both eyes 2 (two) times daily.    . folic acid (FOLVITE) 1 MG tablet Take 1 mg by mouth daily.    Marland Kitchen latanoprost (XALATAN) 0.005 % ophthalmic solution Place 1 drop into both eyes at bedtime.     Marland Kitchen lisinopril (PRINIVIL,ZESTRIL) 40 MG tablet Take 40 mg by mouth daily.    . methotrexate (RHEUMATREX) 2.5 MG tablet Take 20 mg by mouth once a week.     . multivitamin (ONE-A-DAY MEN'S) TABS tablet Take 1 tablet by mouth daily.    . Omega-3 350 MG CPDR Take 350 mg by mouth daily.     No current facility-administered medications on file prior to visit.     Social History   Socioeconomic History  . Marital status: Married    Spouse name: Not  on file  . Number of children: Not on file  . Years of education: Not on file  . Highest education level: Not on file  Occupational History  . Not on file  Social Needs  . Financial resource strain: Not on file  . Food insecurity:    Worry: Not on file    Inability: Not on file  . Transportation needs:    Medical: Not on file    Non-medical: Not on file  Tobacco Use  . Smoking status: Former Smoker    Years: 15.00  . Smokeless tobacco: Never Used  Substance and Sexual Activity  . Alcohol use: No  . Drug use: No  . Sexual activity: Not on file  Lifestyle  . Physical activity:    Days per week: Not on file    Minutes per session: Not on file  . Stress: Not on file  Relationships  . Social connections:    Talks on phone: Not on file    Gets together: Not on file    Attends religious  service: Not on file    Active member of club or organization: Not on file    Attends meetings of clubs or organizations: Not on file    Relationship status: Not on file  . Intimate partner violence:    Fear of current or ex partner: Not on file    Emotionally abused: Not on file    Physically abused: Not on file    Forced sexual activity: Not on file  Other Topics Concern  . Not on file  Social History Narrative  . Not on file    Family History  Problem Relation Age of Onset  . Colon cancer Neg Hx     BP 134/73   Pulse 64   Ht 5\' 9"  (1.753 m)   Wt 157 lb (71.2 kg)   BMI 23.18 kg/m      Objective:   Physical Exam  Constitutional: He is oriented to person, place, and time. He appears well-developed and well-nourished.  HENT:  Head: Normocephalic and atraumatic.  Eyes: Pupils are equal, round, and reactive to light. Conjunctivae and EOM are normal.  Neck: Normal range of motion. Neck supple.  Cardiovascular: Normal rate, regular rhythm and intact distal pulses.  Pulmonary/Chest: Effort normal.  Abdominal: Soft.  Musculoskeletal:       Left shoulder: He exhibits tenderness, crepitus and laceration.       Arms: Neurological: He is alert and oriented to person, place, and time. He has normal reflexes. He displays normal reflexes. No cranial nerve deficit. He exhibits normal muscle tone. Coordination normal.  Skin: Skin is warm and dry.  Psychiatric: He has a normal mood and affect. His behavior is normal. Judgment and thought content normal.    X-rays were done of the left shoulder, reported separately.      Assessment & Plan:   Encounter Diagnosis  Name Primary?  . Pain in joint of left shoulder Yes   PROCEDURE NOTE:  The patient request injection, verbal consent was obtained.  The left shoulder was prepped appropriately after time out was performed.   Sterile technique was observed and injection of 1 cc of Depo-Medrol 40 mg with several cc's of plain  xylocaine. Anesthesia was provided by ethyl chloride and a 20-gauge needle was used to inject the shoulder area. A posterior approach was used.  The injection was tolerated well.  A band aid dressing was applied.  The patient was advised to apply ice  later today and tomorrow to the injection sight as needed.  I will see him in three weeks.  Call if any problem.  Precautions discussed.   Electronically Signed Sanjuana Kava, MD 5/16/20199:24 AM

## 2017-07-06 ENCOUNTER — Ambulatory Visit: Payer: PPO | Admitting: Orthopaedic Surgery

## 2017-07-06 ENCOUNTER — Encounter: Payer: Self-pay | Admitting: Orthopaedic Surgery

## 2017-07-06 VITALS — BP 108/63 | HR 66 | Temp 98.2°F | Ht 69.0 in | Wt 153.0 lb

## 2017-07-06 DIAGNOSIS — M25512 Pain in left shoulder: Secondary | ICD-10-CM

## 2017-07-06 NOTE — Progress Notes (Signed)
CC:  My shoulder is much better  He has little pain of his left shoulder today.  He has done well since the injection.  He has no numbness.  He has full ROM of the left shoulder and no pain.  NV intact.  Encounter Diagnosis  Name Primary?  . Pain in joint of left shoulder Yes   I will see as needed.  Call if any problem.  Precautions discussed.   Electronically Signed Sanjuana Kava, MD 6/6/20199:32 AM

## 2017-08-17 DIAGNOSIS — H40033 Anatomical narrow angle, bilateral: Secondary | ICD-10-CM | POA: Diagnosis not present

## 2017-09-12 DIAGNOSIS — E782 Mixed hyperlipidemia: Secondary | ICD-10-CM | POA: Diagnosis not present

## 2017-09-12 DIAGNOSIS — M069 Rheumatoid arthritis, unspecified: Secondary | ICD-10-CM | POA: Diagnosis not present

## 2017-09-12 DIAGNOSIS — I2510 Atherosclerotic heart disease of native coronary artery without angina pectoris: Secondary | ICD-10-CM | POA: Diagnosis not present

## 2017-09-12 DIAGNOSIS — I10 Essential (primary) hypertension: Secondary | ICD-10-CM | POA: Diagnosis not present

## 2017-09-12 DIAGNOSIS — R42 Dizziness and giddiness: Secondary | ICD-10-CM | POA: Diagnosis not present

## 2017-09-12 DIAGNOSIS — R7303 Prediabetes: Secondary | ICD-10-CM | POA: Diagnosis not present

## 2017-09-12 DIAGNOSIS — H409 Unspecified glaucoma: Secondary | ICD-10-CM | POA: Diagnosis not present

## 2017-09-12 DIAGNOSIS — I429 Cardiomyopathy, unspecified: Secondary | ICD-10-CM | POA: Diagnosis not present

## 2017-09-12 DIAGNOSIS — M791 Myalgia, unspecified site: Secondary | ICD-10-CM | POA: Diagnosis not present

## 2017-09-12 DIAGNOSIS — W458XXA Other foreign body or object entering through skin, initial encounter: Secondary | ICD-10-CM | POA: Diagnosis not present

## 2017-09-12 DIAGNOSIS — I259 Chronic ischemic heart disease, unspecified: Secondary | ICD-10-CM | POA: Diagnosis not present

## 2017-09-12 DIAGNOSIS — D3709 Neoplasm of uncertain behavior of other specified sites of the oral cavity: Secondary | ICD-10-CM | POA: Diagnosis not present

## 2017-09-12 DIAGNOSIS — R7301 Impaired fasting glucose: Secondary | ICD-10-CM | POA: Diagnosis not present

## 2017-09-14 DIAGNOSIS — M069 Rheumatoid arthritis, unspecified: Secondary | ICD-10-CM | POA: Diagnosis not present

## 2017-09-14 DIAGNOSIS — M25512 Pain in left shoulder: Secondary | ICD-10-CM | POA: Diagnosis not present

## 2017-09-14 DIAGNOSIS — H409 Unspecified glaucoma: Secondary | ICD-10-CM | POA: Diagnosis not present

## 2017-09-14 DIAGNOSIS — Z6823 Body mass index (BMI) 23.0-23.9, adult: Secondary | ICD-10-CM | POA: Diagnosis not present

## 2017-09-14 DIAGNOSIS — I429 Cardiomyopathy, unspecified: Secondary | ICD-10-CM | POA: Diagnosis not present

## 2017-09-14 DIAGNOSIS — R7301 Impaired fasting glucose: Secondary | ICD-10-CM | POA: Diagnosis not present

## 2017-09-14 DIAGNOSIS — R7303 Prediabetes: Secondary | ICD-10-CM | POA: Diagnosis not present

## 2017-09-14 DIAGNOSIS — I10 Essential (primary) hypertension: Secondary | ICD-10-CM | POA: Diagnosis not present

## 2017-09-19 DIAGNOSIS — K1321 Leukoplakia of oral mucosa, including tongue: Secondary | ICD-10-CM | POA: Diagnosis not present

## 2017-10-26 DIAGNOSIS — Z23 Encounter for immunization: Secondary | ICD-10-CM | POA: Diagnosis not present

## 2017-10-26 DIAGNOSIS — Z0000 Encounter for general adult medical examination without abnormal findings: Secondary | ICD-10-CM | POA: Diagnosis not present

## 2017-12-27 DIAGNOSIS — M069 Rheumatoid arthritis, unspecified: Secondary | ICD-10-CM | POA: Diagnosis not present

## 2017-12-27 DIAGNOSIS — E782 Mixed hyperlipidemia: Secondary | ICD-10-CM | POA: Diagnosis not present

## 2017-12-27 DIAGNOSIS — I10 Essential (primary) hypertension: Secondary | ICD-10-CM | POA: Diagnosis not present

## 2017-12-27 DIAGNOSIS — I259 Chronic ischemic heart disease, unspecified: Secondary | ICD-10-CM | POA: Diagnosis not present

## 2017-12-27 DIAGNOSIS — H409 Unspecified glaucoma: Secondary | ICD-10-CM | POA: Diagnosis not present

## 2018-01-04 DIAGNOSIS — M069 Rheumatoid arthritis, unspecified: Secondary | ICD-10-CM | POA: Diagnosis not present

## 2018-01-04 DIAGNOSIS — E782 Mixed hyperlipidemia: Secondary | ICD-10-CM | POA: Diagnosis not present

## 2018-01-04 DIAGNOSIS — I10 Essential (primary) hypertension: Secondary | ICD-10-CM | POA: Diagnosis not present

## 2018-01-04 DIAGNOSIS — I259 Chronic ischemic heart disease, unspecified: Secondary | ICD-10-CM | POA: Diagnosis not present

## 2018-01-16 ENCOUNTER — Telehealth: Payer: Self-pay

## 2018-01-16 NOTE — Telephone Encounter (Signed)
Opened in error

## 2018-02-02 ENCOUNTER — Encounter: Payer: Self-pay | Admitting: Cardiovascular Disease

## 2018-02-02 ENCOUNTER — Ambulatory Visit: Payer: PPO | Admitting: Cardiovascular Disease

## 2018-02-02 VITALS — BP 118/63 | HR 65 | Ht 69.0 in | Wt 157.8 lb

## 2018-02-02 DIAGNOSIS — I10 Essential (primary) hypertension: Secondary | ICD-10-CM | POA: Diagnosis not present

## 2018-02-02 DIAGNOSIS — I447 Left bundle-branch block, unspecified: Secondary | ICD-10-CM

## 2018-02-02 DIAGNOSIS — I428 Other cardiomyopathies: Secondary | ICD-10-CM

## 2018-02-02 DIAGNOSIS — E7800 Pure hypercholesterolemia, unspecified: Secondary | ICD-10-CM | POA: Insufficient documentation

## 2018-02-02 NOTE — Patient Instructions (Signed)
Medication Instructions:  Dr Croitoru recommends that you continue on your current medications as directed. Please refer to the Current Medication list given to you today.  If you need a refill on your cardiac medications before your next appointment, please call your pharmacy.   Follow-Up: At [facility], you and your health needs are our priority.  As part of our continuing mission to provide you with exceptional heart care, we have created designated Provider Care Teams.  These Care Teams include your primary Cardiologist (physician) and Advanced Practice Providers (APPs -  Physician Assistants and Nurse Practitioners) who all work together to provide you with the care you need, when you need it. You will need a follow up appointment in 12 months.  Please call our office 2 months in advance to schedule this appointment.  You may see Mihai Croitoru, MD or one of the following Advanced Practice Providers on your designated Care Team: Hao Meng, PA-C . Jaecob Lowden Duke, PA-C . You will receive a reminder letter in the mail two months in advance. If you don't receive a letter, please call our office to schedule the follow-up appointment. 

## 2018-02-02 NOTE — Progress Notes (Signed)
Cardiology Office Note    Date:  02/02/2018   ID:  Matthew Slates., DOB [birth year], MRN [id number]  PCP:  Celene Squibb, MD  Cardiologist:   Sanda Klein, MD   Chief Complaint  Patient presents with  . Follow-up    LBBB, LV dysfunction    History of Present Illness:  Matthew Celani. is a 78 y.o. male with mild nonischemic cardiomyopathy, rate related left bundle branch block, hypertension returning for follow-up.   He feels great.  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  He goes to the [facility] 4 days a week if the weather permits he walks 4 miles a day.   The patient specifically denies any chest pain at rest exertion, dyspnea at rest or with exertion, orthopnea, paroxysmal nocturnal dyspnea, syncope, palpitations, focal neurological deficits, intermittent claudication, lower extremity edema, unexplained weight gain, cough, hemoptysis or wheezing.  He does take methotrexate for rheumatoid arthritis, generally arthralgias are well controlled.  He has left bundle branch block on most of his tracings, although when his heart rate was particularly slow a few years ago,  he conducted with a narrow QRS.  Today he has a broad QRS at 148 ms with typical left bundle branch block morphology.  He has mildly depressed left ventricular systolic function with an ejection fraction of 45-50% (last echo reviewed 2011) and is taking both ace inhibitors and beta blockers.  He had a normal nuclear treadmill stress test in 2010, exercised to 13 mets.  He continues to take methotrexate for rheumatoid arthritis.   He is taking a statin for hyperlipidemia.  In April 2019 LDL was 88.   Past Medical History:  Diagnosis Date  . Arthritis    RA  . Colon polyps   . Depression   . Dyslipidemia   . Hypertension   . LBBB (left bundle branch block)    Which comes and goes.  Marland Kitchen NICM (nonischemic cardiomyopathy) ([place])    Echo 09/22/09, EF 45-50%  . Shortness of breath    exertion      Past Surgical History:  Procedure Laterality Date  . BACK SURGERY    . CARDIAC CATHETERIZATION  09/22/09   Mild non-obstructive CAD  . CARDIOVASCULAR STRESS TEST  06/23/2008   Exercise:  normal perfusion. Low risk.  . COLONOSCOPY  10/12/2011   Procedure: COLONOSCOPY;  Surgeon: Rogene Houston, MD;  [facility];  Service: Endoscopy;  Laterality: N/A;  930  . COLONOSCOPY N/A 11/10/2016   Procedure: COLONOSCOPY;  Surgeon: Rogene Houston, MD;  Location[facility];  Service: Endoscopy;  Laterality: N/A;  930  . POLYPECTOMY  11/10/2016   Procedure: POLYPECTOMY;  Surgeon: Rogene Houston, MD;  Location: [facility];  Service: Endoscopy;;  colon  . TRANSTHORACIC ECHOCARDIOGRAM  09/22/09   EF 45-50%.  No significant valvular disease.     Current Medications: Outpatient Medications Prior to Visit  Medication Sig Dispense Refill  . amLODipine (NORVASC) 10 MG tablet Take 1 tablet by mouth daily.    Marland Kitchen aspirin EC 81 MG tablet Take 1 tablet (81 mg total) by mouth daily.    . carvedilol (COREG) 6.25 MG tablet Take 1 tablet (6.25 mg total) by mouth 2 (two) times daily. 60 tablet 11  . dorzolamide-timolol (COSOPT) 22.3-6.8 MG/ML ophthalmic solution Place 1 drop into both eyes 2 (two) times daily.    . folic acid (FOLVITE) 1 MG tablet Take 1 mg by mouth  daily.    . latanoprost (XALATAN) 0.005 % ophthalmic solution Place 1 drop into both eyes at bedtime.     Marland Kitchen lisinopril (PRINIVIL,ZESTRIL) 40 MG tablet Take 40 mg by mouth daily.    . methotrexate (RHEUMATREX) 2.5 MG tablet Take 20 mg by mouth once a week.     . multivitamin (ONE-A-DAY MEN'S) TABS tablet Take 1 tablet by mouth daily.    . Omega-3 350 MG CPDR Take 350 mg by mouth daily.     No facility-administered medications prior to visit.      Allergies:   Patient has no known allergies.   Social History   Socioeconomic History  . Marital status: Married    Spouse name: Not on file  . Number of children: Not on file  .  Years of education: Not on file  . Highest education level: Not on file  Occupational History  . Not on file  Social Needs  . Financial resource strain: Not on file  . Food insecurity:    Worry: Not on file    Inability: Not on file  . Transportation needs:    Medical: Not on file    Non-medical: Not on file  Tobacco Use  . Smoking status: Former Smoker    Years: 15.00  . Smokeless tobacco: Never Used  Substance and Sexual Activity  . Alcohol use: No  . Drug use: No  . Sexual activity: Not on file  Lifestyle  . Physical activity:    Days per week: Not on file    Minutes per session: Not on file  . Stress: Not on file  Relationships  . Social connections:    Talks on phone: Not on file    Gets together: Not on file    Attends religious service: Not on file    Active member of club or organization: Not on file    Attends meetings of clubs or organizations: Not on file    Relationship status: Not on file  Other Topics Concern  . Not on file  Social History Narrative  . Not on file      ROS:   Please see the history of present illness.    ROS all other systems are reviewed and are negative   PHYSICAL EXAM:   VS:  BP 118/63   Pulse 65   Ht 5\' 9"  (1.753 m)   Wt 157 lb 12.8 oz (71.6 kg)   BMI 23.30 kg/m      General: Alert, oriented x3, no distress, appears well and fit Head: no evidence of trauma, PERRL, EOMI, no exophtalmos or lid lag, no myxedema, no xanthelasma; normal ears, nose and oropharynx Neck: normal jugular venous pulsations and no hepatojugular reflux; brisk carotid pulses without delay and no carotid bruits Chest: clear to auscultation, no signs of consolidation by percussion or palpation, normal fremitus, symmetrical and full respiratory excursions Cardiovascular: normal position and quality of the apical impulse, regular rhythm, normal first and paradoxically split second heart sounds, no murmurs, rubs or gallops Abdomen: no tenderness or  distention, no masses by palpation, no abnormal pulsatility or arterial bruits, normal bowel sounds, no hepatosplenomegaly Extremities: no clubbing, cyanosis or edema; 2+ radial, ulnar and brachial pulses bilaterally; 2+ right femoral, posterior tibial and dorsalis pedis pulses; 2+ left femoral, posterior tibial and dorsalis pedis pulses; no subclavian or femoral bruits Neurological: grossly nonfocal Psych: Normal mood and affect    Wt Readings from Last 3 Encounters:  02/02/18 157 lb 12.8 oz (71.6  kg)  07/06/17 153 lb (69.4 kg)  06/15/17 157 lb (71.2 kg)      Studies/Labs Reviewed:   EKG:  EKG is ordered today.  The ekg ordered today shows sinus rhythm with first-degree AV block (PR 210 ms), left bundle branch block (on and 48 ms), QTC 465 ms  Recent Labs: No results found for requested labs within last 8760 hours.   Lipid Panel    Component Value Date/Time   CHOL  09/23/2009 0406    109        ATP III CLASSIFICATION:  <200     mg/dL   Desirable  200-239  mg/dL   Borderline High  >=240    mg/dL   High          TRIG 28 09/23/2009 0406   HDL 45 09/23/2009 0406   CHOLHDL 2.4 09/23/2009 0406   VLDL 6 09/23/2009 0406   LDLCALC  09/23/2009 0406    58        Total Cholesterol/HDL:CHD Risk Coronary Heart Disease Risk Table                     Men   Women  1/2 Average Risk   3.4   3.3  Average Risk       5.0   4.4  2 X Average Risk   9.6   7.1  3 X Average Risk  23.4   11.0        Use the calculated Patient Ratio above and the CHD Risk Table to determine the patient's CHD Risk.        ATP III CLASSIFICATION (LDL):  <100     mg/dL   Optimal  100-129  mg/dL   Near or Above                    Optimal  130-159  mg/dL   Borderline  160-189  mg/dL   High  >190     mg/dL   Very High      ASSESSMENT:    1. NICM (nonischemic cardiomyopathy): last echo 09/22/09.  EF 45-50%   2. Essential hypertension   3. LBBB (left bundle branch block),  Intermitent    4.  Hypercholesterolemia      PLAN:  In order of problems listed above:  1. CMP: Despite mildly depressed LVEF he does not have any signs or symptoms of heart failure and is maintaining euvolemia without diuretics..  On appropriate ACE inhibitor and beta-blocker therapy, well tolerated.  NYHA functional class I. 2. HTN: Good control 3. rate related LBBB: No signs or symptoms of bradycardia or heart block.  There is evidence of slow progression of conduction abnormality, now with a longer PR interval.  No indication for pacemaker at this time, but he is requested to call promptly if he develops syncope, fatigue, exertional dyspnea. 4. HLP: Labs monitored by Dr. Nevada Crane, all lipid parameters in the desirable range at labs in April 2019 (target LDL less than 100 in the absence of known vascular disease).   Medication Adjustments/Labs and Tests Ordered: Current medicines are reviewed at length with the patient today.  Concerns regarding medicines are outlined above.  Medication changes, Labs and Tests ordered today are listed in the Patient Instructions below. Patient Instructions  Medication Instructions:  Dr Sallyanne Kuster recommends that you continue on your current medications as directed. Please refer to the Current Medication list given to you today.  If you need a refill on  your cardiac medications before your next appointment, please call your pharmacy.   Follow-Up: At [facility], you and your health needs are our priority.  As part of our continuing mission to provide you with exceptional heart care, we have created designated Provider Care Teams.  These Care Teams include your primary Cardiologist (physician) and Advanced Practice Providers (APPs -  Physician Assistants and Nurse Practitioners) who all work together to provide you with the care you need, when you need it. You will need a follow up appointment in 12 months.  Please call our office 2 months in advance to schedule this appointment.   You may see Sanda Klein, MD or one of the following Advanced Practice Providers on your designated Care Team: [place], [place] . Fabian Sharp, PA-C . You will receive a reminder letter in the mail two months in advance. If you don't receive a letter, please call our office to schedule the follow-up appointment.    Signed, Sanda Klein, MD  02/02/2018 8:17 AM    [facility] [place], [place], [place]  [address] Phone: [phone]; Fax: [phone]

## 2018-02-09 DIAGNOSIS — I259 Chronic ischemic heart disease, unspecified: Secondary | ICD-10-CM | POA: Diagnosis not present

## 2018-02-09 DIAGNOSIS — E782 Mixed hyperlipidemia: Secondary | ICD-10-CM | POA: Diagnosis not present

## 2018-02-09 DIAGNOSIS — R7301 Impaired fasting glucose: Secondary | ICD-10-CM | POA: Diagnosis not present

## 2018-02-09 DIAGNOSIS — I10 Essential (primary) hypertension: Secondary | ICD-10-CM | POA: Diagnosis not present

## 2018-02-15 DIAGNOSIS — H40033 Anatomical narrow angle, bilateral: Secondary | ICD-10-CM | POA: Diagnosis not present

## 2018-03-02 DIAGNOSIS — M25512 Pain in left shoulder: Secondary | ICD-10-CM | POA: Diagnosis not present

## 2018-03-14 DIAGNOSIS — M25512 Pain in left shoulder: Secondary | ICD-10-CM | POA: Diagnosis not present

## 2018-03-20 DIAGNOSIS — M75122 Complete rotator cuff tear or rupture of left shoulder, not specified as traumatic: Secondary | ICD-10-CM | POA: Diagnosis not present

## 2018-03-20 DIAGNOSIS — M25512 Pain in left shoulder: Secondary | ICD-10-CM | POA: Diagnosis not present

## 2018-03-22 DIAGNOSIS — I10 Essential (primary) hypertension: Secondary | ICD-10-CM | POA: Diagnosis not present

## 2018-03-22 DIAGNOSIS — R7303 Prediabetes: Secondary | ICD-10-CM | POA: Diagnosis not present

## 2018-03-22 DIAGNOSIS — R7301 Impaired fasting glucose: Secondary | ICD-10-CM | POA: Diagnosis not present

## 2018-03-22 DIAGNOSIS — E782 Mixed hyperlipidemia: Secondary | ICD-10-CM | POA: Diagnosis not present

## 2018-03-22 DIAGNOSIS — I259 Chronic ischemic heart disease, unspecified: Secondary | ICD-10-CM | POA: Diagnosis not present

## 2018-03-29 DIAGNOSIS — M069 Rheumatoid arthritis, unspecified: Secondary | ICD-10-CM | POA: Diagnosis not present

## 2018-03-29 DIAGNOSIS — R7301 Impaired fasting glucose: Secondary | ICD-10-CM | POA: Diagnosis not present

## 2018-03-29 DIAGNOSIS — H409 Unspecified glaucoma: Secondary | ICD-10-CM | POA: Diagnosis not present

## 2018-03-29 DIAGNOSIS — I429 Cardiomyopathy, unspecified: Secondary | ICD-10-CM | POA: Diagnosis not present

## 2018-03-29 DIAGNOSIS — I10 Essential (primary) hypertension: Secondary | ICD-10-CM | POA: Diagnosis not present

## 2018-03-29 DIAGNOSIS — M25512 Pain in left shoulder: Secondary | ICD-10-CM | POA: Diagnosis not present

## 2018-03-29 DIAGNOSIS — R7303 Prediabetes: Secondary | ICD-10-CM | POA: Diagnosis not present

## 2018-04-02 DIAGNOSIS — M069 Rheumatoid arthritis, unspecified: Secondary | ICD-10-CM | POA: Diagnosis not present

## 2018-04-02 DIAGNOSIS — R7303 Prediabetes: Secondary | ICD-10-CM | POA: Diagnosis not present

## 2018-04-02 DIAGNOSIS — H409 Unspecified glaucoma: Secondary | ICD-10-CM | POA: Diagnosis not present

## 2018-04-02 DIAGNOSIS — I429 Cardiomyopathy, unspecified: Secondary | ICD-10-CM | POA: Diagnosis not present

## 2018-04-02 DIAGNOSIS — I10 Essential (primary) hypertension: Secondary | ICD-10-CM | POA: Diagnosis not present

## 2018-04-02 DIAGNOSIS — R7301 Impaired fasting glucose: Secondary | ICD-10-CM | POA: Diagnosis not present

## 2018-06-13 DIAGNOSIS — Z0000 Encounter for general adult medical examination without abnormal findings: Secondary | ICD-10-CM | POA: Diagnosis not present

## 2018-06-19 DIAGNOSIS — H25011 Cortical age-related cataract, right eye: Secondary | ICD-10-CM | POA: Diagnosis not present

## 2018-06-19 DIAGNOSIS — H25811 Combined forms of age-related cataract, right eye: Secondary | ICD-10-CM | POA: Diagnosis not present

## 2018-06-19 DIAGNOSIS — H2511 Age-related nuclear cataract, right eye: Secondary | ICD-10-CM | POA: Diagnosis not present

## 2018-07-10 DIAGNOSIS — H2512 Age-related nuclear cataract, left eye: Secondary | ICD-10-CM | POA: Diagnosis not present

## 2018-07-10 DIAGNOSIS — H409 Unspecified glaucoma: Secondary | ICD-10-CM | POA: Diagnosis not present

## 2018-07-10 DIAGNOSIS — R7303 Prediabetes: Secondary | ICD-10-CM | POA: Diagnosis not present

## 2018-07-10 DIAGNOSIS — M069 Rheumatoid arthritis, unspecified: Secondary | ICD-10-CM | POA: Diagnosis not present

## 2018-07-10 DIAGNOSIS — R7301 Impaired fasting glucose: Secondary | ICD-10-CM | POA: Diagnosis not present

## 2018-07-10 DIAGNOSIS — I10 Essential (primary) hypertension: Secondary | ICD-10-CM | POA: Diagnosis not present

## 2018-07-10 DIAGNOSIS — I429 Cardiomyopathy, unspecified: Secondary | ICD-10-CM | POA: Diagnosis not present

## 2018-07-10 DIAGNOSIS — H25012 Cortical age-related cataract, left eye: Secondary | ICD-10-CM | POA: Diagnosis not present

## 2018-07-10 DIAGNOSIS — H25812 Combined forms of age-related cataract, left eye: Secondary | ICD-10-CM | POA: Diagnosis not present

## 2018-07-19 NOTE — Progress Notes (Signed)
LOV DR CROITORU 02-02-2018 CARDIOLOGY Epic EKG 02-02-18 Epic

## 2018-07-19 NOTE — Patient Instructions (Addendum)
YOU ARE REQUIRED TO BE TESTED FOR COVID-19 PRIOR TO YOUR SURGERY . YOUR TEST MUST BE COMPLETED ON Saturday June 20th . TESTING IS LOCATED AT [facility] ENTRANCE FROM 9:00AM - 3:00PM. FAILURE TO COMPLETE TESTING MAY RESULT IN CANCELLATION OF YOUR SURGERY. Marland Kitchen ONCE YOUR COVID TEST IS COMPLETED, PLEASE BEGIN THE QUARANTINE INSTRUCTIONS AS OUTLINED IN YOUR HANDOUT.                Matthew Weiss.    Your procedure is scheduled on: 07-25-2018   Report to [facility] Main  Entrance    Report to admitting at 8:00 AM     Call this number if you have problems the morning of surgery [phone]    Remember:  [place], NO [place].    NO SOLID FOOD AFTER MIDNIGHT THE NIGHT PRIOR TO SURGERY. NOTHING BY MOUTH EXCEPT CLEAR LIQUIDS UNTIL 7:00 AM. PLEASE FINISH ENSURE DRINK PER SURGEON ORDER 3 HOURS PRIOR TO SCHEDULED SURGERY TIME WHICH NEEDS TO BE COMPLETED AT 7:00 AM. NOTHING BY MOUTH AFTER 7:00AM   CLEAR LIQUID DIET   Foods Allowed                                                                     Foods Excluded  Coffee and tea, regular and decaf                             liquids that you cannot  Plain Jell-O in any flavor                                             see through such as: Fruit ices (not with fruit pulp)                                     milk, soups, orange juice  Iced Popsicles                                    All solid food Carbonated beverages, regular and diet                                    Cranberry, grape and apple juices Sports drinks like Gatorade Lightly seasoned clear broth or consume(fat free) Sugar, honey syrup  Sample Menu Breakfast                                Lunch                                     Supper Cranberry juice  Beef broth                            Chicken broth Jell-O                                     Grape  juice                           Apple juice Coffee or tea                        Jell-O                                      Popsicle                                                Coffee or tea                        Coffee or tea  _____________________________________________________________________     Take these medicines the morning of surgery with A SIP OF WATER: CARVEDILOL (COREG), AMLODIPINE (NORVASC), EYE DROPS               You may not have any metal on your body including hair pins and              piercings  Do not wear jewelry, make-up, lotions, powders or perfumes, deodorant                        Men may shave face and neck.   Do not bring valuables to the hospital. [place].  Contacts, dentures or bridgework may not be worn into surgery.  Leave suitcase in the car. After surgery it may be brought to your room.      _____________________________________________________________________             [facility] - Preparing for Surgery Before surgery, you can play an important role.  Because skin is not sterile, your skin needs to be as free of germs as possible.  You can reduce the number of germs on your skin by washing with CHG (chlorahexidine gluconate) soap before surgery.  CHG is an antiseptic cleaner which kills germs and bonds with the skin to continue killing germs even after washing. Please DO NOT use if you have an allergy to CHG or antibacterial soaps.  If your skin becomes reddened/irritated stop using the CHG and inform your nurse when you arrive at Short Stay. Do not shave (including legs and underarms) for at least 48 hours prior to the first CHG shower.  You may shave your face/neck. Please follow these instructions carefully:  1.  Shower with CHG Soap the night before surgery and the  morning of Surgery.  2.  If you choose to wash your hair, wash your hair first as usual with your  normal  shampoo.  3.   After you shampoo,  rinse your hair and body thoroughly to remove the  shampoo.                           4.  Use CHG as you would any other liquid soap.  You can apply chg directly  to the skin and wash                       Gently with a scrungie or clean washcloth.  5.  Apply the CHG Soap to your body ONLY FROM THE NECK DOWN.   Do not use on face/ open                           Wound or open sores. Avoid contact with eyes, ears mouth and genitals (private parts).                       Wash face,  Genitals (private parts) with your normal soap.             6.  Wash thoroughly, paying special attention to the area where your surgery  will be performed.  7.  Thoroughly rinse your body with warm water from the neck down.  8.  DO NOT shower/wash with your normal soap after using and rinsing off  the CHG Soap.                9.  Pat yourself dry with a clean towel.            10.  Wear clean pajamas.            11.  Place clean sheets on your bed the night of your first shower and do not  sleep with pets. Day of Surgery : Do not apply any lotions/deodorants the morning of surgery.  Please wear clean clothes to the hospital/surgery center.  FAILURE TO FOLLOW THESE INSTRUCTIONS MAY RESULT IN THE CANCELLATION OF YOUR SURGERY PATIENT SIGNATURE_________________________________  NURSE SIGNATURE__________________________________  ________________________________________________________________________   Matthew Weiss  An incentive spirometer is a tool that can help keep your lungs clear and active. This tool measures how well you are filling your lungs with each breath. Taking long deep breaths may help reverse or decrease the chance of developing breathing (pulmonary) problems (especially infection) following:  A long period of time when you are unable to move or be active. BEFORE THE PROCEDURE   If the spirometer includes an indicator to show your best effort, your nurse or respiratory  therapist will set it to a desired goal.  If possible, sit up straight or lean slightly forward. Try not to slouch.  Hold the incentive spirometer in an upright position. INSTRUCTIONS FOR USE  1. Sit on the edge of your bed if possible, or sit up as far as you can in bed or on a chair. 2. Hold the incentive spirometer in an upright position. 3. Breathe out normally. 4. Place the mouthpiece in your mouth and seal your lips tightly around it. 5. Breathe in slowly and as deeply as possible, raising the piston or the ball toward the top of the column. 6. Hold your breath for 3-5 seconds or for as long as possible. Allow the piston or ball to fall to the bottom of the column. 7. Remove the mouthpiece from your mouth and breathe out normally. 8. Rest for a few seconds and  repeat Steps 1 through 7 at least 10 times every 1-2 hours when you are awake. Take your time and take a few normal breaths between deep breaths. 9. The spirometer may include an indicator to show your best effort. Use the indicator as a goal to work toward during each repetition. 10. After each set of 10 deep breaths, practice coughing to be sure your lungs are clear. If you have an incision (the cut made at the time of surgery), support your incision when coughing by placing a pillow or rolled up towels firmly against it. Once you are able to get out of bed, walk around indoors and cough well. You may stop using the incentive spirometer when instructed by your caregiver.  RISKS AND COMPLICATIONS  Take your time so you do not get dizzy or light-headed.  If you are in pain, you may need to take or ask for pain medication before doing incentive spirometry. It is harder to take a deep breath if you are having pain. AFTER USE  Rest and breathe slowly and easily.  It can be helpful to keep track of a log of your progress. Your caregiver can provide you with a simple table to help with this. If you are using the spirometer at home,  follow these instructions: [place] IF:   You are having difficultly using the spirometer.  You have trouble using the spirometer as often as instructed.  Your pain medication is not giving enough relief while using the spirometer.  You develop fever of 100.5 F (38.1 C) or higher. SEEK IMMEDIATE MEDICAL CARE IF:   You cough up bloody sputum that had not been present before.  You develop fever of 102 F (38.9 C) or greater.  You develop worsening pain at or near the incision site. MAKE SURE YOU:   Understand these instructions.  Will watch your condition.  Will get help right away if you are not doing well or get worse. Document Released: 05/30/2006 Document Revised: 04/11/2011 Document Reviewed: 07/31/2006 ExitCare Patient Information 2014 ExitCare, [place].   ________________________________________________________________________  WHAT IS A BLOOD TRANSFUSION? Blood Transfusion Information  A transfusion is the replacement of blood or some of its parts. Blood is made up of multiple cells which provide different functions.  Red blood cells carry oxygen and are used for blood loss replacement.  White blood cells fight against infection.  Platelets control bleeding.  Plasma helps clot blood.  Other blood products are available for specialized needs, such as hemophilia or other clotting disorders. BEFORE THE TRANSFUSION  Who gives blood for transfusions?   Healthy volunteers who are fully evaluated to make sure their blood is safe. This is blood bank blood. Transfusion therapy is the safest it has ever been in the practice of medicine. Before blood is taken from a donor, a complete history is taken to make sure that person has no history of diseases nor engages in risky social behavior (examples are intravenous drug use or sexual activity with multiple partners). The donor's travel history is screened to minimize risk of transmitting infections, such as malaria.  The donated blood is tested for signs of infectious diseases, such as HIV and hepatitis. The blood is then tested to be sure it is compatible with you in order to minimize the chance of a transfusion reaction. If you or a relative donates blood, this is often done in anticipation of surgery and is not appropriate for emergency situations. It takes many days to process the donated  blood. RISKS AND COMPLICATIONS Although transfusion therapy is very safe and saves many lives, the main dangers of transfusion include:   Getting an infectious disease.  Developing a transfusion reaction. This is an allergic reaction to something in the blood you were given. Every precaution is taken to prevent this. The decision to have a blood transfusion has been considered carefully by your caregiver before blood is given. Blood is not given unless the benefits outweigh the risks. AFTER THE TRANSFUSION  Right after receiving a blood transfusion, you will usually feel much better and more energetic. This is especially true if your red blood cells have gotten low (anemic). The transfusion raises the level of the red blood cells which carry oxygen, and this usually causes an energy increase.  The nurse administering the transfusion will monitor you carefully for complications. HOME CARE INSTRUCTIONS  No special instructions are needed after a transfusion. You may find your energy is better. Speak with your caregiver about any limitations on activity for underlying diseases you may have. SEEK MEDICAL CARE IF:   Your condition is not improving after your transfusion.  You develop redness or irritation at the intravenous (IV) site. SEEK IMMEDIATE MEDICAL CARE IF:  Any of the following symptoms occur over the next 12 hours:  Shaking chills.  You have a temperature by mouth above 102 F (38.9 C), not controlled by medicine.  Chest, back, or muscle pain.  People around you feel you are not acting correctly or are  confused.  Shortness of breath or difficulty breathing.  Dizziness and fainting.  You get a rash or develop hives.  You have a decrease in urine output.  Your urine turns a dark color or changes to pink, red, or brown. Any of the following symptoms occur over the next 10 days:  You have a temperature by mouth above 102 F (38.9 C), not controlled by medicine.  Shortness of breath.  Weakness after normal activity.  The white part of the eye turns yellow (jaundice).  You have a decrease in the amount of urine or are urinating less often.  Your urine turns a dark color or changes to pink, red, or brown. Document Released: 01/15/2000 Document Revised: 04/11/2011 Document Reviewed: 09/03/2007 [facility] Patient Information 2014 [place], [place].  _______________________________________________________________________

## 2018-07-20 ENCOUNTER — Encounter (HOSPITAL_COMMUNITY)
Admission: RE | Admit: 2018-07-20 | Discharge: 2018-07-20 | Disposition: A | Discharge status: Home / Self Care | Payer: PPO | Source: Ambulatory Visit | Attending: Orthopedic Surgery | Admitting: Orthopedic Surgery

## 2018-07-20 ENCOUNTER — Encounter (HOSPITAL_COMMUNITY): Payer: Self-pay

## 2018-07-20 ENCOUNTER — Other Ambulatory Visit: Payer: Self-pay

## 2018-07-20 DIAGNOSIS — Z1159 Encounter for screening for other viral diseases: Secondary | ICD-10-CM | POA: Insufficient documentation

## 2018-07-20 DIAGNOSIS — Z01812 Encounter for preprocedural laboratory examination: Secondary | ICD-10-CM | POA: Insufficient documentation

## 2018-07-20 HISTORY — DX: Unspecified rotator cuff tear or rupture of unspecified shoulder, not specified as traumatic: M75.100

## 2018-07-20 LAB — COMPREHENSIVE METABOLIC PANEL
ALT: 15 U/L (ref 0–44)
AST: 16 U/L (ref 15–41)
Albumin: 4.1 g/dL (ref 3.5–5.0)
Alkaline Phosphatase: 71 U/L (ref 38–126)
Anion gap: 7 (ref 5–15)
BUN: 17 mg/dL (ref 8–23)
CO2: 25 mmol/L (ref 22–32)
Calcium: 9.3 mg/dL (ref 8.9–10.3)
Chloride: 109 mmol/L (ref 98–111)
Creatinine, Ser: 1.06 mg/dL (ref 0.61–1.24)
GFR calc Af Amer: >60 mL/min (ref >60)
GFR calc non Af Amer: >60 mL/min (ref >60)
Glucose, Bld: 105 mg/dL — ABNORMAL HIGH (ref 70–99)
Potassium: 4.4 mmol/L (ref 3.5–5.1)
Sodium: 141 mmol/L (ref 135–145)
Total Bilirubin: 0.5 mg/dL (ref 0.3–1.2)
Total Protein: 7.5 g/dL (ref 6.5–8.1)

## 2018-07-20 LAB — ABO/RH: ABO/RH(D): O POS

## 2018-07-20 LAB — PROTIME-INR
INR: 0.9 (ref 0.8–1.2)
Prothrombin Time: 12.5 seconds (ref 11.4–15.2)

## 2018-07-20 LAB — CBC WITH DIFFERENTIAL/PLATELET
Abs Immature Granulocytes: 0.02 10*3/uL (ref 0.00–0.07)
Basophils Absolute: 0.1 10*3/uL (ref 0.0–0.1)
Basophils Relative: 1 %
Eosinophils Absolute: 0.2 10*3/uL (ref 0.0–0.5)
Eosinophils Relative: 4 %
HCT: 41.5 % (ref 39.0–52.0)
Hemoglobin: 13.1 g/dL (ref 13.0–17.0)
Immature Granulocytes: 0 %
Lymphocytes Relative: 19 %
Lymphs Abs: 1.3 10*3/uL (ref 0.7–4.0)
MCH: 29.6 pg (ref 26.0–34.0)
MCHC: 31.6 g/dL (ref 30.0–36.0)
MCV: 93.9 fL (ref 80.0–100.0)
Monocytes Absolute: 0.6 10*3/uL (ref 0.1–1.0)
Monocytes Relative: 10 %
Neutro Abs: 4.4 10*3/uL (ref 1.7–7.7)
Neutrophils Relative %: 66 %
Platelets: 202 10*3/uL (ref 150–400)
RBC: 4.42 MIL/uL (ref 4.22–5.81)
RDW: 14.4 % (ref 11.5–15.5)
WBC: 6.6 10*3/uL (ref 4.0–10.5)
nRBC: 0 % (ref 0.0–0.2)

## 2018-07-20 LAB — APTT: aPTT: 28 seconds (ref 24–36)

## 2018-07-21 ENCOUNTER — Other Ambulatory Visit (HOSPITAL_COMMUNITY)
Admission: RE | Admit: 2018-07-21 | Discharge: 2018-07-21 | Disposition: A | Discharge status: Home / Self Care | Payer: PPO | Source: Ambulatory Visit | Attending: Orthopedic Surgery | Admitting: Orthopedic Surgery

## 2018-07-21 DIAGNOSIS — Z01812 Encounter for preprocedural laboratory examination: Secondary | ICD-10-CM | POA: Diagnosis not present

## 2018-07-22 LAB — SARS CORONAVIRUS 2 (TAT 6-24 HRS)

## 2018-07-23 NOTE — Anesthesia Preprocedure Evaluation (Addendum)
Anesthesia Evaluation  Patient identified by MRN, date of birth, ID band Patient awake    Reviewed: Allergy & Precautions, NPO status , Patient's Chart, lab work & pertinent test results  Airway Mallampati: II  TM Distance: >3 FB Neck ROM: Full    Dental  (+) Chipped,    Pulmonary former smoker,  Pulmonary fibrosis per patient    Pulmonary exam normal breath sounds clear to auscultation       Cardiovascular Exercise Tolerance: Good hypertension, Pt. on medications and Pt. on home beta blockers +CHF  Normal cardiovascular exam Rhythm:Regular Rate:Normal  ECG: SR, 1st degree AV block, rate 65  Pt last seen by cardiologist, Dr. Sanda Klein, 02/02/2018   Neuro/Psych PSYCHIATRIC DISORDERS Depression negative neurological ROS     GI/Hepatic negative GI ROS, Neg liver ROS,   Endo/Other  negative endocrine ROS  Renal/GU negative Renal ROS  negative genitourinary   Musculoskeletal  (+) Arthritis , Osteoarthritis and Rheumatoid disorders,    Abdominal   Peds  Hematology negative hematology ROS (+)   Anesthesia Other Findings Left shoulder rotator cuff tear with retraction  Reproductive/Obstetrics                           Anesthesia Physical Anesthesia Plan  ASA: II  Anesthesia Plan: Regional and General   Post-op Pain Management: GA combined w/ Regional for post-op pain   Induction: Intravenous  PONV Risk Score and Plan: 2 and Ondansetron, Dexamethasone, Midazolam and Treatment may vary due to age or medical condition  Airway Management Planned: Oral ETT  Additional Equipment:   Intra-op Plan:   Post-operative Plan: Extubation in OR  Informed Consent: I have reviewed the patients History and Physical, chart, labs and discussed the procedure including the risks, benefits and alternatives for the proposed anesthesia with the patient or authorized representative who has indicated  his/her understanding and acceptance.     Dental advisory given  Plan Discussed with: CRNA  Anesthesia Plan Comments: (Reviewed PAT note 07/20/2018, Konrad Felix, PA-C)      Anesthesia Quick Evaluation

## 2018-07-23 NOTE — Progress Notes (Signed)
Anesthesia Chart Review   Case: [id number] Date/Time: 07/25/18 0945   Procedure: Left shoulder rotator cuff repair with graft and anchors (Left ) - 57min   Anesthesia type: General   Pre-op diagnosis: Left shoulder rotator cuff tear with retraction   Location: [facility]OM 08 / [facility]   Surgeon: Latanya Maudlin, MD      DISCUSSION:78 y.o. former smoker with h/o HTN, nonischemic cardiomyopathy, dyslipidemia, LBBB, depression, left shoulder rotator cuff tear with retraction scheduled for above procedure 07/25/2018 with Dr. Latanya Maudlin.    Pt last seen by cardiologist, Dr. Sanda Klein, 02/02/2018.  Per his OV note, he does not have any signs or symptoms of heart failure and is maintaining euvolemic without diuretics, HTN with good control, no signs or symptoms or bradycardia or heart block with no indications for pacemaker.    Pt can proceed with palmed procedure barring acute status change.  VS: BP 137/73   Pulse 63   Temp 36.7 C (Oral)   Resp 16   Ht 5\' 9"  (1.753 m)   Wt 71.2 kg   SpO2 100%   BMI 23.18 kg/m   PROVIDERS: Celene Squibb, MD is PCP   Croitoru, Dani Gobble, MD is Cardiologist  LABS: Labs reviewed: Acceptable for surgery. (all labs ordered are listed, but only abnormal results are displayed)  Labs Reviewed  COMPREHENSIVE METABOLIC PANEL - Abnormal; Notable for the following components:      Result Value   Glucose, Bld 105 (*)    All other components within normal limits  APTT  CBC WITH DIFFERENTIAL/PLATELET  PROTIME-INR  TYPE AND SCREEN  ABO/RH     IMAGES:   EKG: 02/02/2018 Rate 65 bpm Sinus rhythm with 1st degree AV block  Left bundle branch block   CV: Echo 09/22/2009 Study Conclusions   - Left ventricle: The cavity size was mildly dilated. Wall thickness   was increased in a pattern of mild LVH. Systolic function was   mildly reduced. The estimated ejection fraction was in the range   of 45% to 50%. Mild globalhypokinesis. There is incoordinate    septal motion. Doppler parameters are consistent withimpaired left   ventricular relaxation (Stage 1 diastolic dysfunction). The E/e'   is 75/6.24 (>10), suggesting elevated LV filling pressure.  - Left atrium: The atrium was mildly dilated.  Past Medical History:  Diagnosis Date  . Arthritis    RA  . Colon polyps   . Depression   . Dyslipidemia   . Hypertension   . LBBB (left bundle branch block)    Which comes and goes.  Marland Kitchen NICM (nonischemic cardiomyopathy) ([place])    Echo 09/22/09, EF 45-50%  . Rotator cuff tear   . Shortness of breath    exertion    Past Surgical History:  Procedure Laterality Date  . BACK SURGERY    . CARDIAC CATHETERIZATION  09/22/09   Mild non-obstructive CAD  . CARDIOVASCULAR STRESS TEST  06/23/2008   Exercise:  normal perfusion. Low risk.  . COLONOSCOPY  10/12/2011   Procedure: COLONOSCOPY;  Surgeon: Rogene Houston, MD;  [facility];  Service: Endoscopy;  Laterality: N/A;  930  . COLONOSCOPY N/A 11/10/2016   Procedure: COLONOSCOPY;  Surgeon: Rogene Houston, MD;  [facility];  Service: Endoscopy;  Laterality: N/A;  930  . EYE SURGERY     CATARACTS ; RIGHT EYE Jun 19, 2018, LEFT EYE JUNE 9TH, 2020 , SAW HIS EYE DOCTOR AFTER THE JUNE 9TH PROCEDURE  .  LEFT FOOT SURGERY   2015  . POLYPECTOMY  11/10/2016   Procedure: POLYPECTOMY;  Surgeon: Rogene Houston, MD;  [facility];  Service: Endoscopy;;  colon  . TRANSTHORACIC ECHOCARDIOGRAM  09/22/09   EF 45-50%.  No significant valvular disease.     MEDICATIONS: . amLODipine (NORVASC) 10 MG tablet  . aspirin EC 81 MG tablet  . carvedilol (COREG) 6.25 MG tablet  . dorzolamide-timolol (COSOPT) 22.3-6.8 MG/ML ophthalmic solution  . folic acid (FOLVITE) 1 MG tablet  . latanoprost (XALATAN) 0.005 % ophthalmic solution  . lisinopril (PRINIVIL,ZESTRIL) 40 MG tablet  . methotrexate (RHEUMATREX) 2.5 MG tablet  . multivitamin (ONE-A-DAY MEN'S) TABS tablet  . Omega-3 350  MG CPDR   No current facility-administered medications for this encounter.    Maia Plan [facility] Pre-Surgical Testing [phone] 07/23/18 3:06 PM

## 2018-07-25 ENCOUNTER — Encounter (HOSPITAL_COMMUNITY)
Admission: RE | Disposition: A | Discharge status: Home / Self Care | Payer: Self-pay | Source: Home / Self Care | Attending: Orthopedic Surgery

## 2018-07-25 ENCOUNTER — Encounter (HOSPITAL_COMMUNITY): Payer: Self-pay | Admitting: Emergency Medicine

## 2018-07-25 ENCOUNTER — Ambulatory Visit (HOSPITAL_COMMUNITY): Payer: PPO | Admitting: Physician Assistant

## 2018-07-25 ENCOUNTER — Observation Stay (HOSPITAL_COMMUNITY)
Admission: RE | Admit: 2018-07-25 | Discharge: 2018-07-26 | Disposition: A | Discharge status: Home / Self Care | Payer: PPO | Attending: Orthopedic Surgery | Admitting: Orthopedic Surgery

## 2018-07-25 ENCOUNTER — Ambulatory Visit (HOSPITAL_COMMUNITY): Payer: PPO | Admitting: Certified Registered"

## 2018-07-25 ENCOUNTER — Other Ambulatory Visit: Payer: Self-pay

## 2018-07-25 DIAGNOSIS — S46012A Strain of muscle(s) and tendon(s) of the rotator cuff of left shoulder, initial encounter: Secondary | ICD-10-CM | POA: Diagnosis not present

## 2018-07-25 DIAGNOSIS — E785 Hyperlipidemia, unspecified: Secondary | ICD-10-CM | POA: Insufficient documentation

## 2018-07-25 DIAGNOSIS — I428 Other cardiomyopathies: Secondary | ICD-10-CM | POA: Insufficient documentation

## 2018-07-25 DIAGNOSIS — J8410 Pulmonary fibrosis, unspecified: Secondary | ICD-10-CM | POA: Insufficient documentation

## 2018-07-25 DIAGNOSIS — Z79899 Other long term (current) drug therapy: Secondary | ICD-10-CM | POA: Diagnosis not present

## 2018-07-25 DIAGNOSIS — M75110 Incomplete rotator cuff tear or rupture of unspecified shoulder, not specified as traumatic: Secondary | ICD-10-CM | POA: Diagnosis present

## 2018-07-25 DIAGNOSIS — I447 Left bundle-branch block, unspecified: Secondary | ICD-10-CM | POA: Insufficient documentation

## 2018-07-25 DIAGNOSIS — F329 Major depressive disorder, single episode, unspecified: Secondary | ICD-10-CM | POA: Diagnosis not present

## 2018-07-25 DIAGNOSIS — M75102 Unspecified rotator cuff tear or rupture of left shoulder, not specified as traumatic: Secondary | ICD-10-CM | POA: Insufficient documentation

## 2018-07-25 DIAGNOSIS — M25529 Pain in unspecified elbow: Secondary | ICD-10-CM | POA: Insufficient documentation

## 2018-07-25 DIAGNOSIS — M1990 Unspecified osteoarthritis, unspecified site: Secondary | ICD-10-CM | POA: Insufficient documentation

## 2018-07-25 DIAGNOSIS — Z1159 Encounter for screening for other viral diseases: Secondary | ICD-10-CM | POA: Diagnosis not present

## 2018-07-25 DIAGNOSIS — M069 Rheumatoid arthritis, unspecified: Secondary | ICD-10-CM | POA: Diagnosis not present

## 2018-07-25 DIAGNOSIS — M7542 Impingement syndrome of left shoulder: Secondary | ICD-10-CM | POA: Diagnosis not present

## 2018-07-25 DIAGNOSIS — G8918 Other acute postprocedural pain: Secondary | ICD-10-CM | POA: Diagnosis not present

## 2018-07-25 DIAGNOSIS — Z87891 Personal history of nicotine dependence: Secondary | ICD-10-CM | POA: Insufficient documentation

## 2018-07-25 DIAGNOSIS — M7502 Adhesive capsulitis of left shoulder: Secondary | ICD-10-CM | POA: Insufficient documentation

## 2018-07-25 DIAGNOSIS — I440 Atrioventricular block, first degree: Secondary | ICD-10-CM | POA: Diagnosis not present

## 2018-07-25 DIAGNOSIS — Z7982 Long term (current) use of aspirin: Secondary | ICD-10-CM | POA: Insufficient documentation

## 2018-07-25 DIAGNOSIS — I2510 Atherosclerotic heart disease of native coronary artery without angina pectoris: Secondary | ICD-10-CM | POA: Diagnosis not present

## 2018-07-25 DIAGNOSIS — I110 Hypertensive heart disease with heart failure: Secondary | ICD-10-CM | POA: Diagnosis not present

## 2018-07-25 DIAGNOSIS — Z86010 Personal history of colonic polyps: Secondary | ICD-10-CM | POA: Insufficient documentation

## 2018-07-25 HISTORY — PX: SHOULDER OPEN ROTATOR CUFF REPAIR: SHX2407

## 2018-07-25 LAB — TYPE AND SCREEN
ABO/RH(D): O POS
Antibody Screen: NEGATIVE

## 2018-07-25 LAB — SARS CORONAVIRUS 2 BY RT PCR (HOSPITAL ORDER, PERFORMED IN ~~LOC~~ HOSPITAL LAB): SARS Coronavirus 2: NEGATIVE

## 2018-07-25 SURGERY — REPAIR, ROTATOR CUFF, OPEN
Anesthesia: Regional | Site: Shoulder | Laterality: Left

## 2018-07-25 MED ORDER — FLEET ENEMA 7-19 GM/118ML RE ENEM: 1.0000 | ENEMA | Freq: Once | RECTAL | Status: DC | PRN

## 2018-07-25 MED ORDER — METOCLOPRAMIDE HCL 5 MG/ML IJ SOLN: 5.0000 mg | Freq: Three times a day (TID) | INTRAMUSCULAR | Status: DC | PRN

## 2018-07-25 MED ORDER — ONDANSETRON HCL 4 MG/2ML IJ SOLN: 4.0000 mg | Freq: Once | INTRAMUSCULAR | Status: DC | PRN

## 2018-07-25 MED ORDER — PROPOFOL 10 MG/ML IV BOLUS
INTRAVENOUS | Status: AC
  Filled 2018-07-25: qty 60

## 2018-07-25 MED ORDER — FENTANYL CITRATE (PF) 100 MCG/2ML IJ SOLN
INTRAMUSCULAR | Status: AC
  Filled 2018-07-25: qty 2

## 2018-07-25 MED ORDER — ROCURONIUM BROMIDE 10 MG/ML (PF) SYRINGE
PREFILLED_SYRINGE | INTRAVENOUS | Status: AC
  Filled 2018-07-25: qty 30

## 2018-07-25 MED ORDER — LISINOPRIL 20 MG PO TABS
40.0000 mg | ORAL_TABLET | Freq: Every day | ORAL | Status: DC
  Administered 2018-07-25: 40 mg via ORAL
  Filled 2018-07-25: qty 2

## 2018-07-25 MED ORDER — KETOROLAC TROMETHAMINE 30 MG/ML IJ SOLN: 15.0000 mg | Freq: Once | INTRAMUSCULAR | Status: DC

## 2018-07-25 MED ORDER — PHENOL 1.4 % MT LIQD: 1.0000 | OROMUCOSAL | Status: DC | PRN

## 2018-07-25 MED ORDER — EPHEDRINE SULFATE-NACL 50-0.9 MG/10ML-% IV SOSY
PREFILLED_SYRINGE | INTRAVENOUS | Status: DC | PRN
  Administered 2018-07-25: 5 mg via INTRAVENOUS

## 2018-07-25 MED ORDER — AMLODIPINE BESYLATE 10 MG PO TABS: 10.0000 mg | ORAL_TABLET | Freq: Every day | ORAL | Status: DC

## 2018-07-25 MED ORDER — BISACODYL 5 MG PO TBEC: 5.0000 mg | DELAYED_RELEASE_TABLET | Freq: Every day | ORAL | Status: DC | PRN

## 2018-07-25 MED ORDER — METHOTREXATE 2.5 MG PO TABS: 20.0000 mg | ORAL_TABLET | ORAL | Status: DC

## 2018-07-25 MED ORDER — SODIUM CHLORIDE 0.9 % IV SOLN
INTRAVENOUS | Status: AC
  Filled 2018-07-25: qty 500000

## 2018-07-25 MED ORDER — ROCURONIUM BROMIDE 10 MG/ML (PF) SYRINGE
PREFILLED_SYRINGE | INTRAVENOUS | Status: DC | PRN
  Administered 2018-07-25: 10 mg via INTRAVENOUS
  Administered 2018-07-25: 50 mg via INTRAVENOUS

## 2018-07-25 MED ORDER — POLYETHYLENE GLYCOL 3350 17 G PO PACK: 17.0000 g | PACK | Freq: Every day | ORAL | Status: DC | PRN

## 2018-07-25 MED ORDER — CEFAZOLIN SODIUM-DEXTROSE 1-4 GM/50ML-% IV SOLN
1.0000 g | Freq: Four times a day (QID) | INTRAVENOUS | Status: AC
  Administered 2018-07-25 – 2018-07-26 (×3): 1 g via INTRAVENOUS
  Filled 2018-07-25 (×3): qty 50

## 2018-07-25 MED ORDER — ACETAMINOPHEN 10 MG/ML IV SOLN: 1000.0000 mg | Freq: Once | INTRAVENOUS | Status: DC | PRN

## 2018-07-25 MED ORDER — GLYCOPYRROLATE PF 0.2 MG/ML IJ SOSY
PREFILLED_SYRINGE | INTRAMUSCULAR | Status: DC | PRN
  Administered 2018-07-25: .2 mg via INTRAVENOUS

## 2018-07-25 MED ORDER — BUPIVACAINE HCL (PF) 0.5 % IJ SOLN
INTRAMUSCULAR | Status: DC | PRN
  Administered 2018-07-25: 15 mL via PERINEURAL

## 2018-07-25 MED ORDER — SUGAMMADEX SODIUM 200 MG/2ML IV SOLN
INTRAVENOUS | Status: DC | PRN
  Administered 2018-07-25: 250 mg via INTRAVENOUS

## 2018-07-25 MED ORDER — SODIUM CHLORIDE 0.9 % IV SOLN
INTRAVENOUS | Status: DC | PRN
  Administered 2018-07-25: 40 ug/min via INTRAVENOUS

## 2018-07-25 MED ORDER — LACTATED RINGERS IV SOLN
INTRAVENOUS | Status: DC
  Administered 2018-07-25 (×2): via INTRAVENOUS

## 2018-07-25 MED ORDER — PHENYLEPHRINE HCL (PRESSORS) 10 MG/ML IV SOLN
INTRAVENOUS | Status: AC
  Filled 2018-07-25: qty 1

## 2018-07-25 MED ORDER — ESMOLOL HCL 100 MG/10ML IV SOLN
INTRAVENOUS | Status: AC
  Filled 2018-07-25: qty 10

## 2018-07-25 MED ORDER — ONDANSETRON HCL 4 MG PO TABS: 4.0000 mg | ORAL_TABLET | Freq: Four times a day (QID) | ORAL | Status: DC | PRN

## 2018-07-25 MED ORDER — DEXAMETHASONE SODIUM PHOSPHATE 10 MG/ML IJ SOLN
INTRAMUSCULAR | Status: DC | PRN
  Administered 2018-07-25: 8 mg via INTRAVENOUS

## 2018-07-25 MED ORDER — CARVEDILOL 6.25 MG PO TABS
6.2500 mg | ORAL_TABLET | Freq: Two times a day (BID) | ORAL | Status: DC
  Administered 2018-07-25: 22:00:00 6.25 mg via ORAL
  Filled 2018-07-25: qty 1

## 2018-07-25 MED ORDER — ONDANSETRON HCL 4 MG/2ML IJ SOLN
INTRAMUSCULAR | Status: DC | PRN
  Administered 2018-07-25: 4 mg via INTRAVENOUS

## 2018-07-25 MED ORDER — LACTATED RINGERS IV SOLN
INTRAVENOUS | Status: DC
  Administered 2018-07-25: 22:00:00 via INTRAVENOUS

## 2018-07-25 MED ORDER — LATANOPROST 0.005 % OP SOLN
1.0000 [drp] | Freq: Every day | OPHTHALMIC | Status: DC
  Filled 2018-07-25: qty 2.5

## 2018-07-25 MED ORDER — PROPOFOL 10 MG/ML IV BOLUS
INTRAVENOUS | Status: DC | PRN
  Administered 2018-07-25 (×2): 30 mg via INTRAVENOUS
  Administered 2018-07-25: 140 mg via INTRAVENOUS

## 2018-07-25 MED ORDER — METOCLOPRAMIDE HCL 5 MG PO TABS: 5.0000 mg | ORAL_TABLET | Freq: Three times a day (TID) | ORAL | Status: DC | PRN

## 2018-07-25 MED ORDER — BUPIVACAINE LIPOSOME 1.3 % IJ SUSP
INTRAMUSCULAR | Status: DC | PRN
  Administered 2018-07-25: 10 mL via PERINEURAL

## 2018-07-25 MED ORDER — CHLORHEXIDINE GLUCONATE 4 % EX LIQD: 60.0000 mL | Freq: Once | CUTANEOUS | Status: DC

## 2018-07-25 MED ORDER — CEFAZOLIN SODIUM-DEXTROSE 2-4 GM/100ML-% IV SOLN
2.0000 g | INTRAVENOUS | Status: AC
  Administered 2018-07-25: 11:00:00 2 g via INTRAVENOUS
  Filled 2018-07-25: qty 100

## 2018-07-25 MED ORDER — SUCCINYLCHOLINE CHLORIDE 200 MG/10ML IV SOSY
PREFILLED_SYRINGE | INTRAVENOUS | Status: DC | PRN
  Administered 2018-07-25: 80 mg via INTRAVENOUS

## 2018-07-25 MED ORDER — DOCUSATE SODIUM 100 MG PO CAPS
100.0000 mg | ORAL_CAPSULE | Freq: Two times a day (BID) | ORAL | Status: DC
  Administered 2018-07-25: 100 mg via ORAL
  Filled 2018-07-25: qty 1

## 2018-07-25 MED ORDER — FENTANYL CITRATE (PF) 100 MCG/2ML IJ SOLN
50.0000 ug | INTRAMUSCULAR | Status: DC
  Administered 2018-07-25: 09:00:00 50 ug via INTRAVENOUS
  Filled 2018-07-25: qty 2

## 2018-07-25 MED ORDER — SODIUM CHLORIDE 0.9 % IV SOLN
INTRAVENOUS | Status: DC | PRN
  Administered 2018-07-25: 500 mL

## 2018-07-25 MED ORDER — FENTANYL CITRATE (PF) 100 MCG/2ML IJ SOLN: 25.0000 ug | INTRAMUSCULAR | Status: DC | PRN

## 2018-07-25 MED ORDER — MENTHOL 3 MG MT LOZG: 1.0000 | LOZENGE | OROMUCOSAL | Status: DC | PRN

## 2018-07-25 MED ORDER — DORZOLAMIDE HCL-TIMOLOL MAL 2-0.5 % OP SOLN
1.0000 [drp] | Freq: Two times a day (BID) | OPHTHALMIC | Status: DC
  Filled 2018-07-25: qty 10

## 2018-07-25 MED ORDER — MIDAZOLAM HCL 2 MG/2ML IJ SOLN
1.0000 mg | INTRAMUSCULAR | Status: DC
  Administered 2018-07-25: 1 mg via INTRAVENOUS
  Filled 2018-07-25: qty 2

## 2018-07-25 MED ORDER — FENTANYL CITRATE (PF) 100 MCG/2ML IJ SOLN
INTRAMUSCULAR | Status: DC | PRN
  Administered 2018-07-25 (×4): 25 ug via INTRAVENOUS

## 2018-07-25 MED ORDER — ONDANSETRON HCL 4 MG/2ML IJ SOLN: 4.0000 mg | Freq: Four times a day (QID) | INTRAMUSCULAR | Status: DC | PRN

## 2018-07-25 MED ORDER — LIDOCAINE 2% (20 MG/ML) 5 ML SYRINGE
INTRAMUSCULAR | Status: DC | PRN
  Administered 2018-07-25: 40 mg via INTRAVENOUS

## 2018-07-25 SURGICAL SUPPLY — 48 items
AGENT HMST SPONGE THK3/8 (HEMOSTASIS)
AID PSTN UNV HD RSTRNT DISP (MISCELLANEOUS) ×1
ATTRACTOMAT 16X20 MAGNETIC DRP (DRAPES) ×3 IMPLANT
BAG SPEC THK2 15X12 ZIP CLS (MISCELLANEOUS)
BAG ZIPLOCK 12X15 (MISCELLANEOUS) IMPLANT
BLADE OSCILLATING/SAGITTAL (BLADE) ×3
BLADE SW THK.38XMED LNG THN (BLADE) ×1 IMPLANT
BUR OVAL CARBIDE 4.0 (BURR) ×3 IMPLANT
CLOSURE WOUND 1/2 X4 (GAUZE/BANDAGES/DRESSINGS) ×1
COVER SURGICAL LIGHT HANDLE (MISCELLANEOUS) ×3 IMPLANT
COVER WAND RF STERILE (DRAPES) IMPLANT
DERMASPAN .5-.9MM 4X4CM SHOU (Miscellaneous) ×2 IMPLANT
DRAPE POUCH INSTRU U-SHP 10X18 (DRAPES) ×3 IMPLANT
DRSG AQUACEL AG ADV 3.5X 6 (GAUZE/BANDAGES/DRESSINGS) ×3 IMPLANT
DURAPREP 26ML APPLICATOR (WOUND CARE) ×3 IMPLANT
ELECT REM PT RETURN 15FT ADLT (MISCELLANEOUS) ×3 IMPLANT
GLOVE BIOGEL PI IND STRL 6.5 (GLOVE) ×1 IMPLANT
GLOVE BIOGEL PI IND STRL 8.5 (GLOVE) ×1 IMPLANT
GLOVE BIOGEL PI INDICATOR 6.5 (GLOVE) ×2
GLOVE BIOGEL PI INDICATOR 8.5 (GLOVE) ×2
GLOVE ECLIPSE 8.0 STRL XLNG CF (GLOVE) ×3 IMPLANT
GLOVE SURG SS PI 6.5 STRL IVOR (GLOVE) ×3 IMPLANT
GOWN STRL REUS W/TWL LRG LVL3 (GOWN DISPOSABLE) ×3 IMPLANT
GOWN STRL REUS W/TWL XL LVL3 (GOWN DISPOSABLE) ×3 IMPLANT
HEMOSTAT SPONGE AVITENE ULTRA (HEMOSTASIS) ×1 IMPLANT
KIT BASIN OR (CUSTOM PROCEDURE TRAY) ×3 IMPLANT
KIT TURNOVER KIT A (KITS) IMPLANT
MANIFOLD NEPTUNE II (INSTRUMENTS) ×3 IMPLANT
NDL MA TROC 1/2 (NEEDLE) IMPLANT
NEEDLE MA TROC 1/2 (NEEDLE) IMPLANT
NS IRRIG 1000ML POUR BTL (IV SOLUTION) ×2 IMPLANT
PACK SHOULDER (CUSTOM PROCEDURE TRAY) ×3 IMPLANT
RESTRAINT HEAD UNIVERSAL NS (MISCELLANEOUS) ×2 IMPLANT
SLING ARM FOAM STRAP LRG (SOFTGOODS) ×2 IMPLANT
SPONGE LAP 4X18 RFD (DISPOSABLE) ×4 IMPLANT
STAPLER VISISTAT 35W (STAPLE) IMPLANT
STRIP CLOSURE SKIN 1/2X4 (GAUZE/BANDAGES/DRESSINGS) ×2 IMPLANT
SUCTION FRAZIER HANDLE 12FR (TUBING) ×2
SUCTION TUBE FRAZIER 12FR DISP (TUBING) ×1 IMPLANT
SUT BONE WAX W31G (SUTURE) ×3 IMPLANT
SUT ETHIBOND NAB CT1 #1 30IN (SUTURE) ×5 IMPLANT
SUT MNCRL AB 4-0 PS2 18 (SUTURE) ×3 IMPLANT
SUT VIC AB 1 CT1 27 (SUTURE) ×6
SUT VIC AB 1 CT1 27XBRD ANTBC (SUTURE) ×1 IMPLANT
SUT VIC AB 2-0 CT1 27 (SUTURE) ×6
SUT VIC AB 2-0 CT1 TAPERPNT 27 (SUTURE) ×1 IMPLANT
TOWEL OR 17X26 10 PK STRL BLUE (TOWEL DISPOSABLE) ×3 IMPLANT
TOWEL OR NON WOVEN STRL DISP B (DISPOSABLE) ×3 IMPLANT

## 2018-07-25 NOTE — Discharge Instructions (Signed)
Keep your sling on at all times, including sleeping in your sling. You may shower with the dressing on. It is waterproof.  It the dressing becomes compromised, remove and use gauze and paper tape dressing.  No lifting or over head movements with the left arm. No driving. Call Dr. Gladstone Lighter if any wound complications or temperature of 101 degrees F or over.  Call the office for an appointment to see Dr. Gladstone Lighter in one week: [phone]

## 2018-07-25 NOTE — Transfer of Care (Signed)
Immediate Anesthesia Transfer of Care Note  Patient: Matthew Weiss.  Procedure(s) Performed: Left shoulder rotator cuff repair with graft (Left Shoulder)  Patient Location: PACU  Anesthesia Type:GA combined with regional for post-op pain  Level of Consciousness: drowsy, patient cooperative and responds to stimulation  Airway & Oxygen Therapy: Patient Spontanous Breathing and Patient connected to face mask oxygen  Post-op Assessment: Report given to RN and Post -op Vital signs reviewed and stable  Post vital signs: Reviewed and stable  Last Vitals:  Vitals Value Taken Time  BP 110/70 07/25/18 1147  Temp    Pulse 67 07/25/18 1149  Resp 29 07/25/18 1149  SpO2 100 % 07/25/18 1149  Vitals shown include unvalidated device data.  Last Pain:  Vitals:   07/25/18 0802  TempSrc:   PainSc: 0-No pain      Patients Stated Pain Goal: 4 (02/63/78 5885)  Complications: No apparent anesthesia complications

## 2018-07-25 NOTE — Progress Notes (Addendum)
AssistedDr. Ellender with left, ultrasound guided, interscalene  block. Side rails up, monitors on throughout procedure. See vital signs in flow sheet. Tolerated Procedure well.  

## 2018-07-25 NOTE — Brief Op Note (Signed)
07/25/2018  11:23 AM  PATIENT:  Matthew Weiss.  78 y.o. male  PRE-OPERATIVE DIAGNOSIS:  Left shoulder rotator cuff tear with sever Impingement.  POST-OPERATIVE DIAGNOSIS:  Same as Pre-Op  PROCEDURE:  Procedure(s) with comments: Left shoulder rotator cuff repair with graft (Left) - 42min There are no matching recommendations for this patient.  Acromionectomy.  SURGEON:  Surgeon(s) and Role:    * Latanya Maudlin, MD - Primary  PHYSICIAN ASSISTANT: Ardeen Jourdain PA  ASSISTANTS:Amber Mulhall PA  ANESTHESIA:   general with Interscalene Nerve Block.  EBL:  15cc  BLOOD ADMINISTERED:none  DRAINS: none   LOCAL MEDICATIONS USED:  NONE  SPECIMEN:  No Specimen  DISPOSITION OF SPECIMEN:  N/A  COUNTS:  YES  TOURNIQUET:  * No tourniquets in log *  DICTATION: .Other Dictation: Dictation Number [id number]  PLAN OF CARE: Admit for overnight observation  PATIENT DISPOSITION:  Stable in OR   Delay start of Pharmacological VTE agent (>24hrs) due to surgical blood loss or risk of bleeding: yes

## 2018-07-25 NOTE — H&P (Signed)
Matthew Weiss. is an 78 y.o. male.   Chief Complaint: Pain in his Left Shoulder HPI: Progressive pain and weakness in his Left Shoulder.  Past Medical History:  Diagnosis Date  . Arthritis    RA  . Colon polyps   . Depression   . Dyslipidemia   . Hypertension   . LBBB (left bundle branch block)    Which comes and goes.  Marland Kitchen NICM (nonischemic cardiomyopathy) ([place])    Echo 09/22/09, EF 45-50%  . Rotator cuff tear   . Shortness of breath    exertion    Past Surgical History:  Procedure Laterality Date  . BACK SURGERY    . CARDIAC CATHETERIZATION  09/22/09   Mild non-obstructive CAD  . CARDIOVASCULAR STRESS TEST  06/23/2008   Exercise:  normal perfusion. Low risk.  . COLONOSCOPY  10/12/2011   Procedure: COLONOSCOPY;  Surgeon: Rogene Houston, MD;  [facility];  Service: Endoscopy;  Laterality: N/A;  930  . COLONOSCOPY N/A 11/10/2016   Procedure: COLONOSCOPY;  Surgeon: Rogene Houston, MD;  [facility];  Service: Endoscopy;  Laterality: N/A;  930  . EYE SURGERY     CATARACTS ; RIGHT EYE Jun 19, 2018, LEFT EYE JUNE 9TH, 2020 , SAW HIS EYE DOCTOR AFTER THE JUNE 9TH PROCEDURE  . LEFT FOOT SURGERY   2015  . POLYPECTOMY  11/10/2016   Procedure: POLYPECTOMY;  Surgeon: Rogene Houston, MD;  [facility];  Service: Endoscopy;;  colon  . TRANSTHORACIC ECHOCARDIOGRAM  09/22/09   EF 45-50%.  No significant valvular disease.     Family History  Problem Relation Age of Onset  . Colon cancer Neg Hx    Social History:  reports that he has quit smoking. He quit after 15.00 years of use. He has never used smokeless tobacco. He reports that he does not drink alcohol or use drugs.  Allergies: No Known Allergies  Medications Prior to Admission  Medication Sig Dispense Refill  . amLODipine (NORVASC) 10 MG tablet Take 10 mg by mouth daily.     Marland Kitchen aspirin EC 81 MG tablet Take 1 tablet (81 mg total) by mouth daily.    . carvedilol (COREG) 6.25 MG tablet Take 1  tablet (6.25 mg total) by mouth 2 (two) times daily. 60 tablet 11  . dorzolamide-timolol (COSOPT) 22.3-6.8 MG/ML ophthalmic solution Place 1 drop into both eyes 2 (two) times daily.    . folic acid (FOLVITE) 1 MG tablet Take 1 mg by mouth daily.    Marland Kitchen latanoprost (XALATAN) 0.005 % ophthalmic solution Place 1 drop into both eyes at bedtime.     Marland Kitchen lisinopril (PRINIVIL,ZESTRIL) 40 MG tablet Take 40 mg by mouth daily.    . methotrexate (RHEUMATREX) 2.5 MG tablet Take 20 mg by mouth once a week.     . multivitamin (ONE-A-DAY MEN'S) TABS tablet Take 1 tablet by mouth daily.    . Omega-3 350 MG CPDR Take 350 mg by mouth daily.      Results for orders placed or performed during the hospital encounter of 07/25/18 (from the past 48 hour(s))  SARS Coronavirus 2 (CEPHEID - Performed in [facility] hospital lab), Hosp Order     Status: None   Collection Time: 07/25/18  7:45 AM   Specimen: Nasopharyngeal Swab  Result Value Ref Range   SARS Coronavirus 2 NEGATIVE NEGATIVE    Comment: (NOTE) If result is NEGATIVE SARS-CoV-2 target nucleic acids are NOT DETECTED. The  SARS-CoV-2 RNA is generally detectable in upper and lower  respiratory specimens during the acute phase of infection. The lowest  concentration of SARS-CoV-2 viral copies this assay can detect is 250  copies / mL. A negative result does not preclude SARS-CoV-2 infection  and should not be used as the sole basis for treatment or other  patient management decisions.  A negative result may occur with  improper specimen collection / handling, submission of specimen other  than nasopharyngeal swab, presence of viral mutation(s) within the  areas targeted by this assay, and inadequate number of viral copies  (<250 copies / mL). A negative result must be combined with clinical  observations, patient history, and epidemiological information. If result is POSITIVE SARS-CoV-2 target nucleic acids are DETECTED. The SARS-CoV-2 RNA is generally  detectable in upper and lower  respiratory specimens dur ing the acute phase of infection.  Positive  results are indicative of active infection with SARS-CoV-2.  Clinical  correlation with patient history and other diagnostic information is  necessary to determine patient infection status.  Positive results do  not rule out bacterial infection or co-infection with other viruses. If result is PRESUMPTIVE POSTIVE SARS-CoV-2 nucleic acids MAY BE PRESENT.   A presumptive positive result was obtained on the submitted specimen  and confirmed on repeat testing.  While 2019 novel coronavirus  (SARS-CoV-2) nucleic acids may be present in the submitted sample  additional confirmatory testing may be necessary for epidemiological  and / or clinical management purposes  to differentiate between  SARS-CoV-2 and other Sarbecovirus currently known to infect humans.  If clinically indicated additional testing with an alternate test  methodology [id number]) is advised. The SARS-CoV-2 RNA is generally  detectable in upper and lower respiratory sp ecimens during the acute  phase of infection. The expected result is Negative. Fact Sheet for Patients:  StrictlyIdeas.no Fact Sheet for Healthcare Providers: BankingDealers.co.za This test is not yet approved or cleared by the [place] FDA and has been authorized for detection and/or diagnosis of SARS-CoV-2 by FDA under an Emergency Use Authorization (EUA).  This EUA will remain in effect (meaning this test can be used) for the duration of the COVID-19 declaration under Section 564(b)(1) of the Act, 21 U.S.C. section 360bbb-3(b)(1), unless the authorization is terminated or revoked sooner. Performed at [facility], [place] [address] [place], [place] [address]    No results found.  Review of Systems  Constitutional: Negative.   HENT: Negative.   Eyes: Negative.   Respiratory: Negative.    Cardiovascular: Negative.   Gastrointestinal: Negative.   Genitourinary: Negative.   Musculoskeletal: Positive for joint pain.  Skin: Negative.   Neurological: Negative.   Endo/Heme/Allergies: Negative.   Psychiatric/Behavioral: Negative.     Blood pressure 134/73, pulse (!) 58, temperature 97.9 F (36.6 C), temperature source Oral, resp. rate (!) 23, height 5\' 9"  (1.753 m), weight 71.2 kg, SpO2 100 %. Physical Exam  Constitutional: He appears well-developed.  HENT:  Head: Normocephalic.  Eyes: Pupils are equal, round, and reactive to light.  Neck: Normal range of motion.  Cardiovascular: Normal rate.  Respiratory: Effort normal.  GI: Soft.  Musculoskeletal:     Comments: Extreme Weakness of his Left Shoulder Abduction.  Neurological: He is alert.  Skin: Skin is warm.  Psychiatric: He has a normal mood and affect.     Assessment/Plan Open Acromionectomy and repair of his Left Rotator Cuff with oissible anchors and Graft as discussed Pre-Op  Latanya Maudlin, MD 07/25/2018, 9:44  AM

## 2018-07-25 NOTE — Anesthesia Procedure Notes (Addendum)
Anesthesia Regional Block: Interscalene brachial plexus block   Pre-Anesthetic Checklist: ,, timeout performed, Correct Patient, Correct Site, Correct Laterality, Correct Procedure, Correct Position, site marked, Risks and benefits discussed,  Surgical consent,  Pre-op evaluation,  At surgeon's request and post-op pain management  Laterality: Left  Prep: chloraprep       Needles:  Injection technique: Single-shot  Needle Type: Echogenic Stimulator Needle     Needle Length: 9cm  Needle Gauge: 21     Additional Needles:   Procedures:,,,, ultrasound used (permanent image in chart),,,,  Narrative:  Start time: 07/25/2018 9:15 AM End time: 07/25/2018 9:25 AM Injection made incrementally with aspirations every 5 mL.  Performed by: Personally  Anesthesiologist: Murvin Natal, MD  Additional Notes: Functioning IV was confirmed and monitors were applied.  A timeout was performed. Sterile prep, hand hygiene and sterile gloves were used. A 22mm 21ga Arrow echogenic stimulator needle was used. Negative aspiration and negative test dose prior to incremental administration of local anesthetic. The patient tolerated the procedure well.  Ultrasound guidance: relevent anatomy identified, needle position confirmed, local anesthetic spread visualized around nerve(s), vascular puncture avoided.  Image printed for medical record.

## 2018-07-25 NOTE — Anesthesia Postprocedure Evaluation (Signed)
Anesthesia Post Note  Patient: Matthew Weiss.  Procedure(s) Performed: Left shoulder rotator cuff repair with graft (Left Shoulder)     Patient location during evaluation: PACU Anesthesia Type: Regional and General Level of consciousness: awake and alert, patient cooperative and oriented Pain management: pain level controlled Vital Signs Assessment: post-procedure vital signs reviewed and stable Respiratory status: spontaneous breathing, nonlabored ventilation and respiratory function stable Cardiovascular status: blood pressure returned to baseline and stable Postop Assessment: no apparent nausea or vomiting Anesthetic complications: no    Last Vitals:  Vitals:   07/25/18 1300 07/25/18 1315  BP: 123/78 124/73  Pulse: 63 63  Resp: 20 20  Temp: (!) 36.2 C   SpO2: 98% 98%    Last Pain:  Vitals:   07/25/18 1245  TempSrc:   PainSc: 0-No pain                 Keerthi Hazell,E. Spenser Harren

## 2018-07-25 NOTE — Op Note (Signed)
NAME: Matthew JR., Matthew Weiss:[id number] ACCOUNT [id number] DATE OF BIRTH:December 29, 1940 FACILITY[facility] LOCATION: [facility] PHYSICIAN:Dreyden Rohrman Fransico Setters, MD  OPERATIVE REPORT  DATE OF PROCEDURE:  07/25/2018  SURGEON:  Latanya Maudlin, MD  ASSISTANT:  Ardeen Jourdain, PA  PREOPERATIVE DIAGNOSES: 1.  Severe impingement syndrome, left shoulder. 2.  Tear of the rotator cuff tendon, left shoulder. 3.  Severe adhesive capsulitis, left shoulder.  POSTOPERATIVE DIAGNOSES: 1.  Severe impingement syndrome, left shoulder. 2.  Tear of the rotator cuff tendon, left shoulder. 3.  Severe adhesive capsulitis, left shoulder.  OPERATION: 1.  Closed manipulation, left shoulder. 2.  Open acromionectomy, left shoulder. 3.  Open repair of a tear of the rotator cuff tendon, left shoulder, with application of a DermaSpan graft.  DESCRIPTION OF PROCEDURE:  Under general anesthesia, routine orthopedic prep and draping of the left upper extremity was carried out.  The patient was placed in the beach-chair position.  The appropriate time-out was carried out.  I also marked the  appropriate left shoulder in the holding area.  He had an interscalene nerve block preop and 2 g of IV Ancef.  At this time, an incision was made over the anterior aspect of the left shoulder.  Bleeders were identified and cauterized.  At this time, we  went down and separated the deltoid tendon by sharp dissection both medially and laterally from the acromion.  I then split a small proximal part of the deltoid muscle.  At this particular point, we did a subacromial bursectomy.  Prior to doing this, I  did do a closed manipulation of the shoulder.  Once the shoulder was opened, I then utilized a gloved finger to complete breaking up the adhesions.  He had severe impingement.  He had a large defect in the cuff.  I then protected the cup with the Bennett  retractor, utilized the oscillating saw and a bur to do an open  acromionectomy and acromioplasty.  Once this was done, we reestablished the subacromial space.  I thoroughly irrigated out the area of bone wax, the undersurface of the acromion.  At this  time, the repair then was carried out.  We then utilized the graft, that is a DermaSpan graft, for application over the tendon, which was quite thinned out from the severe chronic impingement.  Once this was done, we then reinspected the subacromial  space.  We then reapproximated the deltoid tendon muscle in the usual fashion.  The subcu was closed with a running locking Monocryl suture.  Sterile dressings were applied.  The patient was placed in a sling.  He will be kept overnight for observation  for pain.  LN/NUANCE  D:07/25/2018 T:07/25/2018 JOB:[id number]

## 2018-07-25 NOTE — Anesthesia Procedure Notes (Signed)
Procedure Name: Intubation Date/Time: 07/25/2018 10:31 AM Performed by: Silas Sacramento, CRNA Pre-anesthesia Checklist: Patient identified, Emergency Drugs available, Suction available and Patient being monitored Patient Re-evaluated:Patient Re-evaluated prior to induction Oxygen Delivery Method: Circle system utilized Preoxygenation: Pre-oxygenation with 100% oxygen Induction Type: IV induction Laryngoscope Size: Mac and 4 Grade View: Grade I Tube type: Oral Tube size: 7.5 mm Number of attempts: 1 Airway Equipment and Method: Stylet and Oral airway Placement Confirmation: ETT inserted through vocal cords under direct vision,  positive ETCO2 and breath sounds checked- equal and bilateral Secured at: 24 cm Tube secured with: Tape Dental Injury: Teeth and Oropharynx as per pre-operative assessment

## 2018-07-25 NOTE — Interval H&P Note (Signed)
History and Physical Interval Note:  07/25/2018 9:49 AM  Matthew Weiss.  has presented today for surgery, with the diagnosis of Left shoulder rotator cuff tear with retraction.  The various methods of treatment have been discussed with the patient and family. After consideration of risks, benefits and other options for treatment, the patient has consented to  Procedure(s) with comments: Left shoulder rotator cuff repair with graft and anchors (Left) - 8min as a surgical intervention.  The patient's history has been reviewed, patient examined, no change in status, stable for surgery.  I have reviewed the patient's chart and labs.  Questions were answered to the patient's satisfaction.     Latanya Maudlin

## 2018-07-26 ENCOUNTER — Encounter (HOSPITAL_COMMUNITY): Payer: Self-pay | Admitting: Orthopedic Surgery

## 2018-07-26 DIAGNOSIS — M7542 Impingement syndrome of left shoulder: Secondary | ICD-10-CM | POA: Diagnosis not present

## 2018-07-26 MED ORDER — DEXTROSE 50 % IV SOLN
INTRAVENOUS | Status: AC
  Filled 2018-07-26: qty 50

## 2018-07-26 NOTE — Progress Notes (Signed)
Patient was given discharge instructions with no immediate questions or concerns. Patient will be taken downstairs via wheelchair for discharge. 

## 2018-07-26 NOTE — Discharge Summary (Signed)
Physician Discharge Summary   Patient ID: Matthew Weiss. MRN: [id number] DOB/AGE: Sep 08, 1940 78 y.o.  Admit date: 07/25/2018 Discharge date: 07/26/2018  Primary Diagnosis:   Left shoulder rotator cuff tear  Admission Diagnoses:  Past Medical History:  Diagnosis Date  . Arthritis    RA  . Colon polyps   . Depression   . Dyslipidemia   . Hypertension   . LBBB (left bundle branch block)    Which comes and goes.  Marland Kitchen NICM (nonischemic cardiomyopathy) ([place])    Echo 09/22/09, EF 45-50%  . Rotator cuff tear   . Shortness of breath    exertion   Discharge Diagnoses:   Active Problems:   Partial nontraumatic tear of rotator cuff  Procedure:  Procedure(s) (LRB): Left shoulder rotator cuff repair with graft (Left)   Consults: None  HPI:  Matthew Weiss presents with the chief complaint of left shoulder pain. His shoulder has been bothering him for over 1 year now. He saw Dr. Grayland Ormond for the shoulder about 9 months ago. He had xray done back in May of last year. He was given a cortisone injection. The injection did not help him at all. The pain does radiate down his arm to his elbow. He does have some catching. He has noticed decreased motion and strength. He denies neck pain. He denies numbness and tingling in the arms. He is left handed. MRI showed a rotator cuff repair    Laboratory Data: Hospital Outpatient Visit on 07/21/2018  Component Date Value Ref Range Status  . SARS Coronavirus 2 07/21/2018 INVALID, UNABLE TO DETERMINE THE PRESENCE OF TARGET DUE TO SPECIMEN INTEGRITY. RECOLLECTION REQUESTED.* NEGATIVE Final   Comment: (NOTE) Presence or absence of SARS-CoV-2 nucleic acids cannot be determined. Repeat testing was performed on the submitted specimen and repeated Invalid results were obtained. If clinically indicated, additional testing on a new specimen with an alternate test methodology [phone]) is advised. The SARS-CoV-2 RNA is generally detectable in upper and lower  respiratory specimens during the acute phase of infection. The expected result is Negative. Fact Sheet for Patients: TrashEliminator.se Fact Sheet for Healthcare Providers: WhoisBlogging.ch This test is not yet approved or cleared by the [place] FDA and  has been authorized for detection and/or diagnosis of SARS-CoV-2 by FDA under an Emergency Use Authorization (EUA). This EUA will remain  in effect (meaning this test can be used) for the duration of the COVID-19 declaration under Section 564(b)(1) of the Act, 21 U.S.C. section 360bbb-3(b)(1), unless the authorizat                          ion is terminated or revoked sooner. Performed at [facility], [place] [address], [place], [place] [address]      Hospital Course: Patient was admitted to [facility] and taken to the OR and underwent the above state procedure without complications.  Patient tolerated the procedure well and was later transferred to the recovery room and then to the orthopaedic floor for postoperative care.  They were given PO and IV analgesics for pain control following their surgery.  They were given 24 hours of postoperative antibiotics. OT was consulted.  Discharge planning was consulted to help with postop disposition and equipment needs.  Patient had a good night on the evening of surgery.Patient was seen in rounds and was ready to go home on day one.  They were given discharge instructions and dressing directions.  They were instructed on  when to follow up in the office with Dr. Gladstone Lighter.   Diet: Cardiac diet Activity:Wear sling at all times Follow-up:in 1 week Disposition - Home Discharged Condition: stable   Discharge Instructions    Call MD / Call 911   Complete by: As directed    If you experience chest pain or shortness of breath, CALL 911 and be transported to the hospital emergency room.  If you develope a fever above 101 F, pus (white  drainage) or increased drainage or redness at the wound, or calf pain, call your surgeon's office.   Constipation Prevention   Complete by: As directed    Drink plenty of fluids.  Prune juice may be helpful.  You may use a stool softener, such as Colace (over the counter) 100 mg twice a day.  Use MiraLax (over the counter) for constipation as needed.   Diet - low sodium heart healthy   Complete by: As directed    Discharge instructions   Complete by: As directed    Keep your sling on at all times, including sleeping in your sling. You may shower with the dressing on. It is waterproof.  It the dressing becomes compromised, remove and use gauze and paper tape dressing.  No lifting or over head movements with the left arm. No driving. Call Dr. Gladstone Lighter if any wound complications or temperature of 101 degrees F or over.  Call the office for an appointment to see Dr. Gladstone Lighter in one week: [phone]   Increase activity slowly as tolerated   Complete by: As directed      Allergies as of 07/26/2018   No Known Allergies     Medication List    TAKE these medications   amLODipine 10 MG tablet Commonly known as: NORVASC Take 10 mg by mouth daily.   aspirin EC 81 MG tablet Take 1 tablet (81 mg total) by mouth daily.   carvedilol 6.25 MG tablet Commonly known as: COREG Take 1 tablet (6.25 mg total) by mouth 2 (two) times daily.   dorzolamide-timolol 22.3-6.8 MG/ML ophthalmic solution Commonly known as: COSOPT Place 1 drop into both eyes 2 (two) times daily.   folic acid 1 MG tablet Commonly known as: FOLVITE Take 1 mg by mouth daily.   latanoprost 0.005 % ophthalmic solution Commonly known as: XALATAN Place 1 drop into both eyes at bedtime.   lisinopril 40 MG tablet Commonly known as: ZESTRIL Take 40 mg by mouth daily.   methotrexate 2.5 MG tablet Commonly known as: RHEUMATREX Take 20 mg by mouth once a week. Notes to patient: ONCE A WEEK AS PRESCRIBED   multivitamin Tabs  tablet Take 1 tablet by mouth daily.   Omega-3 350 MG Cpdr Take 350 mg by mouth daily.      Follow-up Information    Latanya Maudlin, MD. Schedule an appointment as soon as possible for a visit on 08/02/2018.   Specialty: Orthopedic Surgery Contact information: [address] [place] [address] [phone]           Signed: Ardeen Jourdain, PA-C Orthopaedic Surgery 07/26/2018, 11:15 AM

## 2018-07-26 NOTE — Progress Notes (Signed)
   Subjective: 1 Day Post-Op Procedure(s) (LRB): Left shoulder rotator cuff repair with graft (Left) Patient reports pain as mild.   Patient seen in rounds for Dr. Gladstone Lighter. Patient is well, and has had no acute complaints or problems. Reports minimal discomfort in the left shoulder. Denies numbness and tingling in the UE. Denies SOB and chest pain. Plan is to go Home after hospital stay.  Objective: Vital signs in last 24 hours: Temp:  [96.6 F (35.9 C)-98.6 F (37 C)] 98.4 F (36.9 C) (06/25 0601) Pulse Rate:  [58-92] 78 (06/25 0601) Resp:  [16-24] 18 (06/25 0601) BP: (110-146)/(69-89) 134/76 (06/25 0601) SpO2:  [98 %-100 %] 100 % (06/25 0601) Weight:  [71.2 kg] 71.2 kg (06/24 0802)  Intake/Output from previous day:  Intake/Output Summary (Last 24 hours) at 07/26/2018 0753 Last data filed at 07/26/2018 4801 Gross per 24 hour  Intake 3237.49 ml  Output 2625 ml  Net 612.49 ml     EXAM General - Patient is Alert and Oriented Extremity - Neurologically intact Sensation intact distally Intact pulses distally No cellulitis present Dressing/Incision - clean, dry Motor Function - intact, moving hand and fingers well on exam.   Past Medical History:  Diagnosis Date  . Arthritis    RA  . Colon polyps   . Depression   . Dyslipidemia   . Hypertension   . LBBB (left bundle branch block)    Which comes and goes.  Marland Kitchen NICM (nonischemic cardiomyopathy) ([place])    Echo 09/22/09, EF 45-50%  . Rotator cuff tear   . Shortness of breath    exertion    Assessment/Plan: 1 Day Post-Op Procedure(s) (LRB): Left shoulder rotator cuff repair with graft (Left) Active Problems:   Partial nontraumatic tear of rotator cuff  Estimated body mass index is 23.18 kg/m as calculated from the following:   Height as of this encounter: 5\' 9"  (1.753 m).   Weight as of this encounter: 71.2 kg. Advance diet D/C IV fluids when tolerating POs well  DVT Prophylaxis - Aspirin Must wear sling at all  times except when showering  Plan for DC home today. Discharge instructions discussed. May work with OT before DC. Follow up in office in 1 week.   Ardeen Jourdain, PA-C Orthopaedic Surgery 07/26/2018, 7:53 AM

## 2018-07-26 NOTE — Evaluation (Signed)
Occupational Therapy Evaluation Patient Details Name: Matthew Weiss. MRN: [id number] DOB: September 06, 1940 Today's Date: 07/26/2018    History of Present Illness 78 year old man s/p L RCR with graft.  PMH:  RA, non ischemic cardiomyopathy   Clinical Impression   Pt was admitted for the above sx. All education was completed. He will follow up with Dr Gladstone Lighter     Follow Up Recommendations  Follow surgeon's recommendation for DC plan and follow-up therapies    Equipment Recommendations  None recommended by OT    Recommendations for Other Services       Precautions / Restrictions Precautions Precautions: Shoulder Type of Shoulder Precautions: sling on all times except for bathing, dressing and exercises.  May move elbow to fingers. No shoulder ROM Restrictions LUE Weight Bearing: Non weight bearing      Mobility Bed Mobility               General bed mobility comments: oob  Transfers Overall transfer level: Independent                    Balance                                           ADL either performed or assessed with clinical judgement   ADL Overall ADL's : Needs assistance/impaired Eating/Feeding: Independent   Grooming: Independent   Upper Body Bathing: Moderate assistance   Lower Body Bathing: Minimal assistance   Upper Body Dressing : Moderate assistance   Lower Body Dressing: Moderate assistance   Toilet Transfer: Independent   Toileting- Clothing Manipulation and Hygiene: Minimal assistance         General ADL Comments: assisted with washing LUE, under arm and getting dressed. Pt had a loose Tshirt. May switch to button down at home. We were able to get this on without jarring shoulder.  Pt walked in hall at independent level.  Reviewed protocol and pt verbalizes understanding. He does tend to recruit shoulder muscles during adls; educated to tell himself not to move L shoulder     Vision         Perception      Praxis      Pertinent Vitals/Pain Pain Assessment: No/denies pain(block in effect)     Hand Dominance Left   Extremity/Trunk Assessment Upper Extremity Assessment Upper Extremity Assessment: RUE deficits/detail(able to move fingers to elbow)           Communication Communication Communication: No difficulties   Cognition Arousal/Alertness: Awake/alert Behavior During Therapy: WFL for tasks assessed/performed Overall Cognitive Status: Within Functional Limits for tasks assessed                                     General Comments       Exercises     Shoulder Instructions      Home Living Family/patient expects to be discharged to:: Private residence Living Arrangements: Spouse/significant other                               Additional Comments: has a walk in shower      Prior Functioning/Environment Level of Independence: Independent        Comments: pt very active at baseline. Wife had  hip sx 5 weeks ago but can help as needed        OT Problem List:        OT Treatment/Interventions:      OT Goals(Current goals can be found in the care plan section) Acute Rehab OT Goals Patient Stated Goal: return to an independent active lifestyle OT Goal Formulation: All assessment and education complete, DC therapy  OT Frequency:     Barriers to D/C:            Co-evaluation              AM-PAC OT "6 Clicks" Daily Activity     Outcome Measure Help from another person eating meals?: None Help from another person taking care of personal grooming?: None Help from another person toileting, which includes using toliet, bedpan, or urinal?: A Little Help from another person bathing (including washing, rinsing, drying)?: A Little Help from another person to put on and taking off regular upper body clothing?: A Lot Help from another person to put on and taking off regular lower body clothing?: A Lot 6 Click Score: 18   End  of Session    Activity Tolerance: Patient tolerated treatment well Patient left: in chair;with call bell/phone within reach  OT Visit Diagnosis: Muscle weakness (generalized) (M62.81)                Time: 2620-3559 OT Time Calculation (min): 30 min Charges:  OT General Charges $OT Visit: 1 Visit OT Evaluation $OT Eval Low Complexity: 1 Low OT Treatments $Self Care/Home Management : 8-22 mins  Lesle Chris, OTR/L Acute Rehabilitation Services [phone] [facility] pager [phone] office 07/26/2018  [place] 07/26/2018, 9:58 AM

## 2018-08-01 DIAGNOSIS — I259 Chronic ischemic heart disease, unspecified: Secondary | ICD-10-CM | POA: Diagnosis not present

## 2018-08-01 DIAGNOSIS — E782 Mixed hyperlipidemia: Secondary | ICD-10-CM | POA: Diagnosis not present

## 2018-08-01 DIAGNOSIS — M069 Rheumatoid arthritis, unspecified: Secondary | ICD-10-CM | POA: Diagnosis not present

## 2018-08-01 DIAGNOSIS — I10 Essential (primary) hypertension: Secondary | ICD-10-CM | POA: Diagnosis not present

## 2018-08-01 DIAGNOSIS — H409 Unspecified glaucoma: Secondary | ICD-10-CM | POA: Diagnosis not present

## 2018-08-06 DIAGNOSIS — R21 Rash and other nonspecific skin eruption: Secondary | ICD-10-CM | POA: Diagnosis not present

## 2018-08-06 DIAGNOSIS — L299 Pruritus, unspecified: Secondary | ICD-10-CM | POA: Diagnosis not present

## 2018-08-28 ENCOUNTER — Ambulatory Visit (HOSPITAL_COMMUNITY): Payer: PPO | Attending: Orthopedic Surgery

## 2018-08-28 ENCOUNTER — Other Ambulatory Visit: Payer: Self-pay

## 2018-08-28 ENCOUNTER — Encounter (HOSPITAL_COMMUNITY): Payer: Self-pay

## 2018-08-28 DIAGNOSIS — R29898 Other symptoms and signs involving the musculoskeletal system: Secondary | ICD-10-CM | POA: Insufficient documentation

## 2018-08-28 DIAGNOSIS — M25612 Stiffness of left shoulder, not elsewhere classified: Secondary | ICD-10-CM | POA: Diagnosis not present

## 2018-08-28 NOTE — Therapy (Signed)
[place], [place], [address] Phone: [phone]   Fax:  [phone]  Occupational Therapy Evaluation  Patient Details  Name: Matthew Weiss. MRN: [id number] Date of Birth: 1940/05/01 Referring Provider (OT): Dr. Latanya Maudlin   Encounter Date: 08/28/2018  OT End of Session - 08/28/18 1437    Visit Number  1    Number of Visits  1    Authorization Type  [employer]    Authorization Time Period  $15 copay    OT Start Time  1330    OT Stop Time  1400    OT Time Calculation (min)  30 min    Activity Tolerance  Patient tolerated treatment well    Behavior During Therapy  [place] for tasks assessed/performed       Past Medical History:  Diagnosis Date  . Arthritis    RA  . Colon polyps   . Depression   . Dyslipidemia   . Hypertension   . LBBB (left bundle branch block)    Which comes and goes.  Marland Kitchen NICM (nonischemic cardiomyopathy) ([place])    Echo 09/22/09, EF 45-50%  . Rotator cuff tear   . Shortness of breath    exertion    Past Surgical History:  Procedure Laterality Date  . BACK SURGERY    . CARDIAC CATHETERIZATION  09/22/09   Mild non-obstructive CAD  . CARDIOVASCULAR STRESS TEST  06/23/2008   Exercise:  normal perfusion. Low risk.  . COLONOSCOPY  10/12/2011   Procedure: COLONOSCOPY;  Surgeon: Rogene Houston, MD;  Location: [facility];  Service: Endoscopy;  Laterality: N/A;  930  . COLONOSCOPY N/A 11/10/2016   Procedure: COLONOSCOPY;  Surgeon: Rogene Houston, MD;  Location: [facility];  Service: Endoscopy;  Laterality: N/A;  930  . EYE SURGERY     CATARACTS ; RIGHT EYE Jun 19, 2018, LEFT EYE JUNE 9TH, 2020 , SAW HIS EYE DOCTOR AFTER THE JUNE 9TH PROCEDURE  . LEFT FOOT SURGERY   2015  . POLYPECTOMY  11/10/2016   Procedure: POLYPECTOMY;  Surgeon: Rogene Houston, MD;  Location: [facility];  Service: Endoscopy;;  colon  . SHOULDER OPEN ROTATOR CUFF REPAIR Left 07/25/2018   Procedure:  Left shoulder rotator cuff repair with graft;  Surgeon: Latanya Maudlin, MD;  Location: [facility];  Service: Orthopedics;  Laterality: Left;  33min  . TRANSTHORACIC ECHOCARDIOGRAM  09/22/09   EF 45-50%.  No significant valvular disease.     There were no vitals filed for this visit.  Subjective Assessment - 08/28/18 1338    Subjective   S: I feel like my shoulder is ok.    Pertinent History  Patient is a 78 y/o male S/P left rotator cuff repair on 07/25/18. Patient was provided with exercises to complete after surgery. Dr. Gladstone Lighter referred patient to occupational therapy for evaluation and treatment.    Patient Stated Goals  To do HEP at home.    Currently in Pain?  No/denies        [facility] OT Assessment - 08/28/18 1338      Assessment   Medical Diagnosis  left shoulder rotator cuff repair    Referring Provider (OT)  Dr. Latanya Maudlin    Onset Date/Surgical Date  07/25/18    Hand Dominance  Right    Next MD Visit  09/14/18    Prior Therapy  None for this condition      Precautions   Precautions  Shoulder    Type of Shoulder Precautions  Gradual ROM. No forceful external rotation      Restrictions   Weight Bearing Restrictions  Yes      Balance Screen   Has the patient fallen in the past 6 months  No      Home  Environment   Family/patient expects to be discharged to:  Private residence      Prior Function   Level of Independence  Independent    Vocation  Part time employment   seasonal   Heritage manager for [employer]      ADL   ADL comments  No difficulty with any activites.      Mobility   Mobility Status  Independent      Written Expression   Dominant Hand  Right      Vision - History   Baseline Vision  No visual deficits      Cognition   Overall Cognitive Status  Within Functional Limits for tasks assessed      Observation/Other Assessments   Focus on Therapeutic Outcomes (FOTO)   N/A      ROM / Strength   AROM / PROM /  Strength  AROM;PROM;Strength      Palpation   Palpation comment  Min fascial restrictions in left lateral cervical region.      AROM   Overall AROM Comments  Assessed seated. IR/er adducted    AROM Assessment Site  Shoulder    Right/Left Shoulder  Left    Left Shoulder Flexion  130 Degrees    Left Shoulder ABduction  135 Degrees    Left Shoulder Internal Rotation  90 Degrees    Left Shoulder External Rotation  60 Degrees      PROM   Overall PROM   --    Overall PROM Comments  Assessed supine. IR/er adducted    PROM Assessment Site  Shoulder    Right/Left Shoulder  Left    Left Shoulder Flexion  152 Degrees    Left Shoulder ABduction  150 Degrees    Left Shoulder Internal Rotation  90 Degrees    Left Shoulder External Rotation  60 Degrees      Strength   Overall Strength Comments  Assessed seated. IR/er adducted    Strength Assessment Site  Shoulder    Right/Left Shoulder  Left    Left Shoulder Flexion  4-/5    Left Shoulder ABduction  4/5    Left Shoulder Internal Rotation  5/5    Left Shoulder External Rotation  3+/5                      OT Education - 08/28/18 1429    Education Details  Shoulder stretches and A/ROM exercises.    Person(s) Educated  Patient    Methods  Explanation;Demonstration;Verbal cues;Handout    Comprehension  Returned demonstration;Verbalized understanding       OT Short Term Goals - 08/28/18 1454      OT SHORT TERM GOAL #1   Title  Patient will be educated and independently with HEP in order to increase functional use of his left UE and allow him to return to completing all daily tasks with increased independence.    Time  1    Period  Days    Status  Achieved    Target Date  08/28/18               Plan -  08/28/18 1444    Clinical Impression Statement  A: Patient is a 78 y/o male S/P left rotator cuff repair causing increased fascial restrictions and decreased ROM and strength resulting in difficulty completing  daily tasks using his LUE. Patient reports when that he does not feel he needs to attend therapy for his shoulder. He has not attempted any lifting although he is able to complete all tasks that he needs to. Patient was provided with HEP to complete at home independently.    OT Occupational Profile and History  Problem Focused Assessment - Including review of records relating to presenting problem    Body Structure / Function / Physical Skills  Strength;IADL;ROM;Flexibility;Fascial restriction;ADL;UE functional use    Rehab Potential  Excellent    Clinical Decision Making  Limited treatment options, no task modification necessary    Comorbidities Affecting Occupational Performance:  None    Modification or Assistance to Complete Evaluation   No modification of tasks or assist necessary to complete eval    OT Frequency  One time visit    OT Treatment/Interventions  Patient/family education    Plan  P: 1 time visit with HEP only. Patient is to follow up with MD on 09/14/18 and inquire about returning to strength training.    Consulted and Agree with Plan of Care  Patient       Patient will benefit from skilled therapeutic intervention in order to improve the following deficits and impairments:   Body Structure / Function / Physical Skills: Strength, IADL, ROM, Flexibility, Fascial restriction, ADL, UE functional use       Visit Diagnosis: 1. Other symptoms and signs involving the musculoskeletal system   2. Stiffness of left shoulder, not elsewhere classified       Problem List Patient Active Problem List   Diagnosis Date Noted  . Partial nontraumatic tear of rotator cuff 07/25/2018  . Hypercholesterolemia 02/02/2018  . Hx of colonic polyps 10/05/2016  . Essential hypertension 10/03/2012  . NICM (nonischemic cardiomyopathy): last echo 09/22/09.  EF 45-50% 09/27/2012  . Dyslipidemia 09/27/2012  . LBBB (left bundle branch block),  [place]  09/27/2012  . Pain in joint, shoulder  region 02/25/2008  . IMPINGEMENT SYNDROME 02/25/2008  . RUPTURE ROTATOR CUFF 02/25/2008     Matthew Weiss, OTR/L,CBIS  [phone]  08/28/2018, 2:59 PM  [place] [address] [place], [place], [address] Phone: [phone]   Fax:  [phone]  Name: Matthew Weiss. MRN: [id number] Date of Birth: 1940-02-11

## 2018-08-28 NOTE — Patient Instructions (Signed)
Complete the following exercises 2-3 times a day.  Doorway Stretch  Place each hand opposite each other on the doorway. (You can change where you feel the stretch by moving arms higher or lower.) Step through with one foot and bend front knee until a stretch is felt and hold. Step through with the opposite foot on the next rep. Hold for __20___ seconds. Repeat __2__times.      Wall Flexion  Slide your arm up the wall or door frame until a stretch is felt in your shoulder . Hold for 20 seconds. Complete 2 times     Shoulder Abduction Stretch  Stand side ways by a wall with affected up on wall. Gently step in toward wall to feel stretch. Hold for 20 seconds. Complete 2 times.      Repeat all exercises 10-15 times, 1-2 times per day.  1) Shoulder Protraction    Begin with elbows by your side, slowly "punch" straight out in front of you.      2) Shoulder Flexion   Standing:         Begin with arms at your side with thumbs pointed up, slowly raise both arms up and forward towards overhead.         3) Horizontal abduction/adduction    Standing:           Begin with arms straight out in front of you, bring out to the side in at "T" shape. Keep arms straight entire time.       5) Shoulder Abduction   Standing:

## 2018-08-30 ENCOUNTER — Other Ambulatory Visit: Payer: Self-pay

## 2018-09-14 DIAGNOSIS — H9202 Otalgia, left ear: Secondary | ICD-10-CM | POA: Diagnosis not present

## 2018-09-19 DIAGNOSIS — R7303 Prediabetes: Secondary | ICD-10-CM | POA: Diagnosis not present

## 2018-09-19 DIAGNOSIS — E782 Mixed hyperlipidemia: Secondary | ICD-10-CM | POA: Diagnosis not present

## 2018-09-19 DIAGNOSIS — I10 Essential (primary) hypertension: Secondary | ICD-10-CM | POA: Diagnosis not present

## 2018-09-19 DIAGNOSIS — R7301 Impaired fasting glucose: Secondary | ICD-10-CM | POA: Diagnosis not present

## 2018-09-27 DIAGNOSIS — R7301 Impaired fasting glucose: Secondary | ICD-10-CM | POA: Diagnosis not present

## 2018-09-27 DIAGNOSIS — H409 Unspecified glaucoma: Secondary | ICD-10-CM | POA: Diagnosis not present

## 2018-09-27 DIAGNOSIS — E1169 Type 2 diabetes mellitus with other specified complication: Secondary | ICD-10-CM | POA: Diagnosis not present

## 2018-09-27 DIAGNOSIS — M25512 Pain in left shoulder: Secondary | ICD-10-CM | POA: Diagnosis not present

## 2018-09-27 DIAGNOSIS — I429 Cardiomyopathy, unspecified: Secondary | ICD-10-CM | POA: Diagnosis not present

## 2018-09-27 DIAGNOSIS — E782 Mixed hyperlipidemia: Secondary | ICD-10-CM | POA: Diagnosis not present

## 2018-09-27 DIAGNOSIS — S46101D Unspecified injury of muscle, fascia and tendon of long head of biceps, right arm, subsequent encounter: Secondary | ICD-10-CM | POA: Diagnosis not present

## 2018-09-27 DIAGNOSIS — R7303 Prediabetes: Secondary | ICD-10-CM | POA: Diagnosis not present

## 2018-09-27 DIAGNOSIS — J302 Other seasonal allergic rhinitis: Secondary | ICD-10-CM | POA: Diagnosis not present

## 2018-09-27 DIAGNOSIS — M069 Rheumatoid arthritis, unspecified: Secondary | ICD-10-CM | POA: Diagnosis not present

## 2018-09-27 DIAGNOSIS — I10 Essential (primary) hypertension: Secondary | ICD-10-CM | POA: Diagnosis not present

## 2018-10-02 DIAGNOSIS — S46101D Unspecified injury of muscle, fascia and tendon of long head of biceps, right arm, subsequent encounter: Secondary | ICD-10-CM | POA: Diagnosis not present

## 2018-10-02 DIAGNOSIS — M069 Rheumatoid arthritis, unspecified: Secondary | ICD-10-CM | POA: Diagnosis not present

## 2018-10-02 DIAGNOSIS — I10 Essential (primary) hypertension: Secondary | ICD-10-CM | POA: Diagnosis not present

## 2018-10-02 DIAGNOSIS — M25512 Pain in left shoulder: Secondary | ICD-10-CM | POA: Diagnosis not present

## 2018-10-02 DIAGNOSIS — R7301 Impaired fasting glucose: Secondary | ICD-10-CM | POA: Diagnosis not present

## 2018-10-02 DIAGNOSIS — I429 Cardiomyopathy, unspecified: Secondary | ICD-10-CM | POA: Diagnosis not present

## 2018-10-02 DIAGNOSIS — R7303 Prediabetes: Secondary | ICD-10-CM | POA: Diagnosis not present

## 2018-10-02 DIAGNOSIS — E1169 Type 2 diabetes mellitus with other specified complication: Secondary | ICD-10-CM | POA: Diagnosis not present

## 2018-10-02 DIAGNOSIS — E782 Mixed hyperlipidemia: Secondary | ICD-10-CM | POA: Diagnosis not present

## 2018-10-02 DIAGNOSIS — J302 Other seasonal allergic rhinitis: Secondary | ICD-10-CM | POA: Diagnosis not present

## 2018-10-02 DIAGNOSIS — H409 Unspecified glaucoma: Secondary | ICD-10-CM | POA: Diagnosis not present

## 2018-11-01 DIAGNOSIS — I10 Essential (primary) hypertension: Secondary | ICD-10-CM | POA: Diagnosis not present

## 2018-11-01 DIAGNOSIS — M069 Rheumatoid arthritis, unspecified: Secondary | ICD-10-CM | POA: Diagnosis not present

## 2018-11-01 DIAGNOSIS — H409 Unspecified glaucoma: Secondary | ICD-10-CM | POA: Diagnosis not present

## 2018-11-01 DIAGNOSIS — R7303 Prediabetes: Secondary | ICD-10-CM | POA: Diagnosis not present

## 2018-11-01 DIAGNOSIS — S46101D Unspecified injury of muscle, fascia and tendon of long head of biceps, right arm, subsequent encounter: Secondary | ICD-10-CM | POA: Diagnosis not present

## 2018-11-01 DIAGNOSIS — E782 Mixed hyperlipidemia: Secondary | ICD-10-CM | POA: Diagnosis not present

## 2018-11-01 DIAGNOSIS — R7301 Impaired fasting glucose: Secondary | ICD-10-CM | POA: Diagnosis not present

## 2018-11-01 DIAGNOSIS — J302 Other seasonal allergic rhinitis: Secondary | ICD-10-CM | POA: Diagnosis not present

## 2018-11-01 DIAGNOSIS — I429 Cardiomyopathy, unspecified: Secondary | ICD-10-CM | POA: Diagnosis not present

## 2018-11-01 DIAGNOSIS — E1169 Type 2 diabetes mellitus with other specified complication: Secondary | ICD-10-CM | POA: Diagnosis not present

## 2018-11-01 DIAGNOSIS — M25512 Pain in left shoulder: Secondary | ICD-10-CM | POA: Diagnosis not present

## 2018-11-12 DIAGNOSIS — B356 Tinea cruris: Secondary | ICD-10-CM | POA: Diagnosis not present

## 2018-12-26 ENCOUNTER — Other Ambulatory Visit: Payer: Self-pay

## 2018-12-31 DIAGNOSIS — R7301 Impaired fasting glucose: Secondary | ICD-10-CM | POA: Diagnosis not present

## 2018-12-31 DIAGNOSIS — E782 Mixed hyperlipidemia: Secondary | ICD-10-CM | POA: Diagnosis not present

## 2018-12-31 DIAGNOSIS — I10 Essential (primary) hypertension: Secondary | ICD-10-CM | POA: Diagnosis not present

## 2018-12-31 DIAGNOSIS — E1169 Type 2 diabetes mellitus with other specified complication: Secondary | ICD-10-CM | POA: Diagnosis not present

## 2018-12-31 DIAGNOSIS — R7303 Prediabetes: Secondary | ICD-10-CM | POA: Diagnosis not present

## 2019-01-02 DIAGNOSIS — I10 Essential (primary) hypertension: Secondary | ICD-10-CM | POA: Diagnosis not present

## 2019-01-02 DIAGNOSIS — E1169 Type 2 diabetes mellitus with other specified complication: Secondary | ICD-10-CM | POA: Diagnosis not present

## 2019-01-02 DIAGNOSIS — E782 Mixed hyperlipidemia: Secondary | ICD-10-CM | POA: Diagnosis not present

## 2019-01-02 DIAGNOSIS — J302 Other seasonal allergic rhinitis: Secondary | ICD-10-CM | POA: Diagnosis not present

## 2019-01-02 DIAGNOSIS — I429 Cardiomyopathy, unspecified: Secondary | ICD-10-CM | POA: Diagnosis not present

## 2019-01-02 DIAGNOSIS — S46101D Unspecified injury of muscle, fascia and tendon of long head of biceps, right arm, subsequent encounter: Secondary | ICD-10-CM | POA: Diagnosis not present

## 2019-01-02 DIAGNOSIS — H409 Unspecified glaucoma: Secondary | ICD-10-CM | POA: Diagnosis not present

## 2019-01-02 DIAGNOSIS — M069 Rheumatoid arthritis, unspecified: Secondary | ICD-10-CM | POA: Diagnosis not present

## 2019-01-02 DIAGNOSIS — M25512 Pain in left shoulder: Secondary | ICD-10-CM | POA: Diagnosis not present

## 2019-02-08 DIAGNOSIS — H40033 Anatomical narrow angle, bilateral: Secondary | ICD-10-CM | POA: Diagnosis not present

## 2019-03-08 DIAGNOSIS — I10 Essential (primary) hypertension: Secondary | ICD-10-CM | POA: Diagnosis not present

## 2019-03-08 DIAGNOSIS — M069 Rheumatoid arthritis, unspecified: Secondary | ICD-10-CM | POA: Diagnosis not present

## 2019-03-08 DIAGNOSIS — H409 Unspecified glaucoma: Secondary | ICD-10-CM | POA: Diagnosis not present

## 2019-03-08 DIAGNOSIS — I259 Chronic ischemic heart disease, unspecified: Secondary | ICD-10-CM | POA: Diagnosis not present

## 2019-03-08 DIAGNOSIS — E782 Mixed hyperlipidemia: Secondary | ICD-10-CM | POA: Diagnosis not present

## 2019-03-13 ENCOUNTER — Other Ambulatory Visit: Payer: Self-pay

## 2019-03-13 ENCOUNTER — Ambulatory Visit: Payer: PPO | Admitting: Cardiovascular Disease

## 2019-03-13 ENCOUNTER — Encounter: Payer: Self-pay | Admitting: Cardiovascular Disease

## 2019-03-13 VITALS — BP 129/70 | HR 64 | Temp 97.7°F | Ht 68.0 in | Wt 157.4 lb

## 2019-03-13 DIAGNOSIS — I10 Essential (primary) hypertension: Secondary | ICD-10-CM | POA: Diagnosis not present

## 2019-03-13 DIAGNOSIS — I428 Other cardiomyopathies: Secondary | ICD-10-CM | POA: Diagnosis not present

## 2019-03-13 DIAGNOSIS — E7800 Pure hypercholesterolemia, unspecified: Secondary | ICD-10-CM

## 2019-03-13 DIAGNOSIS — I447 Left bundle-branch block, unspecified: Secondary | ICD-10-CM

## 2019-03-13 NOTE — Progress Notes (Signed)
Cardiology Office Note    Date:  03/13/2019   ID:  Matthew Weiss., DOB 05-20-1940, MRN [id number]  PCP:  Celene Squibb, MD  Cardiologist:   Sanda Klein, MD   Chief Complaint  Patient presents with  . Cardiomyopathy    History of Present Illness:  Matthew Weiss. is a 79 y.o. male with mild nonischemic cardiomyopathy, rate related left bundle branch block, hypertension returning for follow-up.   He feels well and has no cardiovascular complaints.  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  He has not been able to go to the [facility] due to the coronavirus pandemic, but he still walks around his neighborhood.  All other systems are reviewed and are negative.  His biggest complaints surround stiff joints are related to rheumatoid arthritis, for which he takes methotrexate once weekly as well as moderate dose steroids (prednisone 5 mg twice daily).  He takes a statin for hyperlipidemia and has an excellent lipid profile (12/31/2018 total cholesterol 135, HDL 60, triglycerides 47).  He has left bundle branch block on most of his tracings, although when his heart rate was particularly slow a few years ago,  he conducted with a narrow QRS.  Today he has a perfectly broad QRS of 148 ms although his heart rate is relatively slow at 64 bpm.  He has mildly depressed left ventricular systolic function with an ejection fraction of 45-50% (last echo reviewed 2011) and is taking both ace inhibitors and beta blockers.  He had a normal nuclear treadmill stress test in 2010, exercised to 13 mets.   Past Medical History:  Diagnosis Date  . Arthritis    RA  . Colon polyps   . Depression   . Dyslipidemia   . Hypertension   . LBBB (left bundle branch block)    Which comes and goes.  Marland Kitchen NICM (nonischemic cardiomyopathy) ([place])    Echo 09/22/09, EF 45-50%  . Rotator cuff tear   . Shortness of breath    exertion    Past Surgical History:  Procedure Laterality Date  . BACK SURGERY     . CARDIAC CATHETERIZATION  09/22/09   Mild non-obstructive CAD  . CARDIOVASCULAR STRESS TEST  06/23/2008   Exercise:  normal perfusion. Low risk.  . COLONOSCOPY  10/12/2011   Procedure: COLONOSCOPY;  Surgeon: Rogene Houston, MD;  [facility];  Service: Endoscopy;  Laterality: N/A;  930  . COLONOSCOPY N/A 11/10/2016   Procedure: COLONOSCOPY;  Surgeon: Rogene Houston, MD;  [facility];  Service: Endoscopy;  Laterality: N/A;  930  . EYE SURGERY     CATARACTS ; RIGHT EYE Jun 19, 2018, LEFT EYE JUNE 9TH, 2020 , SAW HIS EYE DOCTOR AFTER THE JUNE 9TH PROCEDURE  . LEFT FOOT SURGERY   2015  . POLYPECTOMY  11/10/2016   Procedure: POLYPECTOMY;  Surgeon: Rogene Houston, MD;  [facility];  Service: Endoscopy;;  colon  . SHOULDER OPEN ROTATOR CUFF REPAIR Left 07/25/2018   Procedure: Left shoulder rotator cuff repair with graft;  Surgeon: Latanya Maudlin, MD;  Location: [facility];  Service: Orthopedics;  Laterality: Left;  27min  . TRANSTHORACIC ECHOCARDIOGRAM  09/22/09   EF 45-50%.  No significant valvular disease.     Current Medications: Outpatient Medications Prior to Visit  Medication Sig Dispense Refill  . amLODipine (NORVASC) 10 MG tablet Take 10 mg by mouth daily.     Marland Kitchen aspirin EC 81 MG  tablet Take 1 tablet (81 mg total) by mouth daily.    . carvedilol (COREG) 6.25 MG tablet Take 1 tablet (6.25 mg total) by mouth 2 (two) times daily. 60 tablet 11  . dorzolamide-timolol (COSOPT) 22.3-6.8 MG/ML ophthalmic solution Place 1 drop into both eyes 2 (two) times daily.    . folic acid (FOLVITE) 1 MG tablet Take 1 mg by mouth daily.    Marland Kitchen latanoprost (XALATAN) 0.005 % ophthalmic solution Place 1 drop into both eyes at bedtime.     Marland Kitchen lisinopril (PRINIVIL,ZESTRIL) 40 MG tablet Take 40 mg by mouth daily.    . methotrexate (RHEUMATREX) 2.5 MG tablet Take 20 mg by mouth once a week.     . multivitamin (ONE-A-DAY MEN'S) TABS tablet Take 1 tablet by mouth daily.    . Omega-3  350 MG CPDR Take 350 mg by mouth daily.    . predniSONE (DELTASONE) 5 MG tablet Take 5 mg by mouth 2 (two) times daily with a meal.     No facility-administered medications prior to visit.     Allergies:   Patient has no known allergies.   Social History   Socioeconomic History  . Marital status: Married    Spouse name: Not on file  . Number of children: Not on file  . Years of education: Not on file  . Highest education level: Not on file  Occupational History  . Not on file  Tobacco Use  . Smoking status: Former Smoker    Years: 15.00  . Smokeless tobacco: Never Used  Substance and Sexual Activity  . Alcohol use: No  . Drug use: No  . Sexual activity: Not on file  Other Topics Concern  . Not on file  Social History Narrative  . Not on file   Social Determinants of Health   Financial Resource Strain:   . Difficulty of Paying Living Expenses: Not on file  Food Insecurity:   . Worried About Charity fundraiser in the Last Year: Not on file  . Ran Out of Food in the Last Year: Not on file  Transportation Needs:   . Lack of Transportation (Medical): Not on file  . Lack of Transportation (Non-Medical): Not on file  Physical Activity:   . Days of Exercise per Week: Not on file  . Minutes of Exercise per Session: Not on file  Stress:   . Feeling of Stress : Not on file  Social Connections:   . Frequency of Communication with Friends and Family: Not on file  . Frequency of Social Gatherings with Friends and Family: Not on file  . Attends Religious Services: Not on file  . Active Member of Clubs or Organizations: Not on file  . Attends Archivist Meetings: Not on file  . Marital Status: Not on file      ROS:   Please see the history of present illness.    ROS All other systems are reviewed and are negative.   PHYSICAL EXAM:   VS:  BP 129/70   Pulse 64   Temp 97.7 F (36.5 C)   Ht 5\' 8"  (1.727 m)   Wt 157 lb 6.4 oz (71.4 kg)   SpO2 100%   BMI  23.93 kg/m     General: Alert, oriented x3, no distress, lean and fit, appears younger than stated age Head: no evidence of trauma, PERRL, EOMI, no exophtalmos or lid lag, no myxedema, no xanthelasma; normal ears, nose and oropharynx Neck: normal jugular venous  pulsations and no hepatojugular reflux; brisk carotid pulses without delay and no carotid bruits Chest: clear to auscultation, no signs of consolidation by percussion or palpation, normal fremitus, symmetrical and full respiratory excursions Cardiovascular: normal position and quality of the apical impulse, regular rhythm, normal first and paradoxically split second heart sounds, no murmurs, rubs or gallops Abdomen: no tenderness or distention, no masses by palpation, no abnormal pulsatility or arterial bruits, normal bowel sounds, no hepatosplenomegaly Extremities: no clubbing, cyanosis or edema; 2+ radial, ulnar and brachial pulses bilaterally; 2+ right femoral, posterior tibial and dorsalis pedis pulses; 2+ left femoral, posterior tibial and dorsalis pedis pulses; no subclavian or femoral bruits Neurological: grossly nonfocal Psych: Normal mood and affect   Wt Readings from Last 3 Encounters:  03/13/19 157 lb 6.4 oz (71.4 kg)  07/25/18 157 lb (71.2 kg)  07/20/18 157 lb (71.2 kg)    Studies/Labs Reviewed:   EKG:  EKG is ordered today.  The ekg ordered today shows sinus rhythm with first-degree AV block (PR 210 ms), left bundle branch block (148 ms), QTC 460 ms  Recent Labs: 07/20/2018: ALT 15; BUN 17; Creatinine, Ser 1.06; Hemoglobin 13.1; Platelets 202; Potassium 4.4; Sodium 141   Lipid Panel    Component Value Date/Time   CHOL  09/23/2009 0406    109        ATP III CLASSIFICATION:  <200     mg/dL   Desirable  200-239  mg/dL   Borderline High  >=240    mg/dL   High          TRIG 28 09/23/2009 0406   HDL 45 09/23/2009 0406   CHOLHDL 2.4 09/23/2009 0406   VLDL 6 09/23/2009 0406   LDLCALC  09/23/2009 0406    58         Total Cholesterol/HDL:CHD Risk Coronary Heart Disease Risk Table                     Men   Women  1/2 Average Risk   3.4   3.3  Average Risk       5.0   4.4  2 X Average Risk   9.6   7.1  3 X Average Risk  23.4   11.0        Use the calculated Patient Ratio above and the CHD Risk Table to determine the patient's CHD Risk.        ATP III CLASSIFICATION (LDL):  <100     mg/dL   Optimal  100-129  mg/dL   Near or Above                    Optimal  130-159  mg/dL   Borderline  160-189  mg/dL   High  >190     mg/dL   Very High   12/31/2018 total cholesterol 135, HDL 60, triglycerides 47   ASSESSMENT:    1. NICM (nonischemic cardiomyopathy): last echo 09/22/09.  EF 45-50%   2. LBBB (left bundle branch block)   3. Essential hypertension   4. Hypercholesterolemia      PLAN:  In order of problems listed above:  1. CMP: NYHA functional class I and euvolemic without the use of diuretics.  Mild decrease in LVEF could be entirely explained by LBBB-related dyssynchrony 2. HTN: Excellent control 3. LBBB: Initially rate related, now probably a permanent conduction abnormality, also has first-degree AV block.  No symptoms of high-grade AV block that suggest need for pacemaker  implantation.  Does not require resynchronization therapy at this time.  4. HLP: all lipid parameters in acceptable range.  Medication Adjustments/Labs and Tests Ordered: Current medicines are reviewed at length with the patient today.  Concerns regarding medicines are outlined above.  Medication changes, Labs and Tests ordered today are listed in the Patient Instructions below. Patient Instructions  Medication Instructions:  Your physician recommends that you continue on your current medications as directed. Please refer to the Current Medication list given to you today.  If you need a refill on your cardiac medications before your next appointment, please call your pharmacy.   Lab  work: NONE  Testing/Procedures: NONE  Follow-Up: At [facility], you and your health needs are our priority.  As part of our continuing mission to provide you with exceptional heart care, we have created designated Provider Care Teams.  These Care Teams include your primary Cardiologist (physician) and Advanced Practice Providers (APPs -  Physician Assistants and Nurse Practitioners) who all work together to provide you with the care you need, when you need it. You may see Sanda Klein, MD or one of the following Advanced Practice Providers on your designated Care Team:    Almyra Deforest, PA-C  Fabian Sharp, [place] or   Roby Lofts, [place]  Your physician wants you to follow-up in: 1 year with Dr. Shan Levans will receive a reminder letter in the mail two months in advance. If you don't receive a letter, please call our office to schedule the follow-up appointment.          Signed, Sanda Klein, MD  03/13/2019 5:20 PM    [facility] [address], [place], [place]  [address] Phone: [phone]; Fax: [phone]

## 2019-03-13 NOTE — Patient Instructions (Signed)
Medication Instructions:  Your physician recommends that you continue on your current medications as directed. Please refer to the Current Medication list given to you today.  If you need a refill on your cardiac medications before your next appointment, please call your pharmacy.   Lab work: NONE  Testing/Procedures: NONE  Follow-Up: At [facility], you and your health needs are our priority.  As part of our continuing mission to provide you with exceptional heart care, we have created designated Provider Care Teams.  These Care Teams include your primary Cardiologist (physician) and Advanced Practice Providers (APPs -  Physician Assistants and Nurse Practitioners) who all work together to provide you with the care you need, when you need it. You may see Sanda Klein, MD or one of the following Advanced Practice Providers on your designated Care Team:    Almyra Deforest, PA-C  Fabian Sharp, [place] or   Roby Lofts, [place]  Your physician wants you to follow-up in: 1 year with Dr. Shan Levans will receive a reminder letter in the mail two months in advance. If you don't receive a letter, please call our office to schedule the follow-up appointment.

## 2019-04-15 DIAGNOSIS — I10 Essential (primary) hypertension: Secondary | ICD-10-CM | POA: Diagnosis not present

## 2019-04-15 DIAGNOSIS — I259 Chronic ischemic heart disease, unspecified: Secondary | ICD-10-CM | POA: Diagnosis not present

## 2019-04-15 DIAGNOSIS — H409 Unspecified glaucoma: Secondary | ICD-10-CM | POA: Diagnosis not present

## 2019-04-15 DIAGNOSIS — M069 Rheumatoid arthritis, unspecified: Secondary | ICD-10-CM | POA: Diagnosis not present

## 2019-04-15 DIAGNOSIS — E782 Mixed hyperlipidemia: Secondary | ICD-10-CM | POA: Diagnosis not present

## 2019-05-09 DIAGNOSIS — E875 Hyperkalemia: Secondary | ICD-10-CM | POA: Diagnosis not present

## 2019-05-09 DIAGNOSIS — E782 Mixed hyperlipidemia: Secondary | ICD-10-CM | POA: Diagnosis not present

## 2019-05-09 DIAGNOSIS — I2510 Atherosclerotic heart disease of native coronary artery without angina pectoris: Secondary | ICD-10-CM | POA: Diagnosis not present

## 2019-05-09 DIAGNOSIS — E1169 Type 2 diabetes mellitus with other specified complication: Secondary | ICD-10-CM | POA: Diagnosis not present

## 2019-05-09 DIAGNOSIS — H409 Unspecified glaucoma: Secondary | ICD-10-CM | POA: Diagnosis not present

## 2019-05-09 DIAGNOSIS — H9202 Otalgia, left ear: Secondary | ICD-10-CM | POA: Diagnosis not present

## 2019-05-09 DIAGNOSIS — J302 Other seasonal allergic rhinitis: Secondary | ICD-10-CM | POA: Diagnosis not present

## 2019-05-09 DIAGNOSIS — I429 Cardiomyopathy, unspecified: Secondary | ICD-10-CM | POA: Diagnosis not present

## 2019-05-09 DIAGNOSIS — I10 Essential (primary) hypertension: Secondary | ICD-10-CM | POA: Diagnosis not present

## 2019-05-09 DIAGNOSIS — L299 Pruritus, unspecified: Secondary | ICD-10-CM | POA: Diagnosis not present

## 2019-05-09 DIAGNOSIS — I259 Chronic ischemic heart disease, unspecified: Secondary | ICD-10-CM | POA: Diagnosis not present

## 2019-05-09 DIAGNOSIS — M069 Rheumatoid arthritis, unspecified: Secondary | ICD-10-CM | POA: Diagnosis not present

## 2019-05-14 DIAGNOSIS — Z0001 Encounter for general adult medical examination with abnormal findings: Secondary | ICD-10-CM | POA: Diagnosis not present

## 2019-05-14 DIAGNOSIS — H409 Unspecified glaucoma: Secondary | ICD-10-CM | POA: Diagnosis not present

## 2019-05-14 DIAGNOSIS — M069 Rheumatoid arthritis, unspecified: Secondary | ICD-10-CM | POA: Diagnosis not present

## 2019-05-14 DIAGNOSIS — E782 Mixed hyperlipidemia: Secondary | ICD-10-CM | POA: Diagnosis not present

## 2019-05-14 DIAGNOSIS — J302 Other seasonal allergic rhinitis: Secondary | ICD-10-CM | POA: Diagnosis not present

## 2019-05-14 DIAGNOSIS — I429 Cardiomyopathy, unspecified: Secondary | ICD-10-CM | POA: Diagnosis not present

## 2019-05-14 DIAGNOSIS — S46101D Unspecified injury of muscle, fascia and tendon of long head of biceps, right arm, subsequent encounter: Secondary | ICD-10-CM | POA: Diagnosis not present

## 2019-05-14 DIAGNOSIS — I10 Essential (primary) hypertension: Secondary | ICD-10-CM | POA: Diagnosis not present

## 2019-05-14 DIAGNOSIS — E1169 Type 2 diabetes mellitus with other specified complication: Secondary | ICD-10-CM | POA: Diagnosis not present

## 2019-05-14 DIAGNOSIS — M25512 Pain in left shoulder: Secondary | ICD-10-CM | POA: Diagnosis not present

## 2019-05-23 DIAGNOSIS — E782 Mixed hyperlipidemia: Secondary | ICD-10-CM | POA: Diagnosis not present

## 2019-05-23 DIAGNOSIS — I259 Chronic ischemic heart disease, unspecified: Secondary | ICD-10-CM | POA: Diagnosis not present

## 2019-05-23 DIAGNOSIS — M069 Rheumatoid arthritis, unspecified: Secondary | ICD-10-CM | POA: Diagnosis not present

## 2019-05-23 DIAGNOSIS — H409 Unspecified glaucoma: Secondary | ICD-10-CM | POA: Diagnosis not present

## 2019-05-23 DIAGNOSIS — I10 Essential (primary) hypertension: Secondary | ICD-10-CM | POA: Diagnosis not present

## 2019-06-04 DIAGNOSIS — I259 Chronic ischemic heart disease, unspecified: Secondary | ICD-10-CM | POA: Diagnosis not present

## 2019-06-04 DIAGNOSIS — I10 Essential (primary) hypertension: Secondary | ICD-10-CM | POA: Diagnosis not present

## 2019-06-04 DIAGNOSIS — M069 Rheumatoid arthritis, unspecified: Secondary | ICD-10-CM | POA: Diagnosis not present

## 2019-06-04 DIAGNOSIS — H409 Unspecified glaucoma: Secondary | ICD-10-CM | POA: Diagnosis not present

## 2019-06-04 DIAGNOSIS — E782 Mixed hyperlipidemia: Secondary | ICD-10-CM | POA: Diagnosis not present

## 2019-07-30 DIAGNOSIS — M069 Rheumatoid arthritis, unspecified: Secondary | ICD-10-CM | POA: Diagnosis not present

## 2019-07-30 DIAGNOSIS — H409 Unspecified glaucoma: Secondary | ICD-10-CM | POA: Diagnosis not present

## 2019-07-30 DIAGNOSIS — E782 Mixed hyperlipidemia: Secondary | ICD-10-CM | POA: Diagnosis not present

## 2019-07-30 DIAGNOSIS — I10 Essential (primary) hypertension: Secondary | ICD-10-CM | POA: Diagnosis not present

## 2019-07-30 DIAGNOSIS — I259 Chronic ischemic heart disease, unspecified: Secondary | ICD-10-CM | POA: Diagnosis not present

## 2019-08-02 DIAGNOSIS — E782 Mixed hyperlipidemia: Secondary | ICD-10-CM | POA: Diagnosis not present

## 2019-08-02 DIAGNOSIS — I10 Essential (primary) hypertension: Secondary | ICD-10-CM | POA: Diagnosis not present

## 2019-08-02 DIAGNOSIS — I259 Chronic ischemic heart disease, unspecified: Secondary | ICD-10-CM | POA: Diagnosis not present

## 2019-08-02 DIAGNOSIS — H409 Unspecified glaucoma: Secondary | ICD-10-CM | POA: Diagnosis not present

## 2019-08-02 DIAGNOSIS — M069 Rheumatoid arthritis, unspecified: Secondary | ICD-10-CM | POA: Diagnosis not present

## 2019-08-09 DIAGNOSIS — Z961 Presence of intraocular lens: Secondary | ICD-10-CM | POA: Diagnosis not present

## 2019-08-09 DIAGNOSIS — H40033 Anatomical narrow angle, bilateral: Secondary | ICD-10-CM | POA: Diagnosis not present

## 2019-09-04 DIAGNOSIS — E782 Mixed hyperlipidemia: Secondary | ICD-10-CM | POA: Diagnosis not present

## 2019-09-04 DIAGNOSIS — M069 Rheumatoid arthritis, unspecified: Secondary | ICD-10-CM | POA: Diagnosis not present

## 2019-09-04 DIAGNOSIS — I10 Essential (primary) hypertension: Secondary | ICD-10-CM | POA: Diagnosis not present

## 2019-09-06 DIAGNOSIS — E782 Mixed hyperlipidemia: Secondary | ICD-10-CM | POA: Diagnosis not present

## 2019-09-06 DIAGNOSIS — I10 Essential (primary) hypertension: Secondary | ICD-10-CM | POA: Diagnosis not present

## 2019-09-06 DIAGNOSIS — R7303 Prediabetes: Secondary | ICD-10-CM | POA: Diagnosis not present

## 2019-09-06 DIAGNOSIS — R7301 Impaired fasting glucose: Secondary | ICD-10-CM | POA: Diagnosis not present

## 2019-09-06 DIAGNOSIS — E1169 Type 2 diabetes mellitus with other specified complication: Secondary | ICD-10-CM | POA: Diagnosis not present

## 2019-09-10 DIAGNOSIS — J302 Other seasonal allergic rhinitis: Secondary | ICD-10-CM | POA: Diagnosis not present

## 2019-09-10 DIAGNOSIS — S46101D Unspecified injury of muscle, fascia and tendon of long head of biceps, right arm, subsequent encounter: Secondary | ICD-10-CM | POA: Diagnosis not present

## 2019-09-10 DIAGNOSIS — M25512 Pain in left shoulder: Secondary | ICD-10-CM | POA: Diagnosis not present

## 2019-09-10 DIAGNOSIS — E782 Mixed hyperlipidemia: Secondary | ICD-10-CM | POA: Diagnosis not present

## 2019-09-10 DIAGNOSIS — I10 Essential (primary) hypertension: Secondary | ICD-10-CM | POA: Diagnosis not present

## 2019-09-10 DIAGNOSIS — M069 Rheumatoid arthritis, unspecified: Secondary | ICD-10-CM | POA: Diagnosis not present

## 2019-09-10 DIAGNOSIS — I429 Cardiomyopathy, unspecified: Secondary | ICD-10-CM | POA: Diagnosis not present

## 2019-09-10 DIAGNOSIS — E1169 Type 2 diabetes mellitus with other specified complication: Secondary | ICD-10-CM | POA: Diagnosis not present

## 2019-09-10 DIAGNOSIS — H409 Unspecified glaucoma: Secondary | ICD-10-CM | POA: Diagnosis not present

## 2019-10-02 DIAGNOSIS — I429 Cardiomyopathy, unspecified: Secondary | ICD-10-CM | POA: Diagnosis not present

## 2019-10-02 DIAGNOSIS — I10 Essential (primary) hypertension: Secondary | ICD-10-CM | POA: Diagnosis not present

## 2019-10-02 DIAGNOSIS — E1169 Type 2 diabetes mellitus with other specified complication: Secondary | ICD-10-CM | POA: Diagnosis not present

## 2019-10-02 DIAGNOSIS — J302 Other seasonal allergic rhinitis: Secondary | ICD-10-CM | POA: Diagnosis not present

## 2019-10-02 DIAGNOSIS — E782 Mixed hyperlipidemia: Secondary | ICD-10-CM | POA: Diagnosis not present

## 2019-10-02 DIAGNOSIS — H409 Unspecified glaucoma: Secondary | ICD-10-CM | POA: Diagnosis not present

## 2019-10-02 DIAGNOSIS — M25512 Pain in left shoulder: Secondary | ICD-10-CM | POA: Diagnosis not present

## 2019-10-02 DIAGNOSIS — M069 Rheumatoid arthritis, unspecified: Secondary | ICD-10-CM | POA: Diagnosis not present

## 2019-10-02 DIAGNOSIS — S46101D Unspecified injury of muscle, fascia and tendon of long head of biceps, right arm, subsequent encounter: Secondary | ICD-10-CM | POA: Diagnosis not present

## 2019-11-12 ENCOUNTER — Encounter (HOSPITAL_COMMUNITY): Payer: Self-pay | Admitting: *Deleted

## 2019-11-12 ENCOUNTER — Other Ambulatory Visit: Payer: Self-pay

## 2019-11-12 ENCOUNTER — Emergency Department (HOSPITAL_COMMUNITY): Payer: PPO

## 2019-11-12 ENCOUNTER — Emergency Department (HOSPITAL_COMMUNITY)
Admission: EM | Admit: 2019-11-12 | Discharge: 2019-11-12 | Disposition: A | Discharge status: Home / Self Care | Payer: PPO | Attending: Emergency Medicine | Admitting: Emergency Medicine

## 2019-11-12 DIAGNOSIS — J3489 Other specified disorders of nose and nasal sinuses: Secondary | ICD-10-CM | POA: Insufficient documentation

## 2019-11-12 DIAGNOSIS — R110 Nausea: Secondary | ICD-10-CM | POA: Insufficient documentation

## 2019-11-12 DIAGNOSIS — Z87891 Personal history of nicotine dependence: Secondary | ICD-10-CM | POA: Insufficient documentation

## 2019-11-12 DIAGNOSIS — Z79899 Other long term (current) drug therapy: Secondary | ICD-10-CM | POA: Diagnosis not present

## 2019-11-12 DIAGNOSIS — I10 Essential (primary) hypertension: Secondary | ICD-10-CM | POA: Diagnosis not present

## 2019-11-12 DIAGNOSIS — J069 Acute upper respiratory infection, unspecified: Secondary | ICD-10-CM | POA: Insufficient documentation

## 2019-11-12 DIAGNOSIS — R109 Unspecified abdominal pain: Secondary | ICD-10-CM | POA: Diagnosis not present

## 2019-11-12 DIAGNOSIS — J8410 Pulmonary fibrosis, unspecified: Secondary | ICD-10-CM | POA: Diagnosis not present

## 2019-11-12 DIAGNOSIS — Z7982 Long term (current) use of aspirin: Secondary | ICD-10-CM | POA: Diagnosis not present

## 2019-11-12 DIAGNOSIS — R059 Cough, unspecified: Secondary | ICD-10-CM | POA: Diagnosis not present

## 2019-11-12 LAB — CBC WITH DIFFERENTIAL/PLATELET
Abs Immature Granulocytes: 0.02 10*3/uL (ref 0.00–0.07)
Basophils Absolute: 0 10*3/uL (ref 0.0–0.1)
Basophils Relative: 1 %
Eosinophils Absolute: 0 10*3/uL (ref 0.0–0.5)
Eosinophils Relative: 0 %
HCT: 42.9 % (ref 39.0–52.0)
Hemoglobin: 14 g/dL (ref 13.0–17.0)
Immature Granulocytes: 0 %
Lymphocytes Relative: 5 %
Lymphs Abs: 0.4 10*3/uL — ABNORMAL LOW (ref 0.7–4.0)
MCH: 29.3 pg (ref 26.0–34.0)
MCHC: 32.6 g/dL (ref 30.0–36.0)
MCV: 89.7 fL (ref 80.0–100.0)
Monocytes Absolute: 0.4 10*3/uL (ref 0.1–1.0)
Monocytes Relative: 6 %
Neutro Abs: 6.9 10*3/uL (ref 1.7–7.7)
Neutrophils Relative %: 88 %
Platelets: 207 10*3/uL (ref 150–400)
RBC: 4.78 MIL/uL (ref 4.22–5.81)
RDW: 13.9 % (ref 11.5–15.5)
WBC: 7.8 10*3/uL (ref 4.0–10.5)
nRBC: 0 % (ref 0.0–0.2)

## 2019-11-12 LAB — BASIC METABOLIC PANEL
Anion gap: 10 (ref 5–15)
BUN: 15 mg/dL (ref 8–23)
CO2: 23 mmol/L (ref 22–32)
Calcium: 9 mg/dL (ref 8.9–10.3)
Chloride: 105 mmol/L (ref 98–111)
Creatinine, Ser: 1.01 mg/dL (ref 0.61–1.24)
GFR, Estimated: >60 mL/min (ref >60)
Glucose, Bld: 140 mg/dL — ABNORMAL HIGH (ref 70–99)
Potassium: 3.9 mmol/L (ref 3.5–5.1)
Sodium: 138 mmol/L (ref 135–145)

## 2019-11-12 MED ORDER — ONDANSETRON 8 MG PO TBDP
8.0000 mg | ORAL_TABLET | Freq: Once | ORAL | Status: AC
  Administered 2019-11-12: 8 mg via ORAL
  Filled 2019-11-12: qty 1

## 2019-11-12 MED ORDER — ALBUTEROL SULFATE HFA 108 (90 BASE) MCG/ACT IN AERS
2.0000 | INHALATION_SPRAY | Freq: Once | RESPIRATORY_TRACT | Status: AC
  Administered 2019-11-12: 2 via RESPIRATORY_TRACT
  Filled 2019-11-12: qty 6.7

## 2019-11-12 NOTE — ED Triage Notes (Signed)
Pt received his flu shot on Monday, began to have abd pain with chills, cough on Tuesday.  Pt received both covid vaccines.

## 2019-11-12 NOTE — ED Provider Notes (Signed)
[facility] Provider Note   CSN: [id number] Arrival date & time: 11/12/19  1918   History Chief Complaint  Patient presents with  . Abdominal Pain    Matthew Weiss. is a 79 y.o. male.  The history is provided by the patient.  Abdominal Pain 79 year old male with history of hypertension, hyperlipidemia, pulmonary fibrosis comes in because of cough and nausea.  He had his flu shot yesterday.  Today, he noted moderately intense nausea but without vomiting.  He specifically denies abdominal pain.  He has had coughing paroxysms today.  He has chronic cough related to his pulmonary fibrosis, but the cough he had today is different.  Cough is nonproductive.  There has been some rhinorrhea which has been clear.  He denies fever, chills, sweats.  He denies diarrhea.  Denies arthralgias or myalgias.  There have been no known sick contacts.  He has been vaccinated against COVID-19.  Since arriving in the ED, nausea has resolved, but cough persists.  Past Medical History:  Diagnosis Date  . Arthritis    RA  . Colon polyps   . Depression   . Dyslipidemia   . Hypertension   . LBBB (left bundle branch block)    Which comes and goes.  Marland Kitchen NICM (nonischemic cardiomyopathy) ([place])    Echo 09/22/09, EF 45-50%  . Rotator cuff tear   . Shortness of breath    exertion    Patient Active Problem List   Diagnosis Date Noted  . Partial nontraumatic tear of rotator cuff 07/25/2018  . Hypercholesterolemia 02/02/2018  . Hx of colonic polyps 10/05/2016  . Essential hypertension 10/03/2012  . NICM (nonischemic cardiomyopathy): last echo 09/22/09.  EF 45-50% 09/27/2012  . Dyslipidemia 09/27/2012  . LBBB (left bundle branch block),  Intermitent  09/27/2012  . Pain in joint, shoulder region 02/25/2008  . IMPINGEMENT SYNDROME 02/25/2008  . RUPTURE ROTATOR CUFF 02/25/2008    Past Surgical History:  Procedure Laterality Date  . BACK SURGERY    . CARDIAC CATHETERIZATION  09/22/09    Mild non-obstructive CAD  . CARDIOVASCULAR STRESS TEST  06/23/2008   Exercise:  normal perfusion. Low risk.  . COLONOSCOPY  10/12/2011   Procedure: COLONOSCOPY;  Surgeon: Rogene Houston, MD;  [facility];  Service: Endoscopy;  Laterality: N/A;  930  . COLONOSCOPY N/A 11/10/2016   Procedure: COLONOSCOPY;  Surgeon: Rogene Houston, MD;  [facility];  Service: Endoscopy;  Laterality: N/A;  930  . EYE SURGERY     CATARACTS ; RIGHT EYE Jun 19, 2018, LEFT EYE JUNE 9TH, 2020 , SAW HIS EYE DOCTOR AFTER THE JUNE 9TH PROCEDURE  . LEFT FOOT SURGERY   2015  . POLYPECTOMY  11/10/2016   Procedure: POLYPECTOMY;  Surgeon: Rogene Houston, MD;  [facility];  Service: Endoscopy;;  colon  . SHOULDER OPEN ROTATOR CUFF REPAIR Left 07/25/2018   Procedure: Left shoulder rotator cuff repair with graft;  Surgeon: Latanya Maudlin, MD;  [facility];  Service: Orthopedics;  Laterality: Left;  65min  . TRANSTHORACIC ECHOCARDIOGRAM  09/22/09   EF 45-50%.  No significant valvular disease.        Family History  Problem Relation Age of Onset  . Colon cancer Neg Hx     Social History   Tobacco Use  . Smoking status: Former Smoker    Years: 15.00  . Smokeless tobacco: Never Used  Vaping Use  . Vaping Use: Never used  Substance  Use Topics  . Alcohol use: No  . Drug use: No    Home Medications Prior to Admission medications   Medication Sig Start Date End Date Taking? Authorizing Provider  amLODipine (NORVASC) 10 MG tablet Take 10 mg by mouth daily.  10/25/16   [provider]  aspirin EC 81 MG tablet Take 1 tablet (81 mg total) by mouth daily. 11/11/16   Rehman, Mechele Dawley, MD  carvedilol (COREG) 6.25 MG tablet Take 1 tablet (6.25 mg total) by mouth 2 (two) times daily. 10/30/15   Croitoru, Mihai, MD  dorzolamide-timolol (COSOPT) 22.3-6.8 MG/ML ophthalmic solution Place 1 drop into both eyes 2 (two) times daily.    [provider]  folic acid (FOLVITE)  1 MG tablet Take 1 mg by mouth daily. 07/24/13   [provider]  latanoprost (XALATAN) 0.005 % ophthalmic solution Place 1 drop into both eyes at bedtime.  10/28/15   [provider]  lisinopril (PRINIVIL,ZESTRIL) 40 MG tablet Take 40 mg by mouth daily.    [provider]  methotrexate (RHEUMATREX) 2.5 MG tablet Take 20 mg by mouth once a week.  09/27/13   [provider]  multivitamin (ONE-A-DAY MEN'S) TABS tablet Take 1 tablet by mouth daily.    [provider]  Omega-3 350 MG CPDR Take 350 mg by mouth daily.    [provider]  predniSONE (DELTASONE) 5 MG tablet Take 5 mg by mouth 2 (two) times daily with a meal.    [provider]    Allergies    Patient has no known allergies.  Review of Systems   Review of Systems  Gastrointestinal: Positive for abdominal pain.  All other systems reviewed and are negative.   Physical Exam Updated Vital Signs BP 139/71 (BP Location: Right Arm)   Pulse 88   Temp 99.6 F (37.6 C) (Oral)   Resp 18   Ht 5' 8.5" (1.74 m)   Wt 69.9 kg   SpO2 100%   BMI 23.08 kg/m   Physical Exam Vitals and nursing note reviewed.   79 year old male, resting comfortably and in no acute distress. Vital signs are normal. Oxygen saturation is 100%, which is normal. Head is normocephalic and atraumatic. PERRLA, EOMI. Oropharynx is clear. Neck is nontender and supple without adenopathy or JVD. Back is nontender and there is no CVA tenderness. Lungs have Velcro rales diffusely consistent with history of pulmonary fibrosis.  There is a slightly prolonged exhalation phase but no overt wheezes or rhonchi. Chest is nontender. Heart has regular rate and rhythm without murmur. Abdomen is soft, flat, nontender without masses or hepatosplenomegaly and peristalsis is hypoactive. Extremities have no cyanosis or edema, full range of motion is present. Skin is warm and dry without rash. Neurologic: Mental status is  normal, cranial nerves are intact, there are no motor or sensory deficits.  ED Results / Procedures / Treatments   Labs (all labs ordered are listed, but only abnormal results are displayed) Labs Reviewed  CBC WITH DIFFERENTIAL/PLATELET - Abnormal; Notable for the following components:      Result Value   Lymphs Abs 0.4 (*)    All other components within normal limits  BASIC METABOLIC PANEL - Abnormal; Notable for the following components:   Glucose, Bld 140 (*)    All other components within normal limits    Radiology DG Chest Portable 1 View  Result Date: 11/12/2019 CLINICAL DATA:  Cough, chills EXAM: PORTABLE CHEST 1 VIEW COMPARISON:  05/13/2013 FINDINGS:  Bibasilar pulmonary fibrosis is unchanged. Pulmonary insufflation remain stable since prior examination. There are sparse bilateral peripheral mid lung zone pulmonary infiltrates, nonspecific, but new since prior examination and likely infectious or inflammatory in the acute setting. No pneumothorax or pleural effusion. Cardiac size is within normal limits. Pulmonary vascularity is normal. No acute bone abnormality. IMPRESSION: Interval development of sparse bilateral mid lung zone pulmonary infiltrates, likely infectious or inflammatory. Stable changes of pulmonary fibrosis. Electronically Signed   By: Fidela Salisbury MD   On: 11/12/2019 22:12    Procedures Procedures   Medications Ordered in ED Medications  ondansetron (ZOFRAN-ODT) disintegrating tablet 8 mg (has no administration in time range)  albuterol (VENTOLIN HFA) 108 (90 Base) MCG/ACT inhaler 2 puff (has no administration in time range)    ED Course  I have reviewed the triage vital signs and the nursing notes.  Pertinent labs & imaging results that were available during my care of the patient were reviewed by me and considered in my medical decision making (see chart for details).  MDM Rules/Calculators/A&P Cough, nausea, rhinorrhea most consistent with a viral URI.   No red flags to suggest more serious pathology.  Chest x-ray shows no evidence of pneumonia, chronic changes of pulmonary fibrosis present.  WBC is normal as is hemoglobin, electrolytes, renal function.  Old records are reviewed, and he has no relevant past visits.  We will give therapeutic trial of albuterol inhaler.  Patient eloped following being given the inhaler.  Final Clinical Impression(s) / ED Diagnoses Final diagnoses:  Viral URI    Rx / DC Orders ED Discharge Orders    None       Delora Fuel, MD 50/27/74 [phone]

## 2019-12-13 DIAGNOSIS — D3709 Neoplasm of uncertain behavior of other specified sites of the oral cavity: Secondary | ICD-10-CM | POA: Diagnosis not present

## 2019-12-13 DIAGNOSIS — K1329 Other disturbances of oral epithelium, including tongue: Secondary | ICD-10-CM | POA: Diagnosis not present

## 2019-12-30 DIAGNOSIS — I259 Chronic ischemic heart disease, unspecified: Secondary | ICD-10-CM | POA: Diagnosis not present

## 2019-12-30 DIAGNOSIS — H409 Unspecified glaucoma: Secondary | ICD-10-CM | POA: Diagnosis not present

## 2019-12-30 DIAGNOSIS — E782 Mixed hyperlipidemia: Secondary | ICD-10-CM | POA: Diagnosis not present

## 2019-12-30 DIAGNOSIS — M069 Rheumatoid arthritis, unspecified: Secondary | ICD-10-CM | POA: Diagnosis not present

## 2019-12-30 DIAGNOSIS — I10 Essential (primary) hypertension: Secondary | ICD-10-CM | POA: Diagnosis not present

## 2020-01-20 DIAGNOSIS — R7301 Impaired fasting glucose: Secondary | ICD-10-CM | POA: Diagnosis not present

## 2020-01-20 DIAGNOSIS — I259 Chronic ischemic heart disease, unspecified: Secondary | ICD-10-CM | POA: Diagnosis not present

## 2020-01-20 DIAGNOSIS — E875 Hyperkalemia: Secondary | ICD-10-CM | POA: Diagnosis not present

## 2020-01-20 DIAGNOSIS — I429 Cardiomyopathy, unspecified: Secondary | ICD-10-CM | POA: Diagnosis not present

## 2020-01-20 DIAGNOSIS — I10 Essential (primary) hypertension: Secondary | ICD-10-CM | POA: Diagnosis not present

## 2020-01-20 DIAGNOSIS — E782 Mixed hyperlipidemia: Secondary | ICD-10-CM | POA: Diagnosis not present

## 2020-01-20 DIAGNOSIS — W458XXA Other foreign body or object entering through skin, initial encounter: Secondary | ICD-10-CM | POA: Diagnosis not present

## 2020-01-20 DIAGNOSIS — I2510 Atherosclerotic heart disease of native coronary artery without angina pectoris: Secondary | ICD-10-CM | POA: Diagnosis not present

## 2020-01-20 DIAGNOSIS — R7303 Prediabetes: Secondary | ICD-10-CM | POA: Diagnosis not present

## 2020-01-20 DIAGNOSIS — M069 Rheumatoid arthritis, unspecified: Secondary | ICD-10-CM | POA: Diagnosis not present

## 2020-01-20 DIAGNOSIS — R42 Dizziness and giddiness: Secondary | ICD-10-CM | POA: Diagnosis not present

## 2020-01-20 DIAGNOSIS — H409 Unspecified glaucoma: Secondary | ICD-10-CM | POA: Diagnosis not present

## 2020-01-23 DIAGNOSIS — H409 Unspecified glaucoma: Secondary | ICD-10-CM | POA: Diagnosis not present

## 2020-01-23 DIAGNOSIS — S46101D Unspecified injury of muscle, fascia and tendon of long head of biceps, right arm, subsequent encounter: Secondary | ICD-10-CM | POA: Diagnosis not present

## 2020-01-23 DIAGNOSIS — M25512 Pain in left shoulder: Secondary | ICD-10-CM | POA: Diagnosis not present

## 2020-01-23 DIAGNOSIS — E782 Mixed hyperlipidemia: Secondary | ICD-10-CM | POA: Diagnosis not present

## 2020-01-23 DIAGNOSIS — I10 Essential (primary) hypertension: Secondary | ICD-10-CM | POA: Diagnosis not present

## 2020-01-23 DIAGNOSIS — E1169 Type 2 diabetes mellitus with other specified complication: Secondary | ICD-10-CM | POA: Diagnosis not present

## 2020-01-23 DIAGNOSIS — I429 Cardiomyopathy, unspecified: Secondary | ICD-10-CM | POA: Diagnosis not present

## 2020-01-23 DIAGNOSIS — M069 Rheumatoid arthritis, unspecified: Secondary | ICD-10-CM | POA: Diagnosis not present

## 2020-01-23 DIAGNOSIS — J302 Other seasonal allergic rhinitis: Secondary | ICD-10-CM | POA: Diagnosis not present

## 2020-01-31 DIAGNOSIS — M069 Rheumatoid arthritis, unspecified: Secondary | ICD-10-CM | POA: Diagnosis not present

## 2020-01-31 DIAGNOSIS — I259 Chronic ischemic heart disease, unspecified: Secondary | ICD-10-CM | POA: Diagnosis not present

## 2020-01-31 DIAGNOSIS — I10 Essential (primary) hypertension: Secondary | ICD-10-CM | POA: Diagnosis not present

## 2020-01-31 DIAGNOSIS — E782 Mixed hyperlipidemia: Secondary | ICD-10-CM | POA: Diagnosis not present

## 2020-01-31 DIAGNOSIS — H409 Unspecified glaucoma: Secondary | ICD-10-CM | POA: Diagnosis not present

## 2020-02-07 DIAGNOSIS — H40033 Anatomical narrow angle, bilateral: Secondary | ICD-10-CM | POA: Diagnosis not present

## 2020-02-29 DIAGNOSIS — M25512 Pain in left shoulder: Secondary | ICD-10-CM | POA: Diagnosis not present

## 2020-02-29 DIAGNOSIS — E1169 Type 2 diabetes mellitus with other specified complication: Secondary | ICD-10-CM | POA: Diagnosis not present

## 2020-02-29 DIAGNOSIS — E782 Mixed hyperlipidemia: Secondary | ICD-10-CM | POA: Diagnosis not present

## 2020-02-29 DIAGNOSIS — S46101D Unspecified injury of muscle, fascia and tendon of long head of biceps, right arm, subsequent encounter: Secondary | ICD-10-CM | POA: Diagnosis not present

## 2020-02-29 DIAGNOSIS — J302 Other seasonal allergic rhinitis: Secondary | ICD-10-CM | POA: Diagnosis not present

## 2020-02-29 DIAGNOSIS — I10 Essential (primary) hypertension: Secondary | ICD-10-CM | POA: Diagnosis not present

## 2020-02-29 DIAGNOSIS — I429 Cardiomyopathy, unspecified: Secondary | ICD-10-CM | POA: Diagnosis not present

## 2020-02-29 DIAGNOSIS — H409 Unspecified glaucoma: Secondary | ICD-10-CM | POA: Diagnosis not present

## 2020-02-29 DIAGNOSIS — M069 Rheumatoid arthritis, unspecified: Secondary | ICD-10-CM | POA: Diagnosis not present

## 2020-03-30 DIAGNOSIS — I429 Cardiomyopathy, unspecified: Secondary | ICD-10-CM | POA: Diagnosis not present

## 2020-03-30 DIAGNOSIS — M069 Rheumatoid arthritis, unspecified: Secondary | ICD-10-CM | POA: Diagnosis not present

## 2020-03-30 DIAGNOSIS — H409 Unspecified glaucoma: Secondary | ICD-10-CM | POA: Diagnosis not present

## 2020-03-30 DIAGNOSIS — I10 Essential (primary) hypertension: Secondary | ICD-10-CM | POA: Diagnosis not present

## 2020-03-30 DIAGNOSIS — J302 Other seasonal allergic rhinitis: Secondary | ICD-10-CM | POA: Diagnosis not present

## 2020-03-30 DIAGNOSIS — E782 Mixed hyperlipidemia: Secondary | ICD-10-CM | POA: Diagnosis not present

## 2020-03-30 DIAGNOSIS — M25512 Pain in left shoulder: Secondary | ICD-10-CM | POA: Diagnosis not present

## 2020-03-30 DIAGNOSIS — S46101D Unspecified injury of muscle, fascia and tendon of long head of biceps, right arm, subsequent encounter: Secondary | ICD-10-CM | POA: Diagnosis not present

## 2020-03-30 DIAGNOSIS — E1169 Type 2 diabetes mellitus with other specified complication: Secondary | ICD-10-CM | POA: Diagnosis not present

## 2020-04-29 ENCOUNTER — Ambulatory Visit: Payer: PPO | Admitting: Cardiovascular Disease

## 2020-04-29 ENCOUNTER — Other Ambulatory Visit: Payer: Self-pay

## 2020-04-29 ENCOUNTER — Encounter: Payer: Self-pay | Admitting: Cardiovascular Disease

## 2020-04-29 VITALS — BP 118/60 | HR 61 | Ht 69.0 in | Wt 151.2 lb

## 2020-04-29 DIAGNOSIS — E1169 Type 2 diabetes mellitus with other specified complication: Secondary | ICD-10-CM | POA: Diagnosis not present

## 2020-04-29 DIAGNOSIS — I428 Other cardiomyopathies: Secondary | ICD-10-CM

## 2020-04-29 DIAGNOSIS — H409 Unspecified glaucoma: Secondary | ICD-10-CM | POA: Diagnosis not present

## 2020-04-29 DIAGNOSIS — S46101D Unspecified injury of muscle, fascia and tendon of long head of biceps, right arm, subsequent encounter: Secondary | ICD-10-CM | POA: Diagnosis not present

## 2020-04-29 DIAGNOSIS — J302 Other seasonal allergic rhinitis: Secondary | ICD-10-CM | POA: Diagnosis not present

## 2020-04-29 DIAGNOSIS — I447 Left bundle-branch block, unspecified: Secondary | ICD-10-CM

## 2020-04-29 DIAGNOSIS — I429 Cardiomyopathy, unspecified: Secondary | ICD-10-CM | POA: Diagnosis not present

## 2020-04-29 DIAGNOSIS — I10 Essential (primary) hypertension: Secondary | ICD-10-CM | POA: Diagnosis not present

## 2020-04-29 DIAGNOSIS — M25512 Pain in left shoulder: Secondary | ICD-10-CM | POA: Diagnosis not present

## 2020-04-29 DIAGNOSIS — E782 Mixed hyperlipidemia: Secondary | ICD-10-CM | POA: Diagnosis not present

## 2020-04-29 DIAGNOSIS — M069 Rheumatoid arthritis, unspecified: Secondary | ICD-10-CM | POA: Diagnosis not present

## 2020-04-29 NOTE — Patient Instructions (Signed)

## 2020-04-29 NOTE — Progress Notes (Signed)
Cardiology Office Note    Date:  04/29/2020   ID:  Matthew Haik., DOB 1940/08/10, MRN [id number]  PCP:  Celene Squibb, MD  Cardiologist:   Sanda Klein, MD   No chief complaint on file.   History of Present Illness:  Matthew Naugle. is a 80 y.o. male with mild nonischemic cardiomyopathy, rate related left bundle branch block, hypertension returning for follow-up.   He is doing very well.  He walks 3-5 miles 3 days a week, exercises for at least 30 minutes on a stationary bike other days of the week and lifts some light weights.  He has received his COVID-19 vaccinations and has managed to not get sick over the last couple of years.  Continues have some arthralgias, but for the most part treatment with methotrexate is controlling his symptoms of rheumatoid arthritis.  He has an excellent lipid profile.  He feels well and has no cardiovascular complaints.  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  He has not been able to go to the [facility] due to the coronavirus pandemic, but he still walks around his neighborhood.  He has mildly depressed left ventricular systolic function with an ejection fraction of 45-50% (last echo reviewed 2011) and is taking both ace inhibitors and beta blockers.  He had a normal nuclear treadmill stress test in 2010, exercised to 13 mets.  Years ago, his LBBB was rate related, but now is present even at slow heart rates.  Past Medical History:  Diagnosis Date  . Arthritis    RA  . Colon polyps   . Depression   . Dyslipidemia   . Hypertension   . LBBB (left bundle branch block)    Which comes and goes.  Marland Kitchen NICM (nonischemic cardiomyopathy) ([place])    Echo 09/22/09, EF 45-50%  . Rotator cuff tear   . Shortness of breath    exertion    Past Surgical History:  Procedure Laterality Date  . BACK SURGERY    . CARDIAC CATHETERIZATION  09/22/09   Mild non-obstructive CAD  . CARDIOVASCULAR STRESS TEST  06/23/2008   Exercise:  normal perfusion. Low  risk.  . COLONOSCOPY  10/12/2011   Procedure: COLONOSCOPY;  Surgeon: Rogene Houston, MD;  [facility];  Service: Endoscopy;  Laterality: N/A;  930  . COLONOSCOPY N/A 11/10/2016   Procedure: COLONOSCOPY;  Surgeon: Rogene Houston, MD;  [facility];  Service: Endoscopy;  Laterality: N/A;  930  . EYE SURGERY     CATARACTS ; RIGHT EYE Jun 19, 2018, LEFT EYE JUNE 9TH, 2020 , SAW HIS EYE DOCTOR AFTER THE JUNE 9TH PROCEDURE  . LEFT FOOT SURGERY   2015  . POLYPECTOMY  11/10/2016   Procedure: POLYPECTOMY;  Surgeon: Rogene Houston, MD;  [facility];  Service: Endoscopy;;  colon  . SHOULDER OPEN ROTATOR CUFF REPAIR Left 07/25/2018   Procedure: Left shoulder rotator cuff repair with graft;  Surgeon: Latanya Maudlin, MD;  [facility]: [facility];  Service: Orthopedics;  Laterality: Left;  5min  . TRANSTHORACIC ECHOCARDIOGRAM  09/22/09   EF 45-50%.  No significant valvular disease.     Current Medications: Outpatient Medications Prior to Visit  Medication Sig Dispense Refill  . amLODipine (NORVASC) 10 MG tablet Take 10 mg by mouth daily.     Marland Kitchen aspirin EC 81 MG tablet Take 1 tablet (81 mg total) by mouth daily.    . carvedilol (COREG) 6.25 MG tablet  Take 1 tablet (6.25 mg total) by mouth 2 (two) times daily. 60 tablet 11  . dorzolamide-timolol (COSOPT) 22.3-6.8 MG/ML ophthalmic solution Place 1 drop into both eyes 2 (two) times daily.    . folic acid (FOLVITE) 1 MG tablet Take 1 mg by mouth daily.    Marland Kitchen latanoprost (XALATAN) 0.005 % ophthalmic solution Place 1 drop into both eyes at bedtime.     Marland Kitchen lisinopril (PRINIVIL,ZESTRIL) 40 MG tablet Take 40 mg by mouth daily.    . methotrexate (RHEUMATREX) 2.5 MG tablet Take 20 mg by mouth once a week.     . multivitamin (ONE-A-DAY MEN'S) TABS tablet Take 1 tablet by mouth daily.    . Omega-3 350 MG CPDR Take 350 mg by mouth daily.    . predniSONE (DELTASONE) 5 MG tablet Take 5 mg by mouth 2 (two) times daily with a meal.     No  facility-administered medications prior to visit.     Allergies:   Patient has no known allergies.   Social History   Socioeconomic History  . Marital status: Married    Spouse name: Not on file  . Number of children: Not on file  . Years of education: Not on file  . Highest education level: Not on file  Occupational History  . Not on file  Tobacco Use  . Smoking status: Former Smoker    Years: 15.00  . Smokeless tobacco: Never Used  Vaping Use  . Vaping Use: Never used  Substance and Sexual Activity  . Alcohol use: No  . Drug use: No  . Sexual activity: Not on file  Other Topics Concern  . Not on file  Social History Narrative  . Not on file   Social Determinants of Health   Financial Resource Strain: Not on file  Food Insecurity: Not on file  Transportation Needs: Not on file  Physical Activity: Not on file  Stress: Not on file  Social Connections: Not on file      ROS:   Please see the history of present illness.    ROS All other systems are reviewed and are negative.   PHYSICAL EXAM:   VS:  BP 118/60   Pulse 61   Ht 5\' 9"  (1.753 m)   Wt 151 lb 3.2 oz (68.6 kg)   BMI 22.33 kg/m      General: Alert, oriented x3, no distress, appears lean and fit Head: no evidence of trauma, PERRL, EOMI, no exophtalmos or lid lag, no myxedema, no xanthelasma; normal ears, nose and oropharynx Neck: normal jugular venous pulsations and no hepatojugular reflux; brisk carotid pulses without delay and no carotid bruits Chest: clear to auscultation, no signs of consolidation by percussion or palpation, normal fremitus, symmetrical and full respiratory excursions Cardiovascular: normal position and quality of the apical impulse, regular rhythm, normal first and paradoxically split second heart sounds, no murmurs, rubs or gallops Abdomen: no tenderness or distention, no masses by palpation, no abnormal pulsatility or arterial bruits, normal bowel sounds, no  hepatosplenomegaly Extremities: no clubbing, cyanosis or edema; 2+ radial, ulnar and brachial pulses bilaterally; 2+ right femoral, posterior tibial and dorsalis pedis pulses; 2+ left femoral, posterior tibial and dorsalis pedis pulses; no subclavian or femoral bruits Neurological: grossly nonfocal Psych: Normal mood and affect    Wt Readings from Last 3 Encounters:  04/29/20 151 lb 3.2 oz (68.6 kg)  11/12/19 154 lb (69.9 kg)  03/13/19 157 lb 6.4 oz (71.4 kg)    Studies/Labs Reviewed:  EKG:  EKG is ordered today.  It is very similar to previous tracings and shows sinus rhythm with borderline first-degree AV block (PR 200 ms) and a broad left bundle branch block (QRS 144 ms), QTC 465 ms.  Recent Labs: 11/12/2019: BUN 15; Creatinine, Ser 1.01; Hemoglobin 14.0; Platelets 207; Potassium 3.9; Sodium 138   Lipid Panel    Component Value Date/Time   CHOL  09/23/2009 0406    109        ATP III CLASSIFICATION:  <200     mg/dL   Desirable  200-239  mg/dL   Borderline High  >=240    mg/dL   High          TRIG 28 09/23/2009 0406   HDL 45 09/23/2009 0406   CHOLHDL 2.4 09/23/2009 0406   VLDL 6 09/23/2009 0406   LDLCALC  09/23/2009 0406    58        Total Cholesterol/HDL:CHD Risk Coronary Heart Disease Risk Table                     Men   Women  1/2 Average Risk   3.4   3.3  Average Risk       5.0   4.4  2 X Average Risk   9.6   7.1  3 X Average Risk  23.4   11.0        Use the calculated Patient Ratio above and the CHD Risk Table to determine the patient's CHD Risk.        ATP III CLASSIFICATION (LDL):  <100     mg/dL   Optimal  100-129  mg/dL   Near or Above                    Optimal  130-159  mg/dL   Borderline  160-189  mg/dL   High  >190     mg/dL   Very High   12/31/2018 total cholesterol 135, HDL 60, triglycerides 47  01/20/2020 total cholesterol 139, HDL 60, triglycerides 73  ASSESSMENT:    1. NICM (nonischemic cardiomyopathy): last echo 09/22/09.  EF 45-50%    2. Essential hypertension   3. LBBB (left bundle branch block)      PLAN:  In order of problems listed above:  1. CMP: Minimal reduction in overall left ventricular systolic function can be attributed to the LBBB related dyssynchrony.  No symptoms of heart failure.  On beta-blocker and ACE inhibitor.  Does not require diuretics.  Clinically euvolemic, NYHA functional class I. 2. HTN: Well-controlled.  He is lost a little weight.  If blood pressure becomes too low, I would first stop the amlodipine. 3. LBBB: This was initially a rate related abnormality but is now seen at slow heart rates and he has an accompanying prolonged PR interval.  Currently no signs or symptoms to suggest high-grade AV block or need for pacemaker.  Asked him to call for episodes of unexplained dizziness or syncope. 4. All lipid parameters are excellent.  Medication Adjustments/Labs and Tests Ordered: Current medicines are reviewed at length with the patient today.  Concerns regarding medicines are outlined above.  Medication changes, Labs and Tests ordered today are listed in the Patient Instructions below. Patient Instructions  Medication Instructions:  No changes *If you need a refill on your cardiac medications before your next appointment, please call your pharmacy*   Lab Work: None ordered If you have labs (blood work) drawn today and your  tests are completely normal, you will receive your results only by: Marland Kitchen MyChart Message (if you have MyChart) OR . A paper copy in the mail If you have any lab test that is abnormal or we need to change your treatment, we will call you to review the results.   Testing/Procedures: None ordered   Follow-Up: At [facility], you and your health needs are our priority.  As part of our continuing mission to provide you with exceptional heart care, we have created designated Provider Care Teams.  These Care Teams include your primary Cardiologist (physician) and Advanced  Practice Providers (APPs -  Physician Assistants and Nurse Practitioners) who all work together to provide you with the care you need, when you need it.  We recommend signing up for the patient portal called "MyChart".  Sign up information is provided on this After Visit Summary.  MyChart is used to connect with patients for Virtual Visits (Telemedicine).  Patients are able to view lab/test results, encounter notes, upcoming appointments, etc.  Non-urgent messages can be sent to your provider as well.   To learn more about what you can do with MyChart, go to [email or web address].    Your next appointment:   12 month(s)  The format for your next appointment:   In Person  Provider:   You may see Sanda Klein, MD or one of the following Advanced Practice Providers on your designated Care Team:    Almyra Deforest, PA-C  Fabian Sharp, [place] or   Roby Lofts, PA-C      Signed, Sanda Klein, MD  04/29/2020 9:42 AM    [facility] [address], [place], [place]  [address] Phone: [phone]; Fax: [phone]

## 2020-05-18 DIAGNOSIS — E875 Hyperkalemia: Secondary | ICD-10-CM | POA: Diagnosis not present

## 2020-05-18 DIAGNOSIS — E1169 Type 2 diabetes mellitus with other specified complication: Secondary | ICD-10-CM | POA: Diagnosis not present

## 2020-05-18 DIAGNOSIS — E782 Mixed hyperlipidemia: Secondary | ICD-10-CM | POA: Diagnosis not present

## 2020-05-18 DIAGNOSIS — M069 Rheumatoid arthritis, unspecified: Secondary | ICD-10-CM | POA: Diagnosis not present

## 2020-05-18 DIAGNOSIS — I10 Essential (primary) hypertension: Secondary | ICD-10-CM | POA: Diagnosis not present

## 2020-05-21 DIAGNOSIS — J302 Other seasonal allergic rhinitis: Secondary | ICD-10-CM | POA: Diagnosis not present

## 2020-05-21 DIAGNOSIS — S46101D Unspecified injury of muscle, fascia and tendon of long head of biceps, right arm, subsequent encounter: Secondary | ICD-10-CM | POA: Diagnosis not present

## 2020-05-21 DIAGNOSIS — F5221 Male erectile disorder: Secondary | ICD-10-CM | POA: Diagnosis not present

## 2020-05-21 DIAGNOSIS — E1169 Type 2 diabetes mellitus with other specified complication: Secondary | ICD-10-CM | POA: Diagnosis not present

## 2020-05-21 DIAGNOSIS — M069 Rheumatoid arthritis, unspecified: Secondary | ICD-10-CM | POA: Diagnosis not present

## 2020-05-21 DIAGNOSIS — H409 Unspecified glaucoma: Secondary | ICD-10-CM | POA: Diagnosis not present

## 2020-05-21 DIAGNOSIS — I10 Essential (primary) hypertension: Secondary | ICD-10-CM | POA: Diagnosis not present

## 2020-05-21 DIAGNOSIS — M25512 Pain in left shoulder: Secondary | ICD-10-CM | POA: Diagnosis not present

## 2020-05-21 DIAGNOSIS — I429 Cardiomyopathy, unspecified: Secondary | ICD-10-CM | POA: Diagnosis not present

## 2020-05-21 DIAGNOSIS — E782 Mixed hyperlipidemia: Secondary | ICD-10-CM | POA: Diagnosis not present

## 2020-05-21 DIAGNOSIS — R519 Headache, unspecified: Secondary | ICD-10-CM | POA: Diagnosis not present

## 2020-06-24 DIAGNOSIS — I2510 Atherosclerotic heart disease of native coronary artery without angina pectoris: Secondary | ICD-10-CM | POA: Diagnosis not present

## 2020-06-24 DIAGNOSIS — I10 Essential (primary) hypertension: Secondary | ICD-10-CM | POA: Diagnosis not present

## 2020-07-30 DIAGNOSIS — I10 Essential (primary) hypertension: Secondary | ICD-10-CM | POA: Diagnosis not present

## 2020-07-30 DIAGNOSIS — I2510 Atherosclerotic heart disease of native coronary artery without angina pectoris: Secondary | ICD-10-CM | POA: Diagnosis not present

## 2020-08-13 DIAGNOSIS — H40033 Anatomical narrow angle, bilateral: Secondary | ICD-10-CM | POA: Diagnosis not present

## 2020-08-19 DIAGNOSIS — E782 Mixed hyperlipidemia: Secondary | ICD-10-CM | POA: Diagnosis not present

## 2020-08-19 DIAGNOSIS — R7303 Prediabetes: Secondary | ICD-10-CM | POA: Diagnosis not present

## 2020-08-27 DIAGNOSIS — M069 Rheumatoid arthritis, unspecified: Secondary | ICD-10-CM | POA: Diagnosis not present

## 2020-08-27 DIAGNOSIS — E1169 Type 2 diabetes mellitus with other specified complication: Secondary | ICD-10-CM | POA: Diagnosis not present

## 2020-08-27 DIAGNOSIS — I10 Essential (primary) hypertension: Secondary | ICD-10-CM | POA: Diagnosis not present

## 2020-08-27 DIAGNOSIS — M25512 Pain in left shoulder: Secondary | ICD-10-CM | POA: Diagnosis not present

## 2020-08-27 DIAGNOSIS — J309 Allergic rhinitis, unspecified: Secondary | ICD-10-CM | POA: Diagnosis not present

## 2020-08-27 DIAGNOSIS — Z23 Encounter for immunization: Secondary | ICD-10-CM | POA: Diagnosis not present

## 2020-08-27 DIAGNOSIS — I420 Dilated cardiomyopathy: Secondary | ICD-10-CM | POA: Diagnosis not present

## 2020-08-27 DIAGNOSIS — H409 Unspecified glaucoma: Secondary | ICD-10-CM | POA: Diagnosis not present

## 2020-08-27 DIAGNOSIS — E782 Mixed hyperlipidemia: Secondary | ICD-10-CM | POA: Diagnosis not present

## 2020-08-30 DIAGNOSIS — I2510 Atherosclerotic heart disease of native coronary artery without angina pectoris: Secondary | ICD-10-CM | POA: Diagnosis not present

## 2020-08-30 DIAGNOSIS — I10 Essential (primary) hypertension: Secondary | ICD-10-CM | POA: Diagnosis not present

## 2020-09-30 DIAGNOSIS — I10 Essential (primary) hypertension: Secondary | ICD-10-CM | POA: Diagnosis not present

## 2020-09-30 DIAGNOSIS — I2510 Atherosclerotic heart disease of native coronary artery without angina pectoris: Secondary | ICD-10-CM | POA: Diagnosis not present

## 2020-10-27 DIAGNOSIS — Z23 Encounter for immunization: Secondary | ICD-10-CM | POA: Diagnosis not present

## 2020-10-30 DIAGNOSIS — I2510 Atherosclerotic heart disease of native coronary artery without angina pectoris: Secondary | ICD-10-CM | POA: Diagnosis not present

## 2020-10-30 DIAGNOSIS — I10 Essential (primary) hypertension: Secondary | ICD-10-CM | POA: Diagnosis not present

## 2020-12-30 DIAGNOSIS — E782 Mixed hyperlipidemia: Secondary | ICD-10-CM | POA: Diagnosis not present

## 2020-12-30 DIAGNOSIS — I10 Essential (primary) hypertension: Secondary | ICD-10-CM | POA: Diagnosis not present

## 2020-12-31 DIAGNOSIS — E782 Mixed hyperlipidemia: Secondary | ICD-10-CM | POA: Diagnosis not present

## 2020-12-31 DIAGNOSIS — E1169 Type 2 diabetes mellitus with other specified complication: Secondary | ICD-10-CM | POA: Diagnosis not present

## 2021-01-05 DIAGNOSIS — H409 Unspecified glaucoma: Secondary | ICD-10-CM | POA: Diagnosis not present

## 2021-01-05 DIAGNOSIS — I10 Essential (primary) hypertension: Secondary | ICD-10-CM | POA: Diagnosis not present

## 2021-01-05 DIAGNOSIS — E1169 Type 2 diabetes mellitus with other specified complication: Secondary | ICD-10-CM | POA: Diagnosis not present

## 2021-01-05 DIAGNOSIS — M069 Rheumatoid arthritis, unspecified: Secondary | ICD-10-CM | POA: Diagnosis not present

## 2021-01-05 DIAGNOSIS — J309 Allergic rhinitis, unspecified: Secondary | ICD-10-CM | POA: Diagnosis not present

## 2021-01-05 DIAGNOSIS — Z0001 Encounter for general adult medical examination with abnormal findings: Secondary | ICD-10-CM | POA: Diagnosis not present

## 2021-01-05 DIAGNOSIS — I420 Dilated cardiomyopathy: Secondary | ICD-10-CM | POA: Diagnosis not present

## 2021-01-05 DIAGNOSIS — E782 Mixed hyperlipidemia: Secondary | ICD-10-CM | POA: Diagnosis not present

## 2021-01-05 DIAGNOSIS — M25512 Pain in left shoulder: Secondary | ICD-10-CM | POA: Diagnosis not present

## 2021-01-29 DIAGNOSIS — E782 Mixed hyperlipidemia: Secondary | ICD-10-CM | POA: Diagnosis not present

## 2021-01-29 DIAGNOSIS — I10 Essential (primary) hypertension: Secondary | ICD-10-CM | POA: Diagnosis not present

## 2021-02-16 ENCOUNTER — Ambulatory Visit
Admission: EM | Admit: 2021-02-16 | Discharge: 2021-02-16 | Disposition: A | Discharge status: Home / Self Care | Payer: PPO | Attending: Student | Admitting: Student

## 2021-02-16 ENCOUNTER — Other Ambulatory Visit: Payer: Self-pay

## 2021-02-16 ENCOUNTER — Ambulatory Visit (INDEPENDENT_AMBULATORY_CARE_PROVIDER_SITE_OTHER): Payer: PPO

## 2021-02-16 ENCOUNTER — Encounter: Payer: Self-pay | Admitting: Emergency Medicine

## 2021-02-16 DIAGNOSIS — Z9189 Other specified personal risk factors, not elsewhere classified: Secondary | ICD-10-CM

## 2021-02-16 DIAGNOSIS — R0989 Other specified symptoms and signs involving the circulatory and respiratory systems: Secondary | ICD-10-CM

## 2021-02-16 DIAGNOSIS — R059 Cough, unspecified: Secondary | ICD-10-CM

## 2021-02-16 DIAGNOSIS — J069 Acute upper respiratory infection, unspecified: Secondary | ICD-10-CM | POA: Diagnosis not present

## 2021-02-16 MED ORDER — PREDNISONE 20 MG PO TABS: 40.0000 mg | ORAL_TABLET | Freq: Every day | ORAL | 0 refills | Status: AC

## 2021-02-16 MED ORDER — ALBUTEROL SULFATE HFA 108 (90 BASE) MCG/ACT IN AERS: 1.0000 | INHALATION_SPRAY | Freq: Four times a day (QID) | RESPIRATORY_TRACT | 0 refills | Status: DC | PRN

## 2021-02-16 NOTE — Discharge Instructions (Addendum)
-  Your chest x-ray looks good.  You do not have a bacterial infection today. -Prednisone, 2 pills taken at the same time for 5 days in a row.  Try taking this earlier in the day as it can give you energy. Avoid NSAIDs like ibuprofen and alleve while taking this medication as they can increase your risk of stomach upset and even GI bleeding when in combination with a steroid. You can continue tylenol (acetaminophen) up to 1000mg  3x daily. -Albuterol inhaler as needed for cough, wheezing, shortness of breath, 1 to 2 puffs every 6 hours as needed. -With a virus, you're typically contagious for 5-7 days, or as long as you're having fevers.  -Follow-up if symptoms worsen or persist, including new shortness of breath, fevers and chills getting higher, chest pain, dizziness.

## 2021-02-16 NOTE — ED Provider Notes (Signed)
[facility]    CSN: [id number] Arrival date & time: 02/16/21  0851      History   Chief Complaint No chief complaint on file.   HPI Matthew Yan. is a 81 y.o. male presenting with cough, congestion for 5 days following COVID booster.  Medical history hyperlipidemia, left bundle branch block, nonischemic cardiomyopathy, former smoker, SOB on exertion (possibly fibrosis per pt - no formal dx). Cough is nonproductive - dry and hacking, denies SOB, CP, dizziness, weakness. States he is not hydrating adequately. Hasn't monitored temperature at home. Denies sick contacts.   HPI  Past Medical History:  Diagnosis Date   Arthritis    RA   Colon polyps    Depression    Dyslipidemia    Hypertension    LBBB (left bundle branch block)    Which comes and goes.   NICM (nonischemic cardiomyopathy) ([place])    Echo 09/22/09, EF 45-50%   Rotator cuff tear    Shortness of breath    exertion    Patient Active Problem List   Diagnosis Date Noted   Partial nontraumatic tear of rotator cuff 07/25/2018   Hypercholesterolemia 02/02/2018   Hx of colonic polyps 10/05/2016   Essential hypertension 10/03/2012   NICM (nonischemic cardiomyopathy): last echo 09/22/09.  EF 45-50% 09/27/2012   Dyslipidemia 09/27/2012   LBBB (left bundle branch block),  Intermitent  09/27/2012   Pain in joint, shoulder region 02/25/2008   IMPINGEMENT SYNDROME 02/25/2008   RUPTURE ROTATOR CUFF 02/25/2008    Past Surgical History:  Procedure Laterality Date   BACK SURGERY     CARDIAC CATHETERIZATION  09/22/09   Mild non-obstructive CAD   CARDIOVASCULAR STRESS TEST  06/23/2008   Exercise:  normal perfusion. Low risk.   COLONOSCOPY  10/12/2011   Procedure: COLONOSCOPY;  Surgeon: Rogene Houston, MD;  [facility];  Service: Endoscopy;  Laterality: N/A;  930   COLONOSCOPY N/A 11/10/2016   Procedure: COLONOSCOPY;  Surgeon: Rogene Houston, MD;  [facility];  Service: Endoscopy;   Laterality: N/A;  [place] ; RIGHT EYE Jun 19, 2018, LEFT EYE JUNE 9TH, 2020 , SAW HIS EYE DOCTOR AFTER THE JUNE 9TH PROCEDURE   LEFT FOOT SURGERY   2015   POLYPECTOMY  11/10/2016   Procedure: POLYPECTOMY;  Surgeon: Rogene Houston, MD;  [facility];  Service: Endoscopy;;  colon   SHOULDER OPEN ROTATOR CUFF REPAIR Left 07/25/2018   Procedure: Left shoulder rotator cuff repair with graft;  Surgeon: Latanya Maudlin, MD;  Location: [facility];  Service: Orthopedics;  Laterality: Left;  56min   TRANSTHORACIC ECHOCARDIOGRAM  09/22/09   EF 45-50%.  No significant valvular disease.        Home Medications    Prior to Admission medications   Medication Sig Start Date End Date Taking? Authorizing Provider  albuterol (VENTOLIN HFA) 108 (90 Base) MCG/ACT inhaler Inhale 1-2 puffs into the lungs every 6 (six) hours as needed for wheezing or shortness of breath. 02/16/21  Yes Hazel Sams, PA-C  predniSONE (DELTASONE) 20 MG tablet Take 2 tablets (40 mg total) by mouth daily for 5 days. Take with breakfast or lunch. Avoid NSAIDs (ibuprofen, etc) while taking this medication. 02/16/21 02/21/21 Yes Hazel Sams, PA-C  amLODipine (NORVASC) 10 MG tablet Take 10 mg by mouth daily.  10/25/16   [provider]  aspirin EC 81 MG tablet Take 1 tablet (81  mg total) by mouth daily. 11/11/16   Rehman, Mechele Dawley, MD  carvedilol (COREG) 6.25 MG tablet Take 1 tablet (6.25 mg total) by mouth 2 (two) times daily. 10/30/15   Croitoru, Mihai, MD  dorzolamide-timolol (COSOPT) 22.3-6.8 MG/ML ophthalmic solution Place 1 drop into both eyes 2 (two) times daily.    [provider]  folic acid (FOLVITE) 1 MG tablet Take 1 mg by mouth daily. 07/24/13   [provider]  latanoprost (XALATAN) 0.005 % ophthalmic solution Place 1 drop into both eyes at bedtime.  10/28/15   [provider]  lisinopril (PRINIVIL,ZESTRIL) 40 MG tablet Take 40 mg by mouth daily.    [provider]  methotrexate (RHEUMATREX) 2.5 MG tablet Take 20 mg by mouth once a week.  09/27/13   [provider]  multivitamin (ONE-A-DAY MEN'S) TABS tablet Take 1 tablet by mouth daily.    [provider]    Family History Family History  Problem Relation Age of Onset   Colon cancer Neg Hx     Social History Social History   Tobacco Use   Smoking status: Former    Years: 15.00    Types: Cigarettes   Smokeless tobacco: Never  Vaping Use   Vaping Use: Never used  Substance Use Topics   Alcohol use: No   Drug use: No     Allergies   Patient has no known allergies.   Review of Systems Review of Systems  Constitutional:  Negative for appetite change, chills and fever.  HENT:  Positive for congestion. Negative for ear pain, rhinorrhea, sinus pressure, sinus pain and sore throat.   Eyes:  Negative for redness and visual disturbance.  Respiratory:  Positive for cough. Negative for chest tightness, shortness of breath and wheezing.   Cardiovascular:  Negative for chest pain and palpitations.  Gastrointestinal:  Negative for abdominal pain, constipation, diarrhea, nausea and vomiting.  Genitourinary:  Negative for dysuria, frequency and urgency.  Musculoskeletal:  Negative for myalgias.  Neurological:  Negative for dizziness, weakness and headaches.  Psychiatric/Behavioral:  Negative for confusion.   All other systems reviewed and are negative.   Physical Exam Triage Vital Signs ED Triage Vitals  Enc Vitals Group     BP 02/16/21 0923 114/64     Pulse Rate 02/16/21 0923 94     Resp 02/16/21 0923 16     Temp 02/16/21 0923 100.3 F (37.9 C)     Temp Source 02/16/21 0923 Oral     SpO2 02/16/21 0923 97 %     Weight --      Height --      Head Circumference --      Peak Flow --      Pain Score 02/16/21 0924 0     Pain Loc --      Pain Edu? --      Excl. in [place]? --    No data found.  Updated Vital Signs BP 114/64 (BP Location: Right Arm)     Pulse 94    Temp 100.3 F (37.9 C) (Oral)    Resp 16    SpO2 97%   Visual Acuity Right Eye Distance:   Left Eye Distance:   Bilateral Distance:    Right Eye Near:   Left Eye Near:    Bilateral Near:     Physical Exam Vitals reviewed.  Constitutional:      General: He is not in acute distress.    Appearance: Normal appearance. He is  ill-appearing.  HENT:     Head: Normocephalic and atraumatic.     Right Ear: Tympanic membrane, ear canal and external ear normal. No tenderness. No middle ear effusion. There is no impacted cerumen. Tympanic membrane is not perforated, erythematous, retracted or bulging.     Left Ear: Tympanic membrane, ear canal and external ear normal. No tenderness.  No middle ear effusion. There is no impacted cerumen. Tympanic membrane is not perforated, erythematous, retracted or bulging.     Nose: Nose normal. No congestion.     Mouth/Throat:     Mouth: Mucous membranes are moist.     Pharynx: Uvula midline. No oropharyngeal exudate or posterior oropharyngeal erythema.  Eyes:     Extraocular Movements: Extraocular movements intact.     Pupils: Pupils are equal, round, and reactive to light.  Cardiovascular:     Rate and Rhythm: Normal rate and regular rhythm.     Heart sounds: Normal heart sounds.  Pulmonary:     Effort: Pulmonary effort is normal.     Breath sounds: Examination of the right-middle field reveals rhonchi. Examination of the left-middle field reveals rhonchi. Rhonchi present. No decreased breath sounds, wheezing or rales.  Abdominal:     Palpations: Abdomen is soft.     Tenderness: There is no abdominal tenderness. There is no guarding or rebound.  Lymphadenopathy:     Cervical: No cervical adenopathy.     Right cervical: No superficial cervical adenopathy.    Left cervical: No superficial cervical adenopathy.  Neurological:     General: No focal deficit present.     Mental Status: He is alert and oriented to person, place, and time.   Psychiatric:        Mood and Affect: Mood normal.        Behavior: Behavior normal.        Thought Content: Thought content normal.        Judgment: Judgment normal.     UC Treatments / Results  Labs (all labs ordered are listed, but only abnormal results are displayed) Labs Reviewed  COVID-19, FLU A+B NAA    EKG   Radiology DG Chest 2 View  Result Date: 02/16/2021 CLINICAL DATA:  Rhonchi RIGHT greater than LEFT, received third booster shot on Friday, head congestion, cough EXAM: CHEST - 2 VIEW COMPARISON:  11/12/2019 FINDINGS: Enlargement of cardiac silhouette. Mediastinal contours and pulmonary vascularity normal. Bibasilar accentuated interstitial markings question fibrosis versus scarring unchanged. No acute infiltrate, pleural effusion, or pneumothorax. Bones unremarkable. IMPRESSION: Bibasilar scarring versus fibrosis. No acute abnormalities. Enlargement of cardiac silhouette. Electronically Signed   By: Lavonia Dana M.D.   On: 02/16/2021 10:19    Procedures Procedures (including critical care time)  Medications Ordered in UC Medications - No data to display  Initial Impression / Assessment and Plan / UC Course  I have reviewed the triage vital signs and the nursing notes.  Pertinent labs & imaging results that were available during my care of the patient were reviewed by me and considered in my medical decision making (see chart for details).     This patient is a very pleasant 81 y.o. year old male presenting with viral URI x5 days. Borderline febrile and tachy. History pulmonary fibrosis per pt, does not use regular inhalers.   Covid booster 1/13, symptoms started same day. Covid and influenza PCR sent. He is out of antiviral window.  CXR - Bibasilar scarring versus fibrosis. No acute abnormalities. Enlargement of cardiac silhouette.  Will manage  with prednisone and albuterol inhaler as below.   ED return precautions discussed. Patient verbalizes understanding and  agreement.   Coding Level 4 for acute illness with systemic symptoms, and prescription drug management  Final Clinical Impressions(s) / UC Diagnoses   Final diagnoses:  At increased risk of exposure to COVID-19 virus  Viral URI with cough     Discharge Instructions      -Your chest x-ray looks good.  You do not have a bacterial infection today. -Prednisone, 2 pills taken at the same time for 5 days in a row.  Try taking this earlier in the day as it can give you energy. Avoid NSAIDs like ibuprofen and alleve while taking this medication as they can increase your risk of stomach upset and even GI bleeding when in combination with a steroid. You can continue tylenol (acetaminophen) up to 1000mg  3x daily. -Albuterol inhaler as needed for cough, wheezing, shortness of breath, 1 to 2 puffs every 6 hours as needed. -With a virus, you're typically contagious for 5-7 days, or as long as you're having fevers.  -Follow-up if symptoms worsen or persist, including new shortness of breath, fevers and chills getting higher, chest pain, dizziness.      ED Prescriptions     Medication Sig Dispense Auth. Provider   predniSONE (DELTASONE) 20 MG tablet Take 2 tablets (40 mg total) by mouth daily for 5 days. Take with breakfast or lunch. Avoid NSAIDs (ibuprofen, etc) while taking this medication. 10 tablet Hazel Sams, PA-C   albuterol (VENTOLIN HFA) 108 (90 Base) MCG/ACT inhaler Inhale 1-2 puffs into the lungs every 6 (six) hours as needed for wheezing or shortness of breath. 1 each Hazel Sams, PA-C      PDMP not reviewed this encounter.   Hazel Sams, PA-C 02/16/21 1028

## 2021-02-16 NOTE — ED Triage Notes (Signed)
Received 3rd booster shot on Friday.  Head congestion, cough, symptoms started on Friday night

## 2021-02-17 LAB — COVID-19, FLU A+B NAA
Influenza A, NAA: NOT DETECTED
Influenza B, NAA: NOT DETECTED
SARS-CoV-2, NAA: NOT DETECTED

## 2021-02-19 DIAGNOSIS — H40033 Anatomical narrow angle, bilateral: Secondary | ICD-10-CM | POA: Diagnosis not present

## 2021-02-23 DIAGNOSIS — R432 Parageusia: Secondary | ICD-10-CM | POA: Diagnosis not present

## 2021-02-23 DIAGNOSIS — J069 Acute upper respiratory infection, unspecified: Secondary | ICD-10-CM | POA: Diagnosis not present

## 2021-02-23 DIAGNOSIS — M6281 Muscle weakness (generalized): Secondary | ICD-10-CM | POA: Diagnosis not present

## 2021-02-23 DIAGNOSIS — J019 Acute sinusitis, unspecified: Secondary | ICD-10-CM | POA: Diagnosis not present

## 2021-03-30 DIAGNOSIS — E782 Mixed hyperlipidemia: Secondary | ICD-10-CM | POA: Diagnosis not present

## 2021-03-30 DIAGNOSIS — I10 Essential (primary) hypertension: Secondary | ICD-10-CM | POA: Diagnosis not present

## 2021-04-30 DIAGNOSIS — I10 Essential (primary) hypertension: Secondary | ICD-10-CM | POA: Diagnosis not present

## 2021-04-30 DIAGNOSIS — I2510 Atherosclerotic heart disease of native coronary artery without angina pectoris: Secondary | ICD-10-CM | POA: Diagnosis not present

## 2021-05-03 DIAGNOSIS — E1169 Type 2 diabetes mellitus with other specified complication: Secondary | ICD-10-CM | POA: Diagnosis not present

## 2021-05-03 DIAGNOSIS — E782 Mixed hyperlipidemia: Secondary | ICD-10-CM | POA: Diagnosis not present

## 2021-05-06 DIAGNOSIS — I10 Essential (primary) hypertension: Secondary | ICD-10-CM | POA: Diagnosis not present

## 2021-05-06 DIAGNOSIS — M069 Rheumatoid arthritis, unspecified: Secondary | ICD-10-CM | POA: Diagnosis not present

## 2021-05-06 DIAGNOSIS — H409 Unspecified glaucoma: Secondary | ICD-10-CM | POA: Diagnosis not present

## 2021-05-06 DIAGNOSIS — M25512 Pain in left shoulder: Secondary | ICD-10-CM | POA: Diagnosis not present

## 2021-05-06 DIAGNOSIS — E782 Mixed hyperlipidemia: Secondary | ICD-10-CM | POA: Diagnosis not present

## 2021-05-06 DIAGNOSIS — I420 Dilated cardiomyopathy: Secondary | ICD-10-CM | POA: Diagnosis not present

## 2021-05-06 DIAGNOSIS — J84112 Idiopathic pulmonary fibrosis: Secondary | ICD-10-CM | POA: Diagnosis not present

## 2021-05-06 DIAGNOSIS — J309 Allergic rhinitis, unspecified: Secondary | ICD-10-CM | POA: Diagnosis not present

## 2021-05-06 DIAGNOSIS — E1169 Type 2 diabetes mellitus with other specified complication: Secondary | ICD-10-CM | POA: Diagnosis not present

## 2021-06-03 ENCOUNTER — Ambulatory Visit: Payer: PPO | Admitting: Cardiovascular Disease

## 2021-06-03 ENCOUNTER — Encounter: Payer: Self-pay | Admitting: Cardiovascular Disease

## 2021-06-03 VITALS — BP 134/64 | HR 58 | Ht 69.0 in | Wt 151.6 lb

## 2021-06-03 DIAGNOSIS — I10 Essential (primary) hypertension: Secondary | ICD-10-CM

## 2021-06-03 DIAGNOSIS — I447 Left bundle-branch block, unspecified: Secondary | ICD-10-CM | POA: Diagnosis not present

## 2021-06-03 DIAGNOSIS — I428 Other cardiomyopathies: Secondary | ICD-10-CM | POA: Diagnosis not present

## 2021-06-03 DIAGNOSIS — M069 Rheumatoid arthritis, unspecified: Secondary | ICD-10-CM | POA: Diagnosis not present

## 2021-06-03 DIAGNOSIS — E7800 Pure hypercholesterolemia, unspecified: Secondary | ICD-10-CM | POA: Diagnosis not present

## 2021-06-03 NOTE — Patient Instructions (Signed)

## 2021-06-03 NOTE — Progress Notes (Signed)
? ?Cardiology Office Note   ? ?Date:  06/06/2021  ? ?ID:  Matthew Loh., DOB [birth year], MRN [id number] ? ?PCP:  Celene Squibb, MD  ?Cardiologist:   Sanda Klein, MD  ? ?Chief Complaint  ?Patient presents with  ? Cardiomyopathy  ? ? ?History of Present Illness:  ?Matthew Weiss. is a 81 y.o. male with mild nonischemic cardiomyopathy, rate related left bundle branch block, hypertension returning for follow-up.  ? ?He's lost some weight, remains at a healthy BMI of 22.  Weight loss is now stabilized.  He continues to walk 3 to 4 miles a day and 45 to 60 minutes.  He does this 4 days a week and the other 3 days a week rides a stationary bike for 40 minutes.  Rheumatoid arthritis symptoms are mostly well controlled, but he does have finger stiffness and limited ability to contract his fingers.. ? ?The patient specifically denies any chest pain at rest exertion, dyspnea at rest or with exertion, orthopnea, paroxysmal nocturnal dyspnea, syncope, palpitations, focal neurological deficits, intermittent claudication, lower extremity edema, unexplained weight gain, cough, hemoptysis or wheezing. ? ?We have not reevaluated left ventricular systolic function since 2947. ? ?He has mildly depressed left ventricular systolic function with an ejection fraction of 45-50% (last echo reviewed 2011) and is taking both ace inhibitors and beta blockers.  He had a normal nuclear treadmill stress test in 2010, exercised to 13 mets.  Years ago, his LBBB was rate related, but now is present even at slow heart rates. ? ?Past Medical History:  ?Diagnosis Date  ? Arthritis   ? RA  ? Colon polyps   ? Depression   ? Dyslipidemia   ? Hypertension   ? LBBB (left bundle branch block)   ? Which comes and goes.  ? NICM (nonischemic cardiomyopathy) ([place])   ? Echo 09/22/09, EF 45-50%  ? Rotator cuff tear   ? Shortness of breath   ? exertion  ? ? ?Past Surgical History:  ?Procedure Laterality Date  ? BACK SURGERY    ? CARDIAC CATHETERIZATION  09/22/09   ? Mild non-obstructive CAD  ? CARDIOVASCULAR STRESS TEST  06/23/2008  ? Exercise:  normal perfusion. Low risk.  ? COLONOSCOPY  10/12/2011  ? Procedure: COLONOSCOPY;  Surgeon: Rogene Houston, MD;  Location: [facility];  Service: Endoscopy;  Laterality: N/A;  930  ? COLONOSCOPY N/A 11/10/2016  ? Procedure: COLONOSCOPY;  Surgeon: Rogene Houston, MD;  Location: [facility];  Service: Endoscopy;  Laterality: N/A;  930  ? EYE SURGERY    ? CATARACTS ; RIGHT EYE Jun 19, 2018, LEFT EYE JUNE 9TH, 2020 , SAW HIS EYE DOCTOR AFTER THE JUNE 9TH PROCEDURE  ? LEFT FOOT SURGERY   2015  ? POLYPECTOMY  11/10/2016  ? Procedure: POLYPECTOMY;  Surgeon: Rogene Houston, MD;  Location: [facility];  Service: Endoscopy;;  colon  ? SHOULDER OPEN ROTATOR CUFF REPAIR Left 07/25/2018  ? Procedure: Left shoulder rotator cuff repair with graft;  Surgeon: Latanya Maudlin, MD;  Location: [facility];  Service: Orthopedics;  Laterality: Left;  56mn  ? TRANSTHORACIC ECHOCARDIOGRAM  09/22/09  ? EF 45-50%.  No significant valvular disease.   ? ? ?Current Medications: ?Outpatient Medications Prior to Visit  ?Medication Sig Dispense Refill  ? albuterol (VENTOLIN HFA) 108 (90 Base) MCG/ACT inhaler Inhale 1-2 puffs into the lungs every 6 (six) hours as needed for wheezing or shortness of breath. 1 each 0  ? amLODipine (NORVASC)  10 MG tablet Take 10 mg by mouth daily.     ? aspirin EC 81 MG tablet Take 1 tablet (81 mg total) by mouth daily.    ? carvedilol (COREG) 6.25 MG tablet Take 1 tablet (6.25 mg total) by mouth 2 (two) times daily. 60 tablet 11  ? dorzolamide-timolol (COSOPT) 22.3-6.8 MG/ML ophthalmic solution Place 1 drop into both eyes 2 (two) times daily.    ? folic acid (FOLVITE) 1 MG tablet Take 1 mg by mouth daily.    ? latanoprost (XALATAN) 0.005 % ophthalmic solution Place 1 drop into both eyes at bedtime.     ? lisinopril (PRINIVIL,ZESTRIL) 40 MG tablet Take 40 mg by mouth daily.    ? methotrexate (RHEUMATREX) 2.5 MG tablet Take 20 mg by  mouth once a week.     ? multivitamin (ONE-A-DAY MEN'S) TABS tablet Take 1 tablet by mouth daily.    ? rosuvastatin (CRESTOR) 5 MG tablet Take 5 mg by mouth daily.    ? ?No facility-administered medications prior to visit.  ?  ? ?Allergies:   Patient has no known allergies.  ? ?Social History  ? ?Socioeconomic History  ? Marital status: Married  ?  Spouse name: Not on file  ? Number of children: Not on file  ? Years of education: Not on file  ? Highest education level: Not on file  ?Occupational History  ? Not on file  ?Tobacco Use  ? Smoking status: Former  ?  Years: 15.00  ?  Types: Cigarettes  ? Smokeless tobacco: Never  ?Vaping Use  ? Vaping Use: Never used  ?Substance and Sexual Activity  ? Alcohol use: No  ? Drug use: No  ? Sexual activity: Not on file  ?Other Topics Concern  ? Not on file  ?Social History Narrative  ? Not on file  ? ?Social Determinants of Health  ? ?Financial Resource Strain: Not on file  ?Food Insecurity: Not on file  ?Transportation Needs: Not on file  ?Physical Activity: Not on file  ?Stress: Not on file  ?Social Connections: Not on file  ?  ? ? ?ROS:   ?Please see the history of present illness.    ?ROS All other systems are reviewed and are negative. ? ? ?PHYSICAL EXAM:   ?VS:  BP 134/64   Pulse (!) 58   Ht '5\' 9"'$  (1.753 m)   Wt 151 lb 9.6 oz (68.8 kg)   SpO2 99%   BMI 22.39 kg/m?    ? ? ? ?General: Alert, oriented x3, no distress, appears well ?Head: no evidence of trauma, PERRL, EOMI, no exophtalmos or lid lag, no myxedema, no xanthelasma; normal ears, nose and oropharynx ?Neck: normal jugular venous pulsations and no hepatojugular reflux; brisk carotid pulses without delay and no carotid bruits ?Chest: clear to auscultation, no signs of consolidation by percussion or palpation, normal fremitus, symmetrical and full respiratory excursions ?Cardiovascular: normal position and quality of the apical impulse, regular rhythm, normal first and paradoxically split second heart sounds, no  murmurs, rubs or gallops ?Abdomen: no tenderness or distention, no masses by palpation, no abnormal pulsatility or arterial bruits, normal bowel sounds, no hepatosplenomegaly ?Extremities: no clubbing, cyanosis or edema; 2+ radial, ulnar and brachial pulses bilaterally; 2+ right femoral, posterior tibial and dorsalis pedis pulses; 2+ left femoral, posterior tibial and dorsalis pedis pulses; no subclavian or femoral bruits ?Neurological: grossly nonfocal ?Psych: Normal mood and affect ? ? ? ? ?Wt Readings from Last 3 Encounters:  ?06/03/21 151  lb 9.6 oz (68.8 kg)  ?04/29/20 151 lb 3.2 oz (68.6 kg)  ?11/12/19 154 lb (69.9 kg)  ?  ?Studies/Labs Reviewed:  ? ?EKG:  EKG is ordered today.  It is unchanged from previous tracings.  This showed sinus bradycardia and mild first-degree AV block (PR 224 ms) and LBBB with right axis deviation (QRS 148 ms), QTc 461 ms. ? ?Recent Labs: ?No results found for requested labs within last 8760 hours.  ?12/31/2020 hemoglobin 13.3, creatinine 0.9, potassium 3.9, ALT 13 ?Hemoglobin A1c 6.1% ?Lipid Panel ?   ?Component Value Date/Time  ? CHOL  09/23/2009 0406  ?  109        ?ATP III CLASSIFICATION: ? <200     mg/dL   Desirable ? 200-239  mg/dL   Borderline High ? >=240    mg/dL   High ?        ? TRIG 28 09/23/2009 0406  ? HDL 45 09/23/2009 0406  ? CHOLHDL 2.4 09/23/2009 0406  ? VLDL 6 09/23/2009 0406  ? [place]  09/23/2009 0406  ?  60        ?Total Cholesterol/HDL:CHD Risk ?Coronary Heart Disease Risk Table ?                    Men   Women ? 1/2 Average Risk   3.4   3.3 ? Average Risk       5.0   4.4 ? 2 X Average Risk   9.6   7.1 ? 3 X Average Risk  23.4   11.0 ?       ?Use the calculated Patient Ratio ?above and the CHD Risk Table ?to determine the patient's CHD Risk. ?       ?ATP III CLASSIFICATION (LDL): ? <100     mg/dL   Optimal ? 100-129  mg/dL   Near or Above ?                   Optimal ? 130-159  mg/dL   Borderline ? 160-189  mg/dL   High ? >190     mg/dL   Very High  ? ?12/31/2018  total cholesterol 135, HDL 60, triglycerides 47 ? ?01/20/2020 total cholesterol 139, HDL 60, triglycerides 73 ? ?12/31/2020 total cholesterol 124, HDL 55, LDL 68, triglycerides 49 ? ?ASSESSMENT:   ? ?1. N

## 2021-06-30 DIAGNOSIS — I10 Essential (primary) hypertension: Secondary | ICD-10-CM | POA: Diagnosis not present

## 2021-06-30 DIAGNOSIS — E1169 Type 2 diabetes mellitus with other specified complication: Secondary | ICD-10-CM | POA: Diagnosis not present

## 2021-06-30 DIAGNOSIS — E782 Mixed hyperlipidemia: Secondary | ICD-10-CM | POA: Diagnosis not present

## 2021-08-27 DIAGNOSIS — H40033 Anatomical narrow angle, bilateral: Secondary | ICD-10-CM | POA: Diagnosis not present

## 2021-08-27 DIAGNOSIS — Z961 Presence of intraocular lens: Secondary | ICD-10-CM | POA: Diagnosis not present

## 2021-10-01 DIAGNOSIS — E1169 Type 2 diabetes mellitus with other specified complication: Secondary | ICD-10-CM | POA: Diagnosis not present

## 2021-10-01 DIAGNOSIS — E782 Mixed hyperlipidemia: Secondary | ICD-10-CM | POA: Diagnosis not present

## 2021-10-06 DIAGNOSIS — E1169 Type 2 diabetes mellitus with other specified complication: Secondary | ICD-10-CM | POA: Diagnosis not present

## 2021-10-06 DIAGNOSIS — I420 Dilated cardiomyopathy: Secondary | ICD-10-CM | POA: Diagnosis not present

## 2021-10-06 DIAGNOSIS — M25512 Pain in left shoulder: Secondary | ICD-10-CM | POA: Diagnosis not present

## 2021-10-06 DIAGNOSIS — J84112 Idiopathic pulmonary fibrosis: Secondary | ICD-10-CM | POA: Diagnosis not present

## 2021-10-06 DIAGNOSIS — Z6821 Body mass index (BMI) 21.0-21.9, adult: Secondary | ICD-10-CM | POA: Diagnosis not present

## 2021-10-06 DIAGNOSIS — J309 Allergic rhinitis, unspecified: Secondary | ICD-10-CM | POA: Diagnosis not present

## 2021-10-06 DIAGNOSIS — I10 Essential (primary) hypertension: Secondary | ICD-10-CM | POA: Diagnosis not present

## 2021-10-06 DIAGNOSIS — Z23 Encounter for immunization: Secondary | ICD-10-CM | POA: Diagnosis not present

## 2021-10-06 DIAGNOSIS — H409 Unspecified glaucoma: Secondary | ICD-10-CM | POA: Diagnosis not present

## 2021-10-06 DIAGNOSIS — E782 Mixed hyperlipidemia: Secondary | ICD-10-CM | POA: Diagnosis not present

## 2021-10-06 DIAGNOSIS — M069 Rheumatoid arthritis, unspecified: Secondary | ICD-10-CM | POA: Diagnosis not present

## 2021-10-25 ENCOUNTER — Encounter (INDEPENDENT_AMBULATORY_CARE_PROVIDER_SITE_OTHER): Payer: Self-pay | Admitting: *Deleted

## 2021-11-02 ENCOUNTER — Encounter (INDEPENDENT_AMBULATORY_CARE_PROVIDER_SITE_OTHER): Payer: Self-pay

## 2021-11-02 ENCOUNTER — Other Ambulatory Visit (INDEPENDENT_AMBULATORY_CARE_PROVIDER_SITE_OTHER): Payer: Self-pay

## 2021-11-02 ENCOUNTER — Telehealth (INDEPENDENT_AMBULATORY_CARE_PROVIDER_SITE_OTHER): Payer: Self-pay

## 2021-11-02 DIAGNOSIS — Z86010 Personal history of colonic polyps: Secondary | ICD-10-CM

## 2021-11-02 MED ORDER — PEG 3350-KCL-NA BICARB-NACL 420 G PO SOLR: 4000.0000 mL | ORAL | 0 refills | Status: DC

## 2021-11-02 NOTE — Telephone Encounter (Signed)
Nazarene Bunning Ann Tynan Boesel, CMA  ?

## 2021-11-02 NOTE — Telephone Encounter (Signed)
Referring MD/PCP: Dr Wende Neighbors   Procedure: Tcs  Reason/Indication:  Hs of Polyps  Has patient had this procedure before?  Yes   If so, when, by whom and where?  11/10/2016  Is there a family history of colon cancer?  No   Who?  What age when diagnosed?    Is patient diabetic? If yes, Type 1 or Type 2   No       Does patient have prosthetic heart valve or mechanical valve?  No   Do you have a pacemaker/defibrillator?  No   Has patient ever had endocarditis/atrial fibrillation? No   Does patient use oxygen? No   Has patient had joint replacement within last 12 months?  No   Is patient constipated or do they take laxatives? No   Does patient have a history of alcohol/drug use?  No   Have you had a stroke/heart attack last 6 mths? No   Do you take medicine for weight loss?  No   For male patients,: have you had a hysterectomy n/a                      are you post menopausal n/a                      do you still have your menstrual cycle n/a  Is patient on blood thinner such as Coumadin, Plavix and/or Aspirin? yes  Medications: asa 81 mg daily, rosuvastatin 5 mg daily, lisinopril 40 mg daily, folic acid 1 mg daily, methotrexate 2.5 mg eight tablets once a week, carvedilol 6.25 1 in the am and 1 in the pm, amlodipine 10 mg daily, latanoprost one drop daily, dorzolamide one drop daily  Allergies: NKDA  Medication Adjustment per Dr Jenetta Downer none  Procedure date & time: 11/11/21 at 10:30 am

## 2021-11-08 ENCOUNTER — Encounter (HOSPITAL_COMMUNITY)
Admission: RE | Admit: 2021-11-08 | Discharge: 2021-11-08 | Disposition: A | Discharge status: Home / Self Care | Payer: PPO | Source: Ambulatory Visit | Attending: Gastroenterology | Admitting: Gastroenterology

## 2021-11-08 NOTE — Patient Instructions (Signed)
Matthew Weiss.  11/08/2021     '@PREFPERIOPPHARMACY'$ @   Your procedure is scheduled on 11/11/2021.  Report to [facility] at 8:30 A.M.  Call this number if you have problems the morning of surgery:  [phone]   Remember:  Please follow the diet and prep instructions given to you by Dr Colman Cater office.   Take these medicines the morning of surgery with A SIP OF WATER : Amlodipine Carvedilol    Do not wear jewelry, make-up or nail polish.  Do not wear lotions, powders, or perfumes, or deodorant.  Do not shave 48 hours prior to surgery.  Men may shave face and neck.  Do not bring valuables to the hospital.  [facility] is not responsible for any belongings or valuables.  Contacts, dentures or bridgework may not be worn into surgery.  Leave your suitcase in the car.  After surgery it may be brought to your room.  For patients admitted to the hospital, discharge time will be determined by your treatment team.  Patients discharged the day of surgery will not be allowed to drive home.   Name and phone number of your driver:   family Special instructions:  n/a  Please read over the following fact sheets that you were given. Care and Recovery After Surgery  Colonoscopy, Adult A colonoscopy is a procedure to look at the entire large intestine. This procedure is done using a long, thin, flexible tube that has a camera on the end. You may have a colonoscopy: As a part of normal colorectal screening. If you have certain symptoms, such as: A low number of red blood cells in your blood (anemia). Diarrhea that does not go away. Pain in your abdomen. Blood in your stool. A colonoscopy can help screen for and diagnose medical problems, including: An abnormal growth of cells or tissue (tumor). Abnormal growths within the lining of your intestine (polyps). Inflammation. Areas of bleeding. Tell your health care provider about: Any allergies you have. All medicines you are taking,  including vitamins, herbs, eye drops, creams, and over-the-counter medicines. Any problems you or family members have had with anesthetic medicines. Any bleeding problems you have. Any surgeries you have had. Any medical conditions you have. Any problems you have had with having bowel movements. Whether you are pregnant or may be pregnant. What are the risks? Generally, this is a safe procedure. However, problems may occur, including: Bleeding. Damage to your intestine. Allergic reactions to medicines given during the procedure. Infection. This is rare. What happens before the procedure? Eating and drinking restrictions Follow instructions from your health care provider about eating or drinking restrictions, which may include: A few days before the procedure: Follow a low-fiber diet. Avoid nuts, seeds, dried fruit, raw fruits, and vegetables. 1-3 days before the procedure: Eat only gelatin dessert or ice pops. Drink only clear liquids, such as water, clear juice, clear broth or bouillon, black coffee or tea, or clear soft drinks or sports drinks. Avoid liquids that contain red or purple dye. The day of the procedure: Do not eat solid foods. You may continue to drink clear liquids until up to 2 hours before the procedure. Do not eat or drink anything starting 2 hours before the procedure, or within the time period that your health care provider recommends. Bowel prep If you were prescribed a bowel prep to take by mouth (orally) to clean out your colon: Take it as told by your health care provider. Starting the day before your  procedure, you will need to drink a large amount of liquid medicine. The liquid will cause you to have many bowel movements of loose stool until your stool becomes almost clear or light green. If your skin or the opening between the buttocks (anus) gets irritated from diarrhea, you may relieve the irritation using: Wipes with medicine in them, such as adult wet  wipes with aloe and vitamin E. A product to soothe skin, such as petroleum jelly. If you vomit while drinking the bowel prep: Take a break for up to 60 minutes. Begin the bowel prep again. Call your health care provider if you keep vomiting or you cannot take the bowel prep without vomiting. To clean out your colon, you may also be given: Laxative medicines. These help you have a bowel movement. Instructions for enema use. An enema is liquid medicine injected into your rectum. Medicines Ask your health care provider about: Changing or stopping your regular medicines or supplements. This is especially important if you are taking iron supplements, diabetes medicines, or blood thinners. Taking medicines such as aspirin and ibuprofen. These medicines can thin your blood. Do not take these medicines unless your health care provider tells you to take them. Taking over-the-counter medicines, vitamins, herbs, and supplements. General instructions Ask your health care provider what steps will be taken to help prevent infection. These may include washing skin with a germ-killing soap. If you will be going home right after the procedure, plan to have a responsible adult: Take you home from the hospital or clinic. You will not be allowed to drive. Care for you for the time you are told. What happens during the procedure?  An IV will be inserted into one of your veins. You will be given a medicine to make you fall asleep (general anesthetic). You will lie on your side with your knees bent. A lubricant will be put on the tube. Then the tube will be: Inserted into your anus. Gently eased through all parts of your large intestine. Air will be sent into your colon to keep it open. This may cause some pressure or cramping. Images will be taken with the camera and will appear on a screen. A small tissue sample may be removed to be looked at under a microscope (biopsy). The tissue may be sent to a lab for  testing if any signs of problems are found. If small polyps are found, they may be removed and checked for cancer cells. When the procedure is finished, the tube will be removed. The procedure may vary among health care providers and hospitals. What happens after the procedure? Your blood pressure, heart rate, breathing rate, and blood oxygen level will be monitored until you leave the hospital or clinic. You may have a small amount of blood in your stool. You may pass gas and have mild cramping or bloating in your abdomen. This is caused by the air that was used to open your colon during the exam. If you were given a sedative during the procedure, it can affect you for several hours. Do not drive or operate machinery until your health care provider says that it is safe. It is up to you to get the results of your procedure. Ask your health care provider, or the department that is doing the procedure, when your results will be ready. Summary A colonoscopy is a procedure to look at the entire large intestine. Follow instructions from your health care provider about eating and drinking before the procedure. If  you were prescribed an oral bowel prep to clean out your colon, take it as told by your health care provider. During the colonoscopy, a flexible tube with a camera on its end is inserted into the anus and then passed into all parts of the large intestine. This information is not intended to replace advice given to you by your health care provider. Make sure you discuss any questions you have with your health care provider. Document Revised: 01/11/2021 Document Reviewed: 09/09/2020 Elsevier Patient Education  [place].

## 2021-11-11 ENCOUNTER — Encounter (HOSPITAL_COMMUNITY): Payer: Self-pay | Admitting: Gastroenterology

## 2021-11-11 ENCOUNTER — Ambulatory Visit (HOSPITAL_COMMUNITY)
Admission: RE | Admit: 2021-11-11 | Discharge: 2021-11-11 | Disposition: A | Discharge status: Home / Self Care | Payer: PPO | Attending: Gastroenterology | Admitting: Gastroenterology

## 2021-11-11 ENCOUNTER — Ambulatory Visit (HOSPITAL_BASED_OUTPATIENT_CLINIC_OR_DEPARTMENT_OTHER): Payer: PPO | Admitting: Anesthesiology

## 2021-11-11 ENCOUNTER — Encounter (HOSPITAL_COMMUNITY)
Admission: RE | Disposition: A | Discharge status: Home / Self Care | Payer: Self-pay | Source: Home / Self Care | Attending: Gastroenterology

## 2021-11-11 ENCOUNTER — Ambulatory Visit (HOSPITAL_COMMUNITY): Payer: PPO | Admitting: Anesthesiology

## 2021-11-11 ENCOUNTER — Other Ambulatory Visit: Payer: Self-pay

## 2021-11-11 DIAGNOSIS — F32A Depression, unspecified: Secondary | ICD-10-CM | POA: Diagnosis not present

## 2021-11-11 DIAGNOSIS — E785 Hyperlipidemia, unspecified: Secondary | ICD-10-CM | POA: Insufficient documentation

## 2021-11-11 DIAGNOSIS — Z1211 Encounter for screening for malignant neoplasm of colon: Secondary | ICD-10-CM | POA: Diagnosis not present

## 2021-11-11 DIAGNOSIS — D124 Benign neoplasm of descending colon: Secondary | ICD-10-CM | POA: Diagnosis not present

## 2021-11-11 DIAGNOSIS — Z86010 Personal history of colonic polyps: Secondary | ICD-10-CM | POA: Diagnosis not present

## 2021-11-11 DIAGNOSIS — D123 Benign neoplasm of transverse colon: Secondary | ICD-10-CM | POA: Insufficient documentation

## 2021-11-11 DIAGNOSIS — K6289 Other specified diseases of anus and rectum: Secondary | ICD-10-CM

## 2021-11-11 DIAGNOSIS — Z09 Encounter for follow-up examination after completed treatment for conditions other than malignant neoplasm: Secondary | ICD-10-CM | POA: Diagnosis not present

## 2021-11-11 DIAGNOSIS — K635 Polyp of colon: Secondary | ICD-10-CM

## 2021-11-11 DIAGNOSIS — K648 Other hemorrhoids: Secondary | ICD-10-CM

## 2021-11-11 DIAGNOSIS — I440 Atrioventricular block, first degree: Secondary | ICD-10-CM | POA: Diagnosis not present

## 2021-11-11 DIAGNOSIS — I428 Other cardiomyopathies: Secondary | ICD-10-CM | POA: Diagnosis not present

## 2021-11-11 DIAGNOSIS — K5790 Diverticulosis of intestine, part unspecified, without perforation or abscess without bleeding: Secondary | ICD-10-CM | POA: Diagnosis not present

## 2021-11-11 DIAGNOSIS — I10 Essential (primary) hypertension: Secondary | ICD-10-CM | POA: Diagnosis not present

## 2021-11-11 DIAGNOSIS — K649 Unspecified hemorrhoids: Secondary | ICD-10-CM | POA: Diagnosis not present

## 2021-11-11 DIAGNOSIS — I4891 Unspecified atrial fibrillation: Secondary | ICD-10-CM | POA: Diagnosis not present

## 2021-11-11 DIAGNOSIS — M069 Rheumatoid arthritis, unspecified: Secondary | ICD-10-CM | POA: Insufficient documentation

## 2021-11-11 DIAGNOSIS — Z87891 Personal history of nicotine dependence: Secondary | ICD-10-CM | POA: Insufficient documentation

## 2021-11-11 DIAGNOSIS — K5730 Diverticulosis of large intestine without perforation or abscess without bleeding: Secondary | ICD-10-CM | POA: Diagnosis not present

## 2021-11-11 HISTORY — PX: COLONOSCOPY WITH PROPOFOL: SHX5780

## 2021-11-11 HISTORY — PX: BIOPSY: SHX5522

## 2021-11-11 HISTORY — PX: POLYPECTOMY: SHX5525

## 2021-11-11 LAB — HM COLONOSCOPY

## 2021-11-11 SURGERY — COLONOSCOPY WITH PROPOFOL
Anesthesia: General

## 2021-11-11 MED ORDER — PROPOFOL 500 MG/50ML IV EMUL
INTRAVENOUS | Status: DC | PRN
  Administered 2021-11-11: 150 ug/kg/min via INTRAVENOUS

## 2021-11-11 MED ORDER — LACTATED RINGERS IV SOLN
INTRAVENOUS | Status: DC
  Administered 2021-11-11: 1000 mL via INTRAVENOUS

## 2021-11-11 MED ORDER — PROPOFOL 10 MG/ML IV BOLUS
INTRAVENOUS | Status: DC | PRN
  Administered 2021-11-11: 50 mg via INTRAVENOUS

## 2021-11-11 MED ORDER — PHENYLEPHRINE HCL (PRESSORS) 10 MG/ML IV SOLN
INTRAVENOUS | Status: DC | PRN
  Administered 2021-11-11 (×2): 160 ug via INTRAVENOUS

## 2021-11-11 NOTE — Transfer of Care (Signed)
Immediate Anesthesia Transfer of Care Note  Patient: Matthew Weiss.  Procedure(s) Performed: COLONOSCOPY WITH PROPOFOL POLYPECTOMY BIOPSY  Patient Location: PACU and Short Stay  Anesthesia Type:General  Level of Consciousness: awake, alert , oriented and patient cooperative  Airway & Oxygen Therapy: Patient Spontanous Breathing  Post-op Assessment: Report given to RN, Post -op Vital signs reviewed and stable and Patient moving all extremities X 4  Post vital signs: Reviewed and stable  Last Vitals:  Vitals Value Taken Time  BP 99/46 11/11/21 1046  Temp    Pulse 67 11/11/21 1046  Resp 20 11/11/21 1046  SpO2 100 % 11/11/21 1046    Last Pain:  Vitals:   11/11/21 1046  TempSrc: Axillary  PainSc: 0-No pain      Patients Stated Pain Goal: 8 (02/23/91 5940)  Complications: No notable events documented.

## 2021-11-11 NOTE — Anesthesia Postprocedure Evaluation (Signed)
Anesthesia Post Note  Patient: Matthew Weiss.  Procedure(s) Performed: COLONOSCOPY WITH PROPOFOL POLYPECTOMY BIOPSY  Patient location during evaluation: Phase II Anesthesia Type: General Level of consciousness: awake and alert and oriented Pain management: pain level controlled Vital Signs Assessment: post-procedure vital signs reviewed and stable Respiratory status: spontaneous breathing, nonlabored ventilation and respiratory function stable Cardiovascular status: blood pressure returned to baseline and stable Postop Assessment: no apparent nausea or vomiting Anesthetic complications: no   No notable events documented.   Last Vitals:  Vitals:   11/11/21 0929 11/11/21 1046  BP: (!) 141/66 (!) 99/46  Pulse: 68 67  Resp: 15 20  Temp: 36.6 C   SpO2: 100% 100%    Last Pain:  Vitals:   11/11/21 1046  TempSrc: Axillary  PainSc: 0-No pain                 Theotis Gerdeman C Taleia Sadowski

## 2021-11-11 NOTE — Discharge Instructions (Signed)
You are being discharged to home.  Resume your previous diet.  We are waiting for your pathology results.  Your physician has recommended a repeat colonoscopy for surveillance based on pathology results.  

## 2021-11-11 NOTE — Anesthesia Preprocedure Evaluation (Signed)
Anesthesia Evaluation  Patient identified by MRN, date of birth, ID band Patient awake    Reviewed: Allergy & Precautions, NPO status , Patient's Chart, lab work & pertinent test results  Airway Mallampati: III  TM Distance: >3 FB Neck ROM: Full    Dental  (+) Dental Advisory Given, Missing   Pulmonary shortness of breath and with exertion, former smoker,    Pulmonary exam normal breath sounds clear to auscultation       Cardiovascular Exercise Tolerance: Good hypertension, Pt. on medications Normal cardiovascular exam+ dysrhythmias Atrial Fibrillation  Rhythm:Regular Rate:Normal  EKG- Sinus brady, first degree AV block   Neuro/Psych PSYCHIATRIC DISORDERS Depression negative neurological ROS     GI/Hepatic negative GI ROS, Neg liver ROS,   Endo/Other  negative endocrine ROS  Renal/GU negative Renal ROS  negative genitourinary   Musculoskeletal  (+) Arthritis , Osteoarthritis,    Abdominal   Peds negative pediatric ROS (+)  Hematology negative hematology ROS (+)   Anesthesia Other Findings   Reproductive/Obstetrics negative OB ROS                           Anesthesia Physical Anesthesia Plan  ASA: 2  Anesthesia Plan: General   Post-op Pain Management: Minimal or no pain anticipated   Induction: Intravenous  PONV Risk Score and Plan: Propofol infusion  Airway Management Planned: Nasal Cannula and Natural Airway  Additional Equipment:   Intra-op Plan:   Post-operative Plan:   Informed Consent: I have reviewed the patients History and Physical, chart, labs and discussed the procedure including the risks, benefits and alternatives for the proposed anesthesia with the patient or authorized representative who has indicated his/her understanding and acceptance.     Dental advisory given  Plan Discussed with: CRNA and Surgeon  Anesthesia Plan Comments:        Anesthesia  Quick Evaluation

## 2021-11-11 NOTE — H&P (Signed)
Matthew Weiss. is an 81 y.o. male.   Chief Complaint: History of colonic polyps HPI: 81 year old male with past medical history of rheumatoid arthritis, depression, hyperlipidemia, hypertension, nonischemic cardiomyopathy, coming for history of colonic polyps.  Last colonoscopy was performed in 2018, he had a 4 mm polyp removed from the distal transverse.  Pathology was tubular adenoma.  The patient denies having any nausea, vomiting, fever, chills, hematochezia, melena, hematemesis, abdominal distention, abdominal pain, diarrhea, jaundice, pruritus or weight loss.   Past Medical History:  Diagnosis Date   Arthritis    RA   Colon polyps    Depression    Dyslipidemia    Hypertension    LBBB (left bundle branch block)    Which comes and goes.   NICM (nonischemic cardiomyopathy) ([place])    Echo 09/22/09, EF 45-50%   Rotator cuff tear    Shortness of breath    exertion    Past Surgical History:  Procedure Laterality Date   BACK SURGERY     CARDIAC CATHETERIZATION  09/22/09   Mild non-obstructive CAD   CARDIOVASCULAR STRESS TEST  06/23/2008   Exercise:  normal perfusion. Low risk.   COLONOSCOPY  10/12/2011   Procedure: COLONOSCOPY;  Surgeon: Rogene Houston, MD;  [facility];  Service: Endoscopy;  Laterality: N/A;  930   COLONOSCOPY N/A 11/10/2016   Procedure: COLONOSCOPY;  Surgeon: Rogene Houston, MD;  [facility];  Service: Endoscopy;  Laterality: N/A;  [place] ; RIGHT EYE Jun 19, 2018, LEFT EYE JUNE 9TH, 2020 , SAW HIS EYE DOCTOR AFTER THE JUNE 9TH PROCEDURE   LEFT FOOT SURGERY   2015   POLYPECTOMY  11/10/2016   Procedure: POLYPECTOMY;  Surgeon: Rogene Houston, MD;  [facility];  Service: Endoscopy;;  colon   SHOULDER OPEN ROTATOR CUFF REPAIR Left 07/25/2018   Procedure: Left shoulder rotator cuff repair with graft;  Surgeon: Latanya Maudlin, MD;  Location: [facility];  Service: Orthopedics;  Laterality: Left;  23mn    TRANSTHORACIC ECHOCARDIOGRAM  09/22/09   EF 45-50%.  No significant valvular disease.     Family History  Problem Relation Age of Onset   Colon cancer Neg Hx    Social History:  reports that he has quit smoking. He has never used smokeless tobacco. He reports that he does not drink alcohol and does not use drugs.  Allergies: No Known Allergies  Medications Prior to Admission  Medication Sig Dispense Refill   amLODipine (NORVASC) 10 MG tablet Take 10 mg by mouth daily.      aspirin EC 81 MG tablet Take 1 tablet (81 mg total) by mouth daily.     carvedilol (COREG) 6.25 MG tablet Take 1 tablet (6.25 mg total) by mouth 2 (two) times daily. 60 tablet 11   cholecalciferol (VITAMIN D3) 25 MCG (1000 UNIT) tablet Take 1,000 Units by mouth daily.     dorzolamide-timolol (COSOPT) 22.3-6.8 MG/ML ophthalmic solution Place 1 drop into both eyes 2 (two) times daily.     folic acid (FOLVITE) 1 MG tablet Take 1 mg by mouth daily.     latanoprost (XALATAN) 0.005 % ophthalmic solution Place 1 drop into both eyes at bedtime.      lisinopril (PRINIVIL,ZESTRIL) 40 MG tablet Take 40 mg by mouth daily.     Menthol, Topical Analgesic, (BIOFREEZE) 10 % CREA Apply 1 Application topically daily as needed (shoulder pain).  methotrexate (RHEUMATREX) 2.5 MG tablet Take 15 mg by mouth every Sunday.     multivitamin (ONE-A-DAY MEN'S) TABS tablet Take 1 tablet by mouth daily.     polyethylene glycol-electrolytes (TRILYTE) 420 g solution Take 4,000 mLs by mouth as directed. 4000 mL 0   rosuvastatin (CRESTOR) 5 MG tablet Take 5 mg by mouth daily.     albuterol (VENTOLIN HFA) 108 (90 Base) MCG/ACT inhaler Inhale 1-2 puffs into the lungs every 6 (six) hours as needed for wheezing or shortness of breath. (Patient not taking: Reported on 11/04/2021) 1 each 0    No results found for this or any previous visit (from the past 48 hour(s)). No results found.  Review of Systems  All other systems reviewed and are  negative.   Blood pressure (!) 141/66, pulse 68, temperature 97.8 F (36.6 C), temperature source Oral, resp. rate 15, SpO2 100 %. Physical Exam  GENERAL: The patient is AO x3, in no acute distress. HEENT: Head is normocephalic and atraumatic. EOMI are intact. Mouth is well hydrated and without lesions. NECK: Supple. No masses LUNGS: Clear to auscultation. No presence of rhonchi/wheezing/rales. Adequate chest expansion HEART: RRR, normal s1 and s2. ABDOMEN: Soft, nontender, no guarding, no peritoneal signs, and nondistended. BS +. No masses. EXTREMITIES: Without any cyanosis, clubbing, rash, lesions or edema. NEUROLOGIC: AOx3, no focal motor deficit. SKIN: no jaundice, no rashes  Assessment/Plan 80 year old male with past medical history of rheumatoid arthritis, depression, hyperlipidemia, hypertension, nonischemic cardiomyopathy, coming for history of colonic polyps.  We will proceed with colonoscopy. Harvel Quale, MD 11/11/2021, 9:42 AM

## 2021-11-11 NOTE — Op Note (Signed)
[facility] Patient Name: Matthew Weiss Procedure Date: 11/11/2021 10:06 AM MRN: [id number] Date of Birth: 08/01/1940 Attending MD: Maylon Peppers ,  CSN: [id number] Age: 81 Admit Type: Outpatient Procedure:                Colonoscopy Indications:              Surveillance: Personal history of adenomatous                            polyps on last colonoscopy 5 years ago Providers:                Maylon Peppers, Caprice Kluver, Rosina Lowenstein, RN,                            Raphael Gibney, Technician Referring MD:              Medicines:                Monitored Anesthesia Care Complications:            No immediate complications. Estimated Blood Loss:     Estimated blood loss: none. Procedure:                Pre-Anesthesia Assessment:                           - Prior to the procedure, a History and Physical                            was performed, and patient medications, allergies                            and sensitivities were reviewed. The patient's                            tolerance of previous anesthesia was reviewed.                           - The risks and benefits of the procedure and the                            sedation options and risks were discussed with the                            patient. All questions were answered and informed                            consent was obtained.                           - ASA Grade Assessment: II - A patient with mild                            systemic disease.                           After obtaining informed consent, the colonoscope  was passed under direct vision. Throughout the                            procedure, the patient's blood pressure, pulse, and                            oxygen saturations were monitored continuously. The                            [id number]-HQ190L ([id number]) scope was introduced through                            the anus and advanced to the the cecum, identified                             by appendiceal orifice and ileocecal valve. The                            colonoscopy was performed without difficulty. The                            patient tolerated the procedure well. The quality                            of the bowel preparation was good. Scope In: 10:18:32 AM Scope Out: 10:43:53 AM Scope Withdrawal Time: 0 hours 17 minutes 34 seconds  Total Procedure Duration: 0 hours 25 minutes 21 seconds  Findings:      The perianal and digital rectal examinations were normal.      Two sessile polyps were found in the descending colon and transverse       colon. The polyps were 3 to 4 mm in size. These polyps were removed with       a cold snare. Resection and retrieval were complete.      A diffuse area of nodular mucosa was found in the distal rectum. Imaging       was performed using white light and narrow band imaging to visualize the       mucosa. This appeared to be associate to hemorrhoids withou clear       adenoamtous features. Biopsies were taken with a cold forceps for       histology.      Scattered small-mouthed diverticula were found in the sigmoid colon.      Non-bleeding internal hemorrhoids were found during retroflexion. The       hemorrhoids were medium-sized. Impression:               - Two 3 to 4 mm polyps in the descending colon and                            in the transverse colon, removed with a cold snare.                            Resected and retrieved.                           -  Nodular mucosa in the distal rectum. Biopsied.                           - Diverticulosis in the sigmoid colon.                           - Non-bleeding internal hemorrhoids. Moderate Sedation:      Per Anesthesia Care Recommendation:           - Discharge patient to home (ambulatory).                           - Resume previous diet.                           - Await pathology results.                           - Repeat colonoscopy for  surveillance based on                            pathology results. Procedure Code(s):        --- Professional ---                           [phone], Colonoscopy, flexible; with removal of                            tumor(s), polyp(s), or other lesion(s) by snare                            technique                           45380, 21, Colonoscopy, flexible; with biopsy,                            single or multiple Diagnosis Code(s):        --- Professional ---                           Z86.010, Personal history of colonic polyps                           K63.5, Polyp of colon                           K62.89, Other specified diseases of anus and rectum                           K64.8, Other hemorrhoids CPT copyright 2019 [employer] Association. All rights reserved. The codes documented in this report are preliminary and upon coder review may  be revised to meet current compliance requirements. Maylon Peppers, MD Maylon Peppers,  11/11/2021 10:52:39 AM This report has been signed electronically. Number of Addenda: 0

## 2021-11-15 LAB — SURGICAL PATHOLOGY

## 2021-11-16 ENCOUNTER — Encounter (HOSPITAL_COMMUNITY): Payer: Self-pay | Admitting: Gastroenterology

## 2021-11-19 ENCOUNTER — Encounter (INDEPENDENT_AMBULATORY_CARE_PROVIDER_SITE_OTHER): Payer: Self-pay | Admitting: *Deleted

## 2022-02-01 DIAGNOSIS — E1169 Type 2 diabetes mellitus with other specified complication: Secondary | ICD-10-CM | POA: Diagnosis not present

## 2022-02-01 DIAGNOSIS — E782 Mixed hyperlipidemia: Secondary | ICD-10-CM | POA: Diagnosis not present

## 2022-02-07 DIAGNOSIS — I10 Essential (primary) hypertension: Secondary | ICD-10-CM | POA: Diagnosis not present

## 2022-02-07 DIAGNOSIS — Z0000 Encounter for general adult medical examination without abnormal findings: Secondary | ICD-10-CM | POA: Diagnosis not present

## 2022-02-07 DIAGNOSIS — H409 Unspecified glaucoma: Secondary | ICD-10-CM | POA: Diagnosis not present

## 2022-02-07 DIAGNOSIS — E1169 Type 2 diabetes mellitus with other specified complication: Secondary | ICD-10-CM | POA: Diagnosis not present

## 2022-02-07 DIAGNOSIS — E782 Mixed hyperlipidemia: Secondary | ICD-10-CM | POA: Diagnosis not present

## 2022-02-07 DIAGNOSIS — J309 Allergic rhinitis, unspecified: Secondary | ICD-10-CM | POA: Diagnosis not present

## 2022-02-07 DIAGNOSIS — J84112 Idiopathic pulmonary fibrosis: Secondary | ICD-10-CM | POA: Diagnosis not present

## 2022-02-07 DIAGNOSIS — M25512 Pain in left shoulder: Secondary | ICD-10-CM | POA: Diagnosis not present

## 2022-02-07 DIAGNOSIS — E785 Hyperlipidemia, unspecified: Secondary | ICD-10-CM | POA: Diagnosis not present

## 2022-02-07 DIAGNOSIS — M069 Rheumatoid arthritis, unspecified: Secondary | ICD-10-CM | POA: Diagnosis not present

## 2022-02-07 DIAGNOSIS — I420 Dilated cardiomyopathy: Secondary | ICD-10-CM | POA: Diagnosis not present

## 2022-03-04 DIAGNOSIS — H40033 Anatomical narrow angle, bilateral: Secondary | ICD-10-CM | POA: Diagnosis not present

## 2022-06-03 DIAGNOSIS — E1169 Type 2 diabetes mellitus with other specified complication: Secondary | ICD-10-CM | POA: Diagnosis not present

## 2022-06-03 DIAGNOSIS — E782 Mixed hyperlipidemia: Secondary | ICD-10-CM | POA: Diagnosis not present

## 2022-06-03 DIAGNOSIS — Z125 Encounter for screening for malignant neoplasm of prostate: Secondary | ICD-10-CM | POA: Diagnosis not present

## 2022-06-10 DIAGNOSIS — E782 Mixed hyperlipidemia: Secondary | ICD-10-CM | POA: Diagnosis not present

## 2022-06-10 DIAGNOSIS — E1169 Type 2 diabetes mellitus with other specified complication: Secondary | ICD-10-CM | POA: Diagnosis not present

## 2022-06-10 DIAGNOSIS — I420 Dilated cardiomyopathy: Secondary | ICD-10-CM | POA: Diagnosis not present

## 2022-06-10 DIAGNOSIS — I10 Essential (primary) hypertension: Secondary | ICD-10-CM | POA: Diagnosis not present

## 2022-06-10 DIAGNOSIS — M25512 Pain in left shoulder: Secondary | ICD-10-CM | POA: Diagnosis not present

## 2022-06-10 DIAGNOSIS — J84112 Idiopathic pulmonary fibrosis: Secondary | ICD-10-CM | POA: Diagnosis not present

## 2022-06-10 DIAGNOSIS — J309 Allergic rhinitis, unspecified: Secondary | ICD-10-CM | POA: Diagnosis not present

## 2022-06-10 DIAGNOSIS — Z7182 Exercise counseling: Secondary | ICD-10-CM | POA: Diagnosis not present

## 2022-06-10 DIAGNOSIS — H409 Unspecified glaucoma: Secondary | ICD-10-CM | POA: Diagnosis not present

## 2022-06-10 DIAGNOSIS — M069 Rheumatoid arthritis, unspecified: Secondary | ICD-10-CM | POA: Diagnosis not present

## 2022-06-10 DIAGNOSIS — Z713 Dietary counseling and surveillance: Secondary | ICD-10-CM | POA: Diagnosis not present

## 2022-06-10 DIAGNOSIS — E785 Hyperlipidemia, unspecified: Secondary | ICD-10-CM | POA: Diagnosis not present

## 2022-09-05 DIAGNOSIS — Z961 Presence of intraocular lens: Secondary | ICD-10-CM | POA: Diagnosis not present

## 2022-09-05 DIAGNOSIS — H40033 Anatomical narrow angle, bilateral: Secondary | ICD-10-CM | POA: Diagnosis not present

## 2022-09-05 DIAGNOSIS — H401131 Primary open-angle glaucoma, bilateral, mild stage: Secondary | ICD-10-CM | POA: Diagnosis not present

## 2022-09-05 DIAGNOSIS — H26491 Other secondary cataract, right eye: Secondary | ICD-10-CM | POA: Diagnosis not present

## 2022-09-09 ENCOUNTER — Other Ambulatory Visit: Payer: Self-pay

## 2022-09-09 ENCOUNTER — Encounter: Payer: Self-pay | Admitting: Cardiovascular Disease

## 2022-09-09 ENCOUNTER — Ambulatory Visit: Payer: PPO | Attending: Cardiovascular Disease | Admitting: Cardiovascular Disease

## 2022-09-09 VITALS — BP 108/56 | HR 64 | Ht 69.0 in | Wt 151.8 lb

## 2022-09-09 DIAGNOSIS — I447 Left bundle-branch block, unspecified: Secondary | ICD-10-CM | POA: Diagnosis not present

## 2022-09-09 DIAGNOSIS — M069 Rheumatoid arthritis, unspecified: Secondary | ICD-10-CM

## 2022-09-09 DIAGNOSIS — E7800 Pure hypercholesterolemia, unspecified: Secondary | ICD-10-CM

## 2022-09-09 DIAGNOSIS — I428 Other cardiomyopathies: Secondary | ICD-10-CM

## 2022-09-09 DIAGNOSIS — I10 Essential (primary) hypertension: Secondary | ICD-10-CM

## 2022-09-09 MED ORDER — AMLODIPINE BESYLATE 5 MG PO TABS: 5.0000 mg | ORAL_TABLET | Freq: Every day | ORAL | 3 refills | Status: AC

## 2022-09-09 NOTE — Progress Notes (Signed)
Cardiology Office Note    Date:  09/09/2022   ID:  Matthew Zachry., DOB Oct 22, 1940, MRN [id number]  PCP:  Benita Stabile, MD  Cardiologist:   Thurmon Fair, MD   No chief complaint on file.   History of Present Illness:  Matthew Schley. is a 82 y.o. male with mild nonischemic cardiomyopathy, rate related left bundle branch block, prediabetes, hypercholesterolemia,hypertension returning for follow-up.   He is maintaining a very healthy lifestyle.  He will avoid starchy and sweet foods.  He walks daily 2-1/2 to 3 miles in 30 minutes.  Also rides a stationary bike the other days of the week.  His BMI is staying around 22.  Has significant rheumatoid arthritis changes in his hands, but the disease has not really progressed.  He does have substantial stiffness and limited finger flexion in the left hand.  The patient specifically denies any chest pain at rest exertion, dyspnea at rest or with exertion, orthopnea, paroxysmal nocturnal dyspnea, syncope, palpitations, focal neurological deficits, intermittent claudication, lower extremity edema, unexplained weight gain, cough, hemoptysis or wheezing.   We have not reevaluated left ventricular systolic function since 2011.  His left bundle branch block initially appeared to be rate related, but is now present at slow heart rates.  He checks his blood pressure every day.  Blood pressure has been very well-controlled and he we will often see diastolic blood pressures in the 50s.  He does not have dizziness though.  He has mildly depressed left ventricular systolic function with an ejection fraction of 45-50% (last echo reviewed 2011) and is taking both ace inhibitors and beta blockers.  He had a normal nuclear treadmill stress test in 2010, exercised to 13 mets.  Years ago, his LBBB was rate related, but now is present even at slow heart rates.  Past Medical History:  Diagnosis Date   Arthritis    RA   Colon polyps    Depression     Dyslipidemia    Hypertension    LBBB (left bundle branch block)    Which comes and goes.   NICM (nonischemic cardiomyopathy) ([facility])    Echo 09/22/09, EF 45-50%   Rotator cuff tear    Shortness of breath    exertion    Past Surgical History:  Procedure Laterality Date   BACK SURGERY     BIOPSY  11/11/2021   Procedure: BIOPSY;  Surgeon: Dolores Frame, MD;  [facility];  Service: Gastroenterology;;   CARDIAC CATHETERIZATION  09/22/09   Mild non-obstructive CAD   CARDIOVASCULAR STRESS TEST  06/23/2008   Exercise:  normal perfusion. Low risk.   COLONOSCOPY  10/12/2011   Procedure: COLONOSCOPY;  Surgeon: Malissa Hippo, MD;  [facility];  Service: Endoscopy;  Laterality: N/A;  930   COLONOSCOPY N/A 11/10/2016   Procedure: COLONOSCOPY;  Surgeon: Malissa Hippo, MD;  [facility];  Service: Endoscopy;  Laterality: N/A;  930   COLONOSCOPY WITH PROPOFOL N/A 11/11/2021   Procedure: COLONOSCOPY WITH PROPOFOL;  Surgeon: Dolores Frame, MD;  [facility];  Service: Gastroenterology;  Laterality: N/A;  1030 ASA 3   EYE SURGERY     [place] ; RIGHT EYE Jun 19, 2018, LEFT EYE JUNE 9TH, 2020 , SAW HIS EYE DOCTOR AFTER THE JUNE 9TH PROCEDURE   LEFT FOOT SURGERY   2015   POLYPECTOMY  11/10/2016   Procedure: POLYPECTOMY;  Surgeon: Malissa Hippo, MD;  [facility];  Service: Endoscopy;;  colon   POLYPECTOMY  11/11/2021   Procedure: POLYPECTOMY;  Surgeon: Dolores Frame, MD;  [facility];  Service: Gastroenterology;;   SHOULDER OPEN ROTATOR CUFF REPAIR Left 07/25/2018   Procedure: Left shoulder rotator cuff repair with graft;  Surgeon: Ranee Gosselin, MD;  [facility];  Service: Orthopedics;  Laterality: Left;    TRANSTHORACIC ECHOCARDIOGRAM  09/22/09   EF 45-50%.  No significant valvular disease.     Current Medications: Outpatient Medications Prior to Visit  Medication Sig Dispense  Refill   aspirin EC 81 MG tablet Take 1 tablet (81 mg total) by mouth daily.     carvedilol (COREG) 6.25 MG tablet Take 1 tablet (6.25 mg total) by mouth 2 (two) times daily. 60 tablet 11   cholecalciferol (VITAMIN D3) 25 MCG (1000 UNIT) tablet Take 1,000 Units by mouth daily.     dorzolamide-timolol (COSOPT) 22.3-6.8 MG/ML ophthalmic solution Place 1 drop into both eyes 2 (two) times daily.     folic acid (FOLVITE) 1 MG tablet Take 1 mg by mouth daily.     latanoprost (XALATAN) 0.005 % ophthalmic solution Place 1 drop into both eyes at bedtime.      lisinopril (PRINIVIL,ZESTRIL) 40 MG tablet Take 40 mg by mouth daily.     Menthol, Topical Analgesic, (BIOFREEZE) 10 % CREA Apply 1 Application topically daily as needed (shoulder pain).     methotrexate (RHEUMATREX) 2.5 MG tablet Take 15 mg by mouth every Sunday.     multivitamin (ONE-A-DAY MEN'S) TABS tablet Take 1 tablet by mouth daily.     rosuvastatin (CRESTOR) 5 MG tablet Take 5 mg by mouth daily.     amLODipine (NORVASC) 10 MG tablet Take 10 mg by mouth daily.      albuterol (VENTOLIN HFA) 108 (90 Base) MCG/ACT inhaler Inhale 1-2 puffs into the lungs every 6 (six) hours as needed for wheezing or shortness of breath. (Patient not taking: Reported on 09/09/2022) 1 each 0   polyethylene glycol-electrolytes (TRILYTE) 420 g solution Take 4,000 mLs by mouth as directed. (Patient not taking: Reported on 09/09/2022) 4000 mL 0   No facility-administered medications prior to visit.     Allergies:   Patient has no known allergies.   Social History   Socioeconomic History   Marital status: Married    Spouse name: Not on file   Number of children: Not on file   Years of education: Not on file   Highest education level: Not on file  Occupational History   Not on file  Tobacco Use   Smoking status: Former    Types: Cigarettes   Smokeless tobacco: Never  Vaping Use   Vaping status: Never Used  Substance and Sexual Activity   Alcohol use: No    Drug use: No   Sexual activity: Not on file  Other Topics Concern   Not on file  Social History Narrative   Not on file   Social Determinants of Health   Financial Resource Strain: Not on file  Food Insecurity: Not on file  Transportation Needs: Not on file  Physical Activity: Not on file  Stress: Not on file  Social Connections: Not on file      ROS:   Please see the history of present illness.    ROS All other systems are reviewed and are negative.   PHYSICAL EXAM:   VS:  BP (!) 108/56 (BP Location: Left Arm, Patient Position: Sitting, Cuff Size: Normal)  Pulse 64   Ht 5\' 9"  (1.753 m)   Wt 151 lb 12.8 oz (68.9 kg)   SpO2 99%   BMI 22.42 kg/m      General: Alert, oriented x3, no distress, appears very lean and fit Head: no evidence of trauma, PERRL, EOMI, no exophtalmos or lid lag, no myxedema, no xanthelasma; normal ears, nose and oropharynx Neck: normal jugular venous pulsations and no hepatojugular reflux; brisk carotid pulses without delay and no carotid bruits Chest: clear to auscultation, no signs of consolidation by percussion or palpation, normal fremitus, symmetrical and full respiratory excursions Cardiovascular: normal position and quality of the apical impulse, regular rhythm, normal first and paradoxically split second heart sounds, no murmurs, rubs or gallops Abdomen: no tenderness or distention, no masses by palpation, no abnormal pulsatility or arterial bruits, normal bowel sounds, no hepatosplenomegaly Extremities: no clubbing, cyanosis or edema; 2+ radial, ulnar and brachial pulses bilaterally; 2+ right femoral, posterior tibial and dorsalis pedis pulses; 2+ left femoral, posterior tibial and dorsalis pedis pulses; no subclavian or femoral bruits.  He has significant typical rheumatoid arthritis changes in both hands, particular prominent in the 2nd and 3rd metacarpophalangeal joints of the left hand. Neurological: grossly nonfocal Psych: Normal mood and  affect    Wt Readings from Last 3 Encounters:  09/09/22 151 lb 12.8 oz (68.9 kg)  11/08/21 148 lb (67.1 kg)  06/03/21 151 lb 9.6 oz (68.8 kg)    Studies/Labs Reviewed:   EKG:  EKG is ordered today.  It is very similar to previous tracings showing sinus rhythm with first-degree AV block (222 ms) and left bundle branch block.  Today's tracing does not show right axis deviation.  QRS is 140 ms, QTc is normal at 464 ms.  Recent Labs: No results found for requested labs within last 365 days.  12/31/2020 hemoglobin 13.3, creatinine 0.9, potassium 3.9, ALT 13 Hemoglobin A1c 6.1%  06/03/2022   hemoglobin A1c 6.2%, hemoglobin 13.1, creatinine 0.95, potassium 4.1, ALT 50  Lipid Panel    Component Value Date/Time   CHOL  09/23/2009 0406    109        ATP III CLASSIFICATION:  <200     mg/dL   Desirable  829-562  mg/dL   Borderline High  >=130    mg/dL   High          TRIG 28 09/23/2009 0406   HDL 45 09/23/2009 0406   CHOLHDL 2.4 09/23/2009 0406   VLDL 6 09/23/2009 0406   LDLCALC  09/23/2009 0406    58        Total Cholesterol/HDL:CHD Risk Coronary Heart Disease Risk Table                     Men   Women  1/2 Average Risk   3.4   3.3  Average Risk       5.0   4.4  2 X Average Risk   9.6   7.1  3 X Average Risk  23.4   11.0        Use the calculated Patient Ratio above and the CHD Risk Table to determine the patient's CHD Risk.        ATP III CLASSIFICATION (LDL):  <100     mg/dL   Optimal  865-784  mg/dL   Near or Above                    Optimal  130-159  mg/dL  Borderline  160-189  mg/dL   High  >027     mg/dL   Very High   25/36/6440 total cholesterol 135, HDL 60, triglycerides 47  34/74/2595 total cholesterol 139, HDL 60, triglycerides 73  63/09/7562 total cholesterol 124, HDL 55, LDL 68, triglycerides 49  33/29/5188 total cholesterol 121, HDL 59, LDL 49, triglycerides 57  ASSESSMENT:    1. NICM (nonischemic cardiomyopathy) (HCC)   2. Essential hypertension    3. LBBB (left bundle branch block),  Intermitent    4. Hypercholesterolemia   5. Rheumatoid arthritis involving multiple sites, unspecified whether rheumatoid factor present (HCC)       PLAN:  In order of problems listed above:  CMP: Completely asymptomatic, NYHA functional class I, very active lifestyle, clinically euvolemic without diuretics.  .  Reduction in LVEF can be mostly attributed to left ventricular dyssynchrony due to LBBB.  On beta-blocker and ACE inhibitor.   I do not think he really needs Entresto or addition of an SGLT2 inhibitor.  Similarly, no indication for CRT-P at this time. HTN: Diastolic blood pressure is quite low.  Reduce amlodipine to 5 mg daily. LBBB: This was initially a rate related abnormality but is now seen at slow heart rates and he has an accompanying prolonged PR interval.  He currently has no detectable signs or symptoms of high-grade AV block.  No indication for pacemaker at this time.  Asked him to alert [place] if he develops syncope/presyncope or significant bradycardia HLP: All his lipid parameters are within desirable range.  Continue statin. RA: Symptoms with fair control on methotrexate.  Normal ALT recently.  Medication Adjustments/Labs and Tests Ordered: Current medicines are reviewed at length with the patient today.  Concerns regarding medicines are outlined above.  Medication changes, Labs and Tests ordered today are listed in the Patient Instructions below. Patient Instructions  Medication Instructions:  Your physician has recommended you make the following change in your medication:  1) DECREASE amlodipine to 5 mg once daily  *If you need a refill on your cardiac medications before your next appointment, please call your pharmacy*  Follow-Up: At [facility], you and your health needs are our priority.  As part of our continuing mission to provide you with exceptional heart care, we have created designated Provider Care Teams.  These  Care Teams include your primary Cardiologist (physician) and Advanced Practice Providers (APPs -  Physician Assistants and Nurse Practitioners) who all work together to provide you with the care you need, when you need it.  Your next appointment:   1 year  Provider:   Thurmon Fair, MD       Signed, Thurmon Fair, MD  09/09/2022 9:40 AM    [facility] [address] [place], [place], [place]  [address] Phone: [phone]; Fax: [phone]

## 2022-09-09 NOTE — Patient Instructions (Signed)
Medication Instructions:  Your physician has recommended you make the following change in your medication:  1) DECREASE amlodipine to 5 mg once daily  *If you need a refill on your cardiac medications before your next appointment, please call your pharmacy*  Follow-Up: At [facility], you and your health needs are our priority.  As part of our continuing mission to provide you with exceptional heart care, we have created designated Provider Care Teams.  These Care Teams include your primary Cardiologist (physician) and Advanced Practice Providers (APPs -  Physician Assistants and Nurse Practitioners) who all work together to provide you with the care you need, when you need it.  Your next appointment:   1 year  Provider:   Thurmon Fair, MD

## 2022-10-07 DIAGNOSIS — E782 Mixed hyperlipidemia: Secondary | ICD-10-CM | POA: Diagnosis not present

## 2022-10-07 DIAGNOSIS — E1169 Type 2 diabetes mellitus with other specified complication: Secondary | ICD-10-CM | POA: Diagnosis not present

## 2022-10-08 LAB — LAB REPORT - SCANNED
A1c: 5.9
Albumin, Urine POC: 30.9
Albumin/Creatinine Ratio, Urine, POC: 37
Creatinine, POC: 83.7 mg/dL
EGFR: 80

## 2022-10-13 DIAGNOSIS — J84112 Idiopathic pulmonary fibrosis: Secondary | ICD-10-CM | POA: Diagnosis not present

## 2022-10-13 DIAGNOSIS — J309 Allergic rhinitis, unspecified: Secondary | ICD-10-CM | POA: Diagnosis not present

## 2022-10-13 DIAGNOSIS — E782 Mixed hyperlipidemia: Secondary | ICD-10-CM | POA: Diagnosis not present

## 2022-10-13 DIAGNOSIS — H409 Unspecified glaucoma: Secondary | ICD-10-CM | POA: Diagnosis not present

## 2022-10-13 DIAGNOSIS — M069 Rheumatoid arthritis, unspecified: Secondary | ICD-10-CM | POA: Diagnosis not present

## 2022-10-13 DIAGNOSIS — E1159 Type 2 diabetes mellitus with other circulatory complications: Secondary | ICD-10-CM | POA: Diagnosis not present

## 2022-10-13 DIAGNOSIS — E785 Hyperlipidemia, unspecified: Secondary | ICD-10-CM | POA: Diagnosis not present

## 2022-10-13 DIAGNOSIS — M25512 Pain in left shoulder: Secondary | ICD-10-CM | POA: Diagnosis not present

## 2022-10-13 DIAGNOSIS — Z23 Encounter for immunization: Secondary | ICD-10-CM | POA: Diagnosis not present

## 2022-10-13 DIAGNOSIS — I420 Dilated cardiomyopathy: Secondary | ICD-10-CM | POA: Diagnosis not present

## 2022-10-13 DIAGNOSIS — E1169 Type 2 diabetes mellitus with other specified complication: Secondary | ICD-10-CM | POA: Diagnosis not present

## 2022-10-13 DIAGNOSIS — I10 Essential (primary) hypertension: Secondary | ICD-10-CM | POA: Diagnosis not present

## 2023-03-09 DIAGNOSIS — H40033 Anatomical narrow angle, bilateral: Secondary | ICD-10-CM | POA: Diagnosis not present

## 2023-03-09 DIAGNOSIS — H26493 Other secondary cataract, bilateral: Secondary | ICD-10-CM | POA: Diagnosis not present

## 2023-04-06 DIAGNOSIS — E782 Mixed hyperlipidemia: Secondary | ICD-10-CM | POA: Diagnosis not present

## 2023-04-06 DIAGNOSIS — E1169 Type 2 diabetes mellitus with other specified complication: Secondary | ICD-10-CM | POA: Diagnosis not present

## 2023-04-07 LAB — LAB REPORT - SCANNED
A1c: 6.2
Albumin, Urine POC: 1831
Creatinine, POC: 99.3 mg/dL
EGFR: 82

## 2023-04-11 DIAGNOSIS — H409 Unspecified glaucoma: Secondary | ICD-10-CM | POA: Diagnosis not present

## 2023-04-11 DIAGNOSIS — I420 Dilated cardiomyopathy: Secondary | ICD-10-CM | POA: Diagnosis not present

## 2023-04-11 DIAGNOSIS — M069 Rheumatoid arthritis, unspecified: Secondary | ICD-10-CM | POA: Diagnosis not present

## 2023-04-11 DIAGNOSIS — E1159 Type 2 diabetes mellitus with other circulatory complications: Secondary | ICD-10-CM | POA: Diagnosis not present

## 2023-04-11 DIAGNOSIS — E1169 Type 2 diabetes mellitus with other specified complication: Secondary | ICD-10-CM | POA: Diagnosis not present

## 2023-04-11 DIAGNOSIS — I10 Essential (primary) hypertension: Secondary | ICD-10-CM | POA: Diagnosis not present

## 2023-04-11 DIAGNOSIS — E782 Mixed hyperlipidemia: Secondary | ICD-10-CM | POA: Diagnosis not present

## 2023-04-11 DIAGNOSIS — J309 Allergic rhinitis, unspecified: Secondary | ICD-10-CM | POA: Diagnosis not present

## 2023-04-11 DIAGNOSIS — E785 Hyperlipidemia, unspecified: Secondary | ICD-10-CM | POA: Diagnosis not present

## 2023-04-11 DIAGNOSIS — D649 Anemia, unspecified: Secondary | ICD-10-CM | POA: Diagnosis not present

## 2023-04-11 DIAGNOSIS — M25512 Pain in left shoulder: Secondary | ICD-10-CM | POA: Diagnosis not present

## 2023-04-11 DIAGNOSIS — J84112 Idiopathic pulmonary fibrosis: Secondary | ICD-10-CM | POA: Diagnosis not present

## 2023-04-26 ENCOUNTER — Other Ambulatory Visit: Payer: Self-pay

## 2023-04-26 ENCOUNTER — Emergency Department (HOSPITAL_COMMUNITY)

## 2023-04-26 ENCOUNTER — Encounter (HOSPITAL_COMMUNITY): Payer: Self-pay

## 2023-04-26 ENCOUNTER — Emergency Department (HOSPITAL_COMMUNITY)
Admission: EM | Admit: 2023-04-26 | Discharge: 2023-04-26 | Disposition: A | Discharge status: Home / Self Care | Attending: Emergency Medicine | Admitting: Emergency Medicine

## 2023-04-26 DIAGNOSIS — W19XXXA Unspecified fall, initial encounter: Secondary | ICD-10-CM | POA: Diagnosis not present

## 2023-04-26 DIAGNOSIS — Z79899 Other long term (current) drug therapy: Secondary | ICD-10-CM | POA: Insufficient documentation

## 2023-04-26 DIAGNOSIS — S61412A Laceration without foreign body of left hand, initial encounter: Secondary | ICD-10-CM | POA: Diagnosis not present

## 2023-04-26 DIAGNOSIS — S61012A Laceration without foreign body of left thumb without damage to nail, initial encounter: Secondary | ICD-10-CM | POA: Diagnosis not present

## 2023-04-26 DIAGNOSIS — S0081XA Abrasion of other part of head, initial encounter: Secondary | ICD-10-CM | POA: Diagnosis not present

## 2023-04-26 DIAGNOSIS — S6992XA Unspecified injury of left wrist, hand and finger(s), initial encounter: Secondary | ICD-10-CM | POA: Diagnosis present

## 2023-04-26 DIAGNOSIS — S8991XA Unspecified injury of right lower leg, initial encounter: Secondary | ICD-10-CM | POA: Diagnosis not present

## 2023-04-26 DIAGNOSIS — M7989 Other specified soft tissue disorders: Secondary | ICD-10-CM | POA: Diagnosis not present

## 2023-04-26 DIAGNOSIS — I10 Essential (primary) hypertension: Secondary | ICD-10-CM | POA: Insufficient documentation

## 2023-04-26 DIAGNOSIS — S62522A Displaced fracture of distal phalanx of left thumb, initial encounter for closed fracture: Secondary | ICD-10-CM | POA: Insufficient documentation

## 2023-04-26 DIAGNOSIS — S61319A Laceration without foreign body of unspecified finger with damage to nail, initial encounter: Secondary | ICD-10-CM

## 2023-04-26 DIAGNOSIS — Z7982 Long term (current) use of aspirin: Secondary | ICD-10-CM | POA: Diagnosis not present

## 2023-04-26 DIAGNOSIS — S199XXA Unspecified injury of neck, initial encounter: Secondary | ICD-10-CM | POA: Diagnosis not present

## 2023-04-26 DIAGNOSIS — S0990XA Unspecified injury of head, initial encounter: Secondary | ICD-10-CM | POA: Diagnosis not present

## 2023-04-26 DIAGNOSIS — I6523 Occlusion and stenosis of bilateral carotid arteries: Secondary | ICD-10-CM | POA: Diagnosis not present

## 2023-04-26 DIAGNOSIS — S0993XA Unspecified injury of face, initial encounter: Secondary | ICD-10-CM | POA: Diagnosis not present

## 2023-04-26 DIAGNOSIS — S62639A Displaced fracture of distal phalanx of unspecified finger, initial encounter for closed fracture: Secondary | ICD-10-CM | POA: Diagnosis not present

## 2023-04-26 DIAGNOSIS — S61112A Laceration without foreign body of left thumb with damage to nail, initial encounter: Secondary | ICD-10-CM | POA: Diagnosis not present

## 2023-04-26 MED ORDER — LIDOCAINE HCL (PF) 1 % IJ SOLN
10.0000 mL | Freq: Once | INTRAMUSCULAR | Status: AC
  Administered 2023-04-26: 10 mL via INTRADERMAL
  Filled 2023-04-26: qty 10

## 2023-04-26 MED ORDER — CEPHALEXIN 250 MG PO CAPS
250.0000 mg | ORAL_CAPSULE | Freq: Once | ORAL | Status: AC
  Administered 2023-04-26: 250 mg via ORAL
  Filled 2023-04-26 (×2): qty 1

## 2023-04-26 MED ORDER — CEPHALEXIN 250 MG PO CAPS: 250.0000 mg | ORAL_CAPSULE | Freq: Four times a day (QID) | ORAL | 0 refills | Status: AC

## 2023-04-26 NOTE — ED Provider Notes (Signed)
 [place] EMERGENCY DEPARTMENT AT [facility] Provider Note   CSN: [id number] Arrival date & time: 04/26/23  1732     History {Add pertinent medical, surgical, social history, OB history to HPI:1} Chief Complaint  Patient presents with  . Fall    Matthew Weiss. is a 83 y.o. male with PMH as listed below who arrived via POV c/o injury to left face, left hand and right knee following a mechanical fall where Pt reports he tripped taking out the trash cans. Pt denies LOC. Does not take blood thinners. Endorses pain mostly in the left thumb, nail avulsion injury noted, bleeding controlled on arrival. Otherwise had been in his NSOH.   Past Medical History:  Diagnosis Date  . Arthritis    RA  . Colon polyps   . Depression   . Dyslipidemia   . Hypertension   . LBBB (left bundle branch block)    Which comes and goes.  Marland Kitchen NICM (nonischemic cardiomyopathy) (HCC)    Echo 09/22/09, EF 45-50%  . Rotator cuff tear   . Shortness of breath    exertion       Home Medications Prior to Admission medications   Medication Sig Start Date End Date Taking? Authorizing Provider  cephALEXin (KEFLEX) 250 MG capsule Take 1 capsule (250 mg total) by mouth 4 (four) times daily. 04/26/23  Yes Loetta Rough, MD  amLODipine (NORVASC) 5 MG tablet Take 1 tablet (5 mg total) by mouth daily. 09/09/22 12/08/22  Croitoru, Mihai, MD  aspirin EC 81 MG tablet Take 1 tablet (81 mg total) by mouth daily. 11/11/16   Rehman, Joline Maxcy, MD  carvedilol (COREG) 6.25 MG tablet Take 1 tablet (6.25 mg total) by mouth 2 (two) times daily. 10/30/15   Croitoru, Mihai, MD  cholecalciferol (VITAMIN D3) 25 MCG (1000 UNIT) tablet Take 1,000 Units by mouth daily.    [provider]  dorzolamide-timolol (COSOPT) 22.3-6.8 MG/ML ophthalmic solution Place 1 drop into both eyes 2 (two) times daily.    [provider]  folic acid (FOLVITE) 1 MG tablet Take 1 mg by mouth daily. 07/24/13   [provider]   latanoprost (XALATAN) 0.005 % ophthalmic solution Place 1 drop into both eyes at bedtime.  10/28/15   [provider]  lisinopril (PRINIVIL,ZESTRIL) 40 MG tablet Take 40 mg by mouth daily.    [provider]  Menthol, Topical Analgesic, (BIOFREEZE) 10 % CREA Apply 1 Application topically daily as needed (shoulder pain).    [provider]  methotrexate (RHEUMATREX) 2.5 MG tablet Take 15 mg by mouth every Sunday. 09/27/13   [provider]  multivitamin (ONE-A-DAY MEN'S) TABS tablet Take 1 tablet by mouth daily.    [provider]  rosuvastatin (CRESTOR) 5 MG tablet Take 5 mg by mouth daily. 03/18/21   [provider]      Allergies    Patient has no known allergies.    Review of Systems   Review of Systems A 10 point review of systems was performed and is negative unless otherwise reported in HPI.  Physical Exam Updated Vital Signs BP (!) 146/77   Pulse 72   Temp 98.2 F (36.8 C) (Oral)   Resp 18   Ht 5\' 9"  (1.753 m)   Wt 69 kg   SpO2 97%   BMI 22.46 kg/m  Physical Exam General: Normal appearing {Desc; male/male:11659}, lying in bed.  HEENT: PERRLA, Sclera anicteric, MMM, trachea midline.  Cardiology: RRR, no murmurs/rubs/gallops.  BL radial and DP pulses equal bilaterally.  Resp: Normal respiratory rate and effort. CTAB, no wheezes, rhonchi, crackles.  Abd: Soft, non-tender, non-distended. No rebound tenderness or guarding.  GU: Deferred. MSK: No peripheral edema or signs of trauma. Extremities without deformity or TTP. No cyanosis or clubbing. Skin: warm, dry. No rashes or lesions. Back: No CVA tenderness Neuro: A&Ox4, CNs II-XII grossly intact. MAEs. Sensation grossly intact.  Psych: Normal mood and affect.   ED Results / Procedures / Treatments   Labs (all labs ordered are listed, but only abnormal results are displayed) Labs Reviewed - No data to display  EKG None  Radiology   Procedures Procedures   {Document cardiac monitor, telemetry assessment procedure when appropriate:1}  Medications Ordered in ED Medications  lidocaine (PF) (XYLOCAINE) 1 % injection 10 mL (10 mLs Intradermal Given 04/26/23 2130)  cephALEXin (KEFLEX) capsule 250 mg (250 mg Oral Given 04/26/23 2304)    ED Course/ Medical Decision Making/ A&P                          Medical Decision Making Amount and/or Complexity of Data Reviewed Radiology:  Decision-making details documented in ED Course.  Risk Prescription drug management.    This patient presents to the ED for concern of ***, this involves an extensive number of treatment options, and is a complaint that carries with it a high risk of complications and morbidity.  I considered the following differential and admission for this acute, potentially life threatening condition.   MDM:    ***  Clinical Course as of 04/30/23 2245  Wed Apr 26, 2023  1856 DG Finger Thumb Left Acute displaced first digit distal phalangeal tuft avulsion fracture.   [HN]  1857 CT Head Wo Contrast 1. No acute intracranial abnormality. 2.  No acute displaced facial fracture. 3. No acute displaced fracture or traumatic listhesis of the cervical spine.   [HN]  2212 Complex nailbed laceration repaired. D/w Dr. Dallas Schimke who can see the patient in clinic on Friday. He advised dressing and a splint. [HN]    Clinical Course User Index [HN] Loetta Rough, MD    Labs: I Ordered, and personally interpreted labs.  The pertinent results include:  ***  Imaging Studies ordered: I ordered imaging studies including *** I independently visualized and interpreted imaging. I agree with the radiologist interpretation  Additional history obtained from ***.  External records from outside source obtained and reviewed including ***  Cardiac Monitoring: .The patient was maintained on a cardiac monitor.  I personally viewed and interpreted the cardiac monitored which showed an underlying  rhythm of: ***  Reevaluation: After the interventions noted above, I reevaluated the patient and found that they have :{resolved/improved/worsened:23923::"improved"}  Social Determinants of Health: .***  Disposition:  ***  Co morbidities that complicate the patient evaluation . Past Medical History:  Diagnosis Date  . Arthritis    RA  . Colon polyps   . Depression   . Dyslipidemia   . Hypertension   . LBBB (left bundle branch block)    Which comes and goes.  Marland Kitchen NICM (nonischemic cardiomyopathy) (HCC)    Echo 09/22/09, EF 45-50%  . Rotator cuff tear   . Shortness of breath    exertion     Medicines Meds ordered this encounter  Medications  . lidocaine (PF) (XYLOCAINE) 1 % injection 10 mL  . cephALEXin (KEFLEX) capsule 250 mg  . cephALEXin (KEFLEX) 250 MG capsule  Sig: Take 1 capsule (250 mg total) by mouth 4 (four) times daily.    Dispense:  28 capsule    Refill:  0    I have reviewed the patients home medicines and have made adjustments as needed  Problem List / ED Course: Problem List Items Addressed This Visit   None Visit Diagnoses       Nailbed laceration, finger, initial encounter    -  Primary     Fall in home, initial encounter         Facial abrasion, initial encounter                {Document critical care time when appropriate:1} {Document review of labs and clinical decision tools ie heart score, Chads2Vasc2 etc:1}  {Document your independent review of radiology images, and any outside records:1} {Document your discussion with family members, caretakers, and with consultants:1} {Document social determinants of health affecting pt's care:1} {Document your decision making why or why not admission, treatments were needed:1}  This note was created using dictation software, which may contain spelling or grammatical errors.

## 2023-04-26 NOTE — Discharge Instructions (Addendum)
 Thank you for coming to [facility] Emergency Department. You were seen for fall. We did an exam, labs, and imaging, and these showed a complex nailbed laceration and a tuft fracture (broken tip of the finger).  You also have abrasions to the face.  Please keep this clean and you can apply antibacterial ointment as well.  Please keep the thumb clean and dry.  We have prescribed Keflex which you can take 250 mg every 6 hours for the next 7 days starting tomorrow.  Please follow-up with Dr. Dallas Schimke in the clinic on Friday.  You can call his office to set up a time. You can alternate taking Tylenol and ibuprofen as needed for pain. You can take 650mg  tylenol (acetaminophen) every 4-6 hours, and 600 mg ibuprofen 3 times a day.   6 total nonabsorbable sutures were placed on your left thumb, as well as four absorbable sutures underneath the nail. The nail was the glued back in place.   Do not hesitate to return to the ED or call 911 if you experience: -Worsening symptoms -Signs of infection including worsening pain, swelling, redness, pus drainage, fever/chills -Lightheadedness, passing out -Anything else that concerns you

## 2023-04-26 NOTE — ED Triage Notes (Signed)
 Pt arrived via POV c/o injury to left face, left hand and right knee following a mechanical fall where Pt reports he tripped taking out the trash cans. Pt denies LOC.

## 2023-04-26 NOTE — ED Notes (Signed)
 Lidocaine, suture cart, and dermabond placed at bedside

## 2023-04-27 DIAGNOSIS — H26491 Other secondary cataract, right eye: Secondary | ICD-10-CM | POA: Diagnosis not present

## 2023-04-27 DIAGNOSIS — H26492 Other secondary cataract, left eye: Secondary | ICD-10-CM | POA: Diagnosis not present

## 2023-04-28 ENCOUNTER — Encounter: Payer: Self-pay | Admitting: Orthopedic Surgery

## 2023-04-28 ENCOUNTER — Ambulatory Visit: Admitting: Orthopedic Surgery

## 2023-04-28 VITALS — BP 146/75 | HR 62 | Ht 69.0 in | Wt 149.0 lb

## 2023-04-28 DIAGNOSIS — S62522A Displaced fracture of distal phalanx of left thumb, initial encounter for closed fracture: Secondary | ICD-10-CM | POA: Diagnosis not present

## 2023-04-28 DIAGNOSIS — S61319A Laceration without foreign body of unspecified finger with damage to nail, initial encounter: Secondary | ICD-10-CM

## 2023-04-28 NOTE — Progress Notes (Signed)
 New Patient Visit  Assessment: Matthew Maslow. is a 83 y.o. male with the following: 1. Laceration of nail bed of finger, initial encounter  Plan: Matthew Wilkie. fell couple days ago, and sustained a laceration to the left thumb.  He is left-hand dominant.  Tissue appears healthy today.  Nail remains in place.  Continues to take antibiotics.  Recommend daily dressing changes.  Follow-up in 1 week, at which time we may remove the sutures.  Follow-up: Return in about 1 week (around 05/05/2023).  Subjective:  Chief Complaint  Patient presents with   Hand Injury    L hand thumb DOI 03/29/23    History of Present Illness: Matthew Haskew. is a 83 y.o. male who presents for evaluation of left hand pain.  He was moving the trash cans a couple of days ago, when he fell.  He suffered abrasions to the right knee, left side of his face, as well as his left thumb.  The nail was almost completely avulsed.  He was evaluated in the ED.  He got antibiotics.  Laceration was sutured.  He also has a repair of the nailbed.  He has been taking antibiotics.  He is not taking pain medicines.   Review of Systems: No fevers or chills No numbness or tingling No chest pain No shortness of breath No bowel or bladder dysfunction No GI distress No headaches   Medical History:  Past Medical History:  Diagnosis Date   Arthritis    RA   Colon polyps    Depression    Dyslipidemia    Hypertension    LBBB (left bundle branch block)    Which comes and goes.   NICM (nonischemic cardiomyopathy) (HCC)    Echo 09/22/09, EF 45-50%   Rotator cuff tear    Shortness of breath    exertion    Past Surgical History:  Procedure Laterality Date   BACK SURGERY     BIOPSY  11/11/2021   Procedure: BIOPSY;  Surgeon: Dolores Frame, MD;  Location: [facility];  Service: Gastroenterology;;   CARDIAC CATHETERIZATION  09/22/09   Mild non-obstructive CAD   CARDIOVASCULAR STRESS TEST  06/23/2008    Exercise:  normal perfusion. Low risk.   COLONOSCOPY  10/12/2011   Procedure: COLONOSCOPY;  Surgeon: Malissa Hippo, MD;  Location: [facility];  Service: Endoscopy;  Laterality: N/A;  930   COLONOSCOPY N/A 11/10/2016   Procedure: COLONOSCOPY;  Surgeon: Malissa Hippo, MD;  Location: [facility];  Service: Endoscopy;  Laterality: N/A;  930   COLONOSCOPY WITH PROPOFOL N/A 11/11/2021   Procedure: COLONOSCOPY WITH PROPOFOL;  Surgeon: Dolores Frame, MD;  Location: [facility];  Service: Gastroenterology;  Laterality: N/A;  1030 ASA 3   EYE SURGERY     CATARACTS ; RIGHT EYE Jun 19, 2018, LEFT EYE JUNE 9TH, 2020 , SAW HIS EYE DOCTOR AFTER THE JUNE 9TH PROCEDURE   LEFT FOOT SURGERY   2015   POLYPECTOMY  11/10/2016   Procedure: POLYPECTOMY;  Surgeon: Malissa Hippo, MD;  Location: [facility];  Service: Endoscopy;;  colon   POLYPECTOMY  11/11/2021   Procedure: POLYPECTOMY;  Surgeon: Dolores Frame, MD;  Location: [facility];  Service: Gastroenterology;;   SHOULDER OPEN ROTATOR CUFF REPAIR Left 07/25/2018   Procedure: Left shoulder rotator cuff repair with graft;  Surgeon: Ranee Gosselin, MD;  Location: [facility];  Service: Orthopedics;  Laterality: Left;    TRANSTHORACIC ECHOCARDIOGRAM  09/22/09   EF 45-50%.  No significant valvular disease.     Family History  Problem Relation Age of Onset   Colon cancer Neg Hx    Social History   Tobacco Use   Smoking status: Former    Types: Cigarettes    Passive exposure: Current   Smokeless tobacco: Never  Vaping Use   Vaping status: Never Used  Substance Use Topics   Alcohol use: No   Drug use: No    No Known Allergies  Current Meds  Medication Sig   aspirin EC 81 MG tablet Take 1 tablet (81 mg total) by mouth daily.   carvedilol (COREG) 6.25 MG tablet Take 1 tablet (6.25 mg total) by mouth 2 (two) times daily.   cephALEXin (KEFLEX) 250 MG capsule Take 1 capsule (250 mg total) by mouth 4 (four) times  daily.   cholecalciferol (VITAMIN D3) 25 MCG (1000 UNIT) tablet Take 1,000 Units by mouth daily.   dorzolamide-timolol (COSOPT) 22.3-6.8 MG/ML ophthalmic solution Place 1 drop into both eyes 2 (two) times daily.   folic acid (FOLVITE) 1 MG tablet Take 1 mg by mouth daily.   latanoprost (XALATAN) 0.005 % ophthalmic solution Place 1 drop into both eyes at bedtime.    lisinopril (PRINIVIL,ZESTRIL) 40 MG tablet Take 40 mg by mouth daily.   Menthol, Topical Analgesic, (BIOFREEZE) 10 % CREA Apply 1 Application topically daily as needed (shoulder pain).   methotrexate (RHEUMATREX) 2.5 MG tablet Take 15 mg by mouth every Sunday.   multivitamin (ONE-A-DAY MEN'S) TABS tablet Take 1 tablet by mouth daily.   rosuvastatin (CRESTOR) 5 MG tablet Take 5 mg by mouth daily.    Objective: BP (!) 146/75   Pulse 62   Ht 5\' 9"  (1.753 m)   Wt 149 lb (67.6 kg)   BMI 22.00 kg/m   Physical Exam:  General: Elderly male., Alert and oriented., and No acute distress. Gait: Normal gait.  Evaluation of left thumb demonstrates laceration overlying the IP joint.  He also has a laceration on the ulnar side of the thumb.  No active drainage.  The nail is repositioned, overlying the nailbed laceration.           IMAGING: I personally reviewed images previously obtained from the ED  X-rays left thumb were obtained in the emergency department.  There is a small tuft avulsion fracture.   New Medications:  No orders of the defined types were placed in this encounter.     Oliver Barre, MD  04/28/2023 11:13 AM

## 2023-05-05 ENCOUNTER — Encounter: Payer: Self-pay | Admitting: Orthopedic Surgery

## 2023-05-05 ENCOUNTER — Ambulatory Visit (INDEPENDENT_AMBULATORY_CARE_PROVIDER_SITE_OTHER): Admitting: Orthopedic Surgery

## 2023-05-05 ENCOUNTER — Other Ambulatory Visit (INDEPENDENT_AMBULATORY_CARE_PROVIDER_SITE_OTHER): Payer: Self-pay

## 2023-05-05 DIAGNOSIS — S62522A Displaced fracture of distal phalanx of left thumb, initial encounter for closed fracture: Secondary | ICD-10-CM

## 2023-05-05 DIAGNOSIS — S61319D Laceration without foreign body of unspecified finger with damage to nail, subsequent encounter: Secondary | ICD-10-CM

## 2023-05-05 NOTE — Patient Instructions (Addendum)
 Daily dressing changes  Avoid excessive use of neosporin or similar ointment  Wash the thumb before dressing changes   Follow up in 1 week, can add at 1145 am

## 2023-05-06 NOTE — Progress Notes (Signed)
 Return patient Visit  Assessment: Matthew Weiss. is a 83 y.o. male with the following: 1. Laceration of nail bed of finger, subsequent encounter  Plan: Matthew Weiss. fell and sustained a laceration to his left thumb.  He is doing well.  Tissue appears healthy.  Sutures were removed.  Continue with regular dressing changes.  I recommended use of some soap and water.  Limit the use of Polysporin.  I would like see him back in approximately 1 week for repeat evaluation.   Follow-up: Return in about 1 week (around 05/12/2023).  Subjective:  Chief Complaint  Patient presents with   Fracture    L thumb DOI 03/29/23    History of Present Illness: Matthew Weiss. is a 83 y.o. male who returns for evaluation of left thumb laceration.  He sustained a laceration to the left nailbed.  This was irrigated and closed in the emergency department.  He denies pain.  He has been continue with dressing changes daily.  He states his daughter has been putting some Polysporin on the wounds.  He denies fevers or chills.  Pain is controlled.     Review of Systems: No fevers or chills No numbness or tingling No chest pain No shortness of breath No bowel or bladder dysfunction No GI distress No headaches      Objective: There were no vitals taken for this visit.  Physical Exam:  General: Elderly male., Alert and oriented., and No acute distress. Gait: Normal gait.  Lacerations of the left thumb are healthy appearing.  Sutures remain intact.  There is an exudate at the base of the wounds.  Sutures are ready to come out.  No active drainage or bleeding.  No purulence.  No redness.  Active motion of the IP joint is intact.  Nail remains in place.           IMAGING: I personally ordered and reviewed the following images  X-rays of the left thumb were obtained in clinic today.  There is a small tuft fracture of the distal phalanx.  Overall alignment is improved compared to previous  x-rays.  No additional injuries.  No dislocation.  Impression: Stable left thumb distal phalanx tuft fracture   New Medications:  No orders of the defined types were placed in this encounter.     Oliver Barre, MD  05/06/2023 3:34 PM

## 2023-05-12 ENCOUNTER — Ambulatory Visit: Admitting: Orthopedic Surgery

## 2023-05-12 ENCOUNTER — Encounter: Payer: Self-pay | Admitting: Orthopedic Surgery

## 2023-05-12 DIAGNOSIS — S62522A Displaced fracture of distal phalanx of left thumb, initial encounter for closed fracture: Secondary | ICD-10-CM

## 2023-05-12 DIAGNOSIS — S61319D Laceration without foreign body of unspecified finger with damage to nail, subsequent encounter: Secondary | ICD-10-CM

## 2023-05-12 NOTE — Progress Notes (Signed)
 Return patient Visit  Assessment: Matthew Knoke. is a 83 y.o. male with the following: 1. Laceration of nail bed of finger, subsequent encounter  Plan: Rafael Bihari. fell and sustained a laceration to his left thumb.  He is healing well.  Continue with dressing changes as needed.  Okay to thumb open to air.  The nail will eventually fall off.  Tip of the finger will remain sensitive, as the bone heals.  Plan to see him back in about 2 weeks for repeat evaluation.  Will get new x-rays at that time.  Follow-up: Return in about 12 days (around 05/24/2023).  Subjective:  Chief Complaint  Patient presents with   Follow-up    Left thumb laceration.     History of Present Illness: Matthew Zappia. is a 83 y.o. male who returns for evaluation of left thumb laceration.  His laceration is getting better.  Nailbed remains in place.  No fevers or chills.  He has continued dressing changes.  Pain overall is better.  Review of Systems: No fevers or chills No numbness or tingling No chest pain No shortness of breath No bowel or bladder dysfunction No GI distress No headaches      Objective: There were no vitals taken for this visit.  Physical Exam:  General: Elderly male., Alert and oriented., and No acute distress. Gait: Normal gait.  Left thumb wounds are healthy.  Dorsal thumb wound without drainage.  Mild tenderness to palpation distally.  Nailbed remains in place.  Numbness warm well-perfused.   IMAGING: I personally ordered and reviewed the following images  No new imaging obtained today.  New Medications:  No orders of the defined types were placed in this encounter.     Oliver Barre, MD  05/12/2023 11:42 AM

## 2023-05-24 ENCOUNTER — Ambulatory Visit: Admitting: Orthopedic Surgery

## 2023-05-24 ENCOUNTER — Encounter: Payer: Self-pay | Admitting: Orthopedic Surgery

## 2023-05-24 DIAGNOSIS — S61319D Laceration without foreign body of unspecified finger with damage to nail, subsequent encounter: Secondary | ICD-10-CM

## 2023-05-24 DIAGNOSIS — S62522A Displaced fracture of distal phalanx of left thumb, initial encounter for closed fracture: Secondary | ICD-10-CM

## 2023-05-24 NOTE — Progress Notes (Signed)
 Return patient Visit  Assessment: Matthew Weiss. is a 83 y.o. male with the following: 1. Laceration of nail bed of finger, subsequent encounter  Plan: Matthew Weiss. fell and sustained a laceration to his left thumb.  Laceration is healing well.  Nail was removed in clinic today.  Underlying nailbed appears healthy.  There was a small amount of serous drainage.  There appeared to be a new nail developing within the nail fold.  At this point, he is comfortable monitoring his wounds.  Dressing as needed.  If he has any issues, he will return to clinic.  Follow-up: Return if symptoms worsen or fail to improve.  Subjective:  Chief Complaint  Patient presents with   Wound Check    L thumb DOI 03/29/23    History of Present Illness: Matthew Weiss. is a 83 y.o. male who returns for evaluation of left thumb laceration.  It is healing appropriately.  No fevers or chills.  Minimal discomfort.  He has not been using a dressing.  He has left the nail in place.   Review of Systems: No fevers or chills No numbness or tingling No chest pain No shortness of breath No bowel or bladder dysfunction No GI distress No headaches      Objective: There were no vitals taken for this visit.  Physical Exam:  General: Elderly male., Alert and oriented., and No acute distress. Gait: Normal gait.  Laceration to left thumb are healing.  There is some peeling skin.  Nail was removed.  Laceration of the nailbed is intact.  Small amount of serous drainage.  New nail is forming.  He is able to flex and extend the IP joint.  No tenderness to palpation.   IMAGING: I personally ordered and reviewed the following images  No new imaging obtained today.  New Medications:  No orders of the defined types were placed in this encounter.     Tonita Frater, MD  05/24/2023 11:26 AM

## 2023-07-25 DIAGNOSIS — L508 Other urticaria: Secondary | ICD-10-CM | POA: Diagnosis not present

## 2023-08-10 DIAGNOSIS — L508 Other urticaria: Secondary | ICD-10-CM | POA: Diagnosis not present

## 2023-09-05 DIAGNOSIS — D649 Anemia, unspecified: Secondary | ICD-10-CM | POA: Diagnosis not present

## 2023-09-07 DIAGNOSIS — Z961 Presence of intraocular lens: Secondary | ICD-10-CM | POA: Diagnosis not present

## 2023-09-07 DIAGNOSIS — H401131 Primary open-angle glaucoma, bilateral, mild stage: Secondary | ICD-10-CM | POA: Diagnosis not present

## 2023-09-19 ENCOUNTER — Ambulatory Visit: Attending: Cardiovascular Disease | Admitting: Cardiovascular Disease

## 2023-09-19 VITALS — BP 120/62 | HR 64 | Ht 69.0 in | Wt 144.0 lb

## 2023-09-19 DIAGNOSIS — I428 Other cardiomyopathies: Secondary | ICD-10-CM

## 2023-09-19 DIAGNOSIS — E7800 Pure hypercholesterolemia, unspecified: Secondary | ICD-10-CM | POA: Diagnosis not present

## 2023-09-19 DIAGNOSIS — I10 Essential (primary) hypertension: Secondary | ICD-10-CM

## 2023-09-19 DIAGNOSIS — I447 Left bundle-branch block, unspecified: Secondary | ICD-10-CM | POA: Diagnosis not present

## 2023-09-19 MED ORDER — CARVEDILOL 3.125 MG PO TABS: 3.1250 mg | ORAL_TABLET | Freq: Two times a day (BID) | ORAL | 3 refills | Status: AC

## 2023-09-19 NOTE — Patient Instructions (Signed)
 Medication Instructions:  Decrease Carvedilol  6.25 mg take 1/2 tablet twice a day then start New Carvedilol  3.125 mg take 1 tablet twice a day Continue all other medications *If you need a refill on your cardiac medications before your next appointment, please call your pharmacy*  Lab Work: None ordered  Testing/Procedures: None ordered  Follow-Up: At [facility], you and your health needs are our priority.  As part of our continuing mission to provide you with exceptional heart care, our providers are all part of one team.  This team includes your primary Cardiologist (physician) and Advanced Practice Providers or APPs (Physician Assistants and Nurse Practitioners) who all work together to provide you with the care you need, when you need it.  Your next appointment:  1 year   Call in April to schedule August appointment     Provider:  Dr.Croitoru    We recommend signing up for the patient portal called MyChart.  Sign up information is provided on this After Visit Summary.  MyChart is used to connect with patients for Virtual Visits (Telemedicine).  Patients are able to view lab/test results, encounter notes, upcoming appointments, etc.  Non-urgent messages can be sent to your provider as well.   To learn more about what you can do with MyChart, go to [email or web address].

## 2023-09-21 ENCOUNTER — Encounter: Payer: Self-pay | Admitting: Cardiovascular Disease

## 2023-09-21 NOTE — Progress Notes (Signed)
 Cardiology Office Note    Date:  09/21/2023   ID:  Matthew Brickle., DOB Jun 23, 1940, MRN [id number]  PCP:  Shona Norleen PEDLAR, MD  Cardiologist:   Jerel Balding, MD   Chief Complaint  Patient presents with   Cardiomyopathy    History of Present Illness:  Matthew Kai. is a 83 y.o. male with mild nonischemic cardiomyopathy, rate related left bundle branch block, prediabetes, hypercholesterolemia,hypertension returning for follow-up.   From a cardiac point of view he has had an uneventful year.  He continues to walk on a daily basis at a brisk pace.  He will alternate this with riding a stationary bicycle.  He denies any problems with chest pain or shortness of breath with activity.  He does complain of lower energy levels, but he has not had dizziness, palpitations or syncope.  He denies lower extremity edema, claudication or focal neurological complaints.  Although he has residual stiffness in his hands, his rheumatoid arthritis does not appear to have progressed since last year.  At initial presentation he had a rate related left bundle branch block, but now this appears to be present even at slower heart rates, 64 bpm today.  PR interval has also lengthened.  Since he has not had any symptoms to suggest heart failure we have not reevaluated left ventricular systolic function since 2011 when his echo showed LVEF 45-50% with mild global hypokinesis and in 2010 he had a normal radioisotope perfusion pattern on a nuclear treadmill stress test (he was able to exercise for 13 METS).     Past Medical History:  Diagnosis Date   Arthritis    RA   Colon polyps    Depression    Dyslipidemia    Hypertension    LBBB (left bundle branch block)    Which comes and goes.   NICM (nonischemic cardiomyopathy) (HCC)    Echo 09/22/09, EF 45-50%   Rotator cuff tear    Shortness of breath    exertion    Past Surgical History:  Procedure Laterality Date   BACK SURGERY     BIOPSY  11/11/2021    Procedure: BIOPSY;  Surgeon: Eartha Angelia Sieving, MD;  [facility]: [facility];  Service: Gastroenterology;;   CARDIAC CATHETERIZATION  09/22/09   Mild non-obstructive CAD   CARDIOVASCULAR STRESS TEST  06/23/2008   Exercise:  normal perfusion. Low risk.   COLONOSCOPY  10/12/2011   Procedure: COLONOSCOPY;  Surgeon: Claudis RAYMOND Rivet, MD;  [facility];  Service: Endoscopy;  Laterality: N/A;  930   COLONOSCOPY N/A 11/10/2016   Procedure: COLONOSCOPY;  Surgeon: Rivet Claudis RAYMOND, MD;  [facility];  Service: Endoscopy;  Laterality: N/A;  930   COLONOSCOPY WITH PROPOFOL  N/A 11/11/2021   Procedure: COLONOSCOPY WITH PROPOFOL ;  Surgeon: Eartha Angelia Sieving, MD;  [facility];  Service: Gastroenterology;  Laterality: N/A;  1030 ASA 3   EYE SURGERY     CATARACTS ; RIGHT EYE Jun 19, 2018, LEFT EYE JUNE 9TH, 2020 , SAW HIS EYE DOCTOR AFTER THE JUNE 9TH PROCEDURE   LEFT FOOT SURGERY   2015   POLYPECTOMY  11/10/2016   Procedure: POLYPECTOMY;  Surgeon: Rivet Claudis RAYMOND, MD;  [facility];  Service: Endoscopy;;  colon   POLYPECTOMY  11/11/2021   Procedure: POLYPECTOMY;  Surgeon: Eartha Angelia Sieving, MD;  [facility];  Service: Gastroenterology;;   SHOULDER OPEN ROTATOR CUFF REPAIR Left 07/25/2018   Procedure: Left shoulder rotator  cuff repair with graft;  Surgeon: Heide Ingle, MD;  Location: [facility];  Service: Orthopedics;  Laterality: Left;    TRANSTHORACIC ECHOCARDIOGRAM  09/22/09   EF 45-50%.  No significant valvular disease.     Current Medications: Outpatient Medications Prior to Visit  Medication Sig Dispense Refill   amLODipine  (NORVASC ) 5 MG tablet Take 1 tablet (5 mg total) by mouth daily. 180 tablet 3   aspirin EC 81 MG tablet Take 1 tablet (81 mg total) by mouth daily.     cholecalciferol (VITAMIN D3) 25 MCG (1000 UNIT) tablet Take 1,000 Units by mouth daily.     dorzolamide -timolol  (COSOPT ) 22.3-6.8 MG/ML ophthalmic  solution Place 1 drop into both eyes 2 (two) times daily.     folic acid (FOLVITE) 1 MG tablet Take 1 mg by mouth daily.     latanoprost  (XALATAN ) 0.005 % ophthalmic solution Place 1 drop into both eyes at bedtime.      lisinopril  (PRINIVIL ,ZESTRIL ) 40 MG tablet Take 40 mg by mouth daily.     Menthol , Topical Analgesic, (BIOFREEZE) 10 % CREA Apply 1 Application topically daily as needed (shoulder pain).     methotrexate  (RHEUMATREX) 2.5 MG tablet Take 15 mg by mouth every Sunday.     multivitamin (ONE-A-DAY MEN'S) TABS tablet Take 1 tablet by mouth daily.     rosuvastatin (CRESTOR) 5 MG tablet Take 5 mg by mouth daily.     carvedilol  (COREG ) 6.25 MG tablet Take 1 tablet (6.25 mg total) by mouth 2 (two) times daily. 60 tablet 11   cephALEXin  (KEFLEX ) 250 MG capsule Take 1 capsule (250 mg total) by mouth 4 (four) times daily. (Patient not taking: Reported on 09/19/2023) 28 capsule 0   No facility-administered medications prior to visit.     Allergies:   Patient has no known allergies.   Social History   Socioeconomic History   Marital status: Married    Spouse name: Not on file   Number of children: Not on file   Years of education: Not on file   Highest education level: Not on file  Occupational History   Not on file  Tobacco Use   Smoking status: Former    Types: Cigarettes    Passive exposure: Current   Smokeless tobacco: Never  Vaping Use   Vaping status: Never Used  Substance and Sexual Activity   Alcohol use: No   Drug use: No   Sexual activity: Not Currently  Other Topics Concern   Not on file  Social History Narrative   Not on file   Social Drivers of Health   Financial Resource Strain: Not on file  Food Insecurity: Not on file  Transportation Needs: Not on file  Physical Activity: Not on file  Stress: Not on file  Social Connections: Not on file      ROS:   Please see the history of present illness.    ROS All other systems are reviewed and are  negative.   PHYSICAL EXAM:   VS:  BP 120/62 (BP Location: Left Arm, Cuff Size: Normal)   Pulse 64   Ht 5' 9 (1.753 m)   Wt 144 lb (65.3 kg)   BMI 21.27 kg/m      General: Alert, oriented x3, no distress, appears well Head: no evidence of trauma, PERRL, EOMI, no exophtalmos or lid lag, no myxedema, no xanthelasma; normal ears, nose and oropharynx Neck: normal jugular venous pulsations and no hepatojugular reflux; brisk carotid pulses without delay and no  carotid bruits Chest: clear to auscultation, no signs of consolidation by percussion or palpation, normal fremitus, symmetrical and full respiratory excursions Cardiovascular: normal position and quality of the apical impulse, regular rhythm, normal first and paradoxically split second heart sounds, no murmurs, rubs or gallops Abdomen: no tenderness or distention, no masses by palpation, no abnormal pulsatility or arterial bruits, normal bowel sounds, no hepatosplenomegaly Extremities: no clubbing, cyanosis or edema; 2+ radial, ulnar and brachial pulses bilaterally; 2+ right femoral, posterior tibial and dorsalis pedis pulses; 2+ left femoral, posterior tibial and dorsalis pedis pulses; no subclavian or femoral bruits Neurological: grossly nonfocal Psych: Normal mood and affect    Wt Readings from Last 3 Encounters:  09/19/23 144 lb (65.3 kg)  04/28/23 149 lb (67.6 kg)  04/26/23 152 lb 1.9 oz (69 kg)    Studies/Labs Reviewed:   EKG:    EKG Interpretation Date/Time:  Tuesday September 19 2023 08:30:29 EDT Ventricular Rate:  64 PR Interval:  208 QRS Duration:  148 QT Interval:  440 QTC Calculation: 453 R Axis:   -32  Text Interpretation: Normal sinus rhythm Left axis deviation Left bundle branch block When compared with ECG of 09-Sep-2022 09:09, QRS axis Shifted left Confirmed by Ivelisse Culverhouse ([id number]) on 09/19/2023 8:53:37 AM         Recent Labs: No results found for requested labs within last 365 days.  12/31/2020  hemoglobin 13.3, creatinine 0.9, potassium 3.9, ALT 13 Hemoglobin A1c 6.1%  06/03/2022   hemoglobin A1c 6.2%, hemoglobin 13.1, creatinine 0.95, potassium 4.1, ALT 50  08/10/2023 Hemoglobin 12.2, creatinine 0.87, potassium 4.5, ALT 14, TSH 1.700   Lipid Panel    Component Value Date/Time   CHOL  09/23/2009 0406    109        ATP III CLASSIFICATION:  <200     mg/dL   Desirable  799-760  mg/dL   Borderline High  >=759    mg/dL   High          TRIG 28 09/23/2009 0406   HDL 45 09/23/2009 0406   CHOLHDL 2.4 09/23/2009 0406   VLDL 6 09/23/2009 0406   LDLCALC  09/23/2009 0406    58        Total Cholesterol/HDL:CHD Risk Coronary Heart Disease Risk Table                     Men   Women  1/2 Average Risk   3.4   3.3  Average Risk       5.0   4.4  2 X Average Risk   9.6   7.1  3 X Average Risk  23.4   11.0        Use the calculated Patient Ratio above and the CHD Risk Table to determine the patient's CHD Risk.        ATP III CLASSIFICATION (LDL):  <100     mg/dL   Optimal  899-870  mg/dL   Near or Above                    Optimal  130-159  mg/dL   Borderline  839-810  mg/dL   High  >809     mg/dL   Very High   88/69/7979 total cholesterol 135, HDL 60, triglycerides 47  87/79/7978 total cholesterol 139, HDL 60, triglycerides 73  87/09/7975 total cholesterol 124, HDL 55, LDL 68, triglycerides 49  94/96/7975 total cholesterol 121, HDL 59, LDL 49, triglycerides 57  96/93/7974  total cholesterol 124, HDL 54, LDL 57, triglycerides 60 Hemoglobin A1c 6.2%  ASSESSMENT:    1. NICM (nonischemic cardiomyopathy) (HCC)   2. Essential hypertension   3. LBBB (left bundle branch block)   4. Hypercholesterolemia       PLAN:  In order of problems listed above:  CMP: He remains completely asymptomatic, NYHA functional class I, euvolemic without diuretics, active lifestyle.   Reduction in LVEF can be mostly attributed to left ventricular dyssynchrony due to LBBB.  On beta-blocker  and ACE inhibitor.   At this point there seems to be no reason to escalate therapy by adding an SGLT2 inhibitor or aldosterone antagonist or by switching to Entresto.  Similarly CRT is not indicated at this time.   HTN: Excellent control. LBBB: This was initially a rate related abnormality but is now seen at slow heart rates and he has an accompanying prolonged PR interval.  Will decrease the dose of his beta-blocker.  Discussed the fact that he may progress to high-grade AV block and might eventually need a pacemaker. HLP: All lipid parameters in target range.  Continue the same statin prescription. RA: Good symptomatic control on methotrexate  with normal recent liver function tests  Medication Adjustments/Labs and Tests Ordered: Current medicines are reviewed at length with the patient today.  Concerns regarding medicines are outlined above.  Medication changes, Labs and Tests ordered today are listed in the Patient Instructions below. Patient Instructions  Medication Instructions:  Decrease Carvedilol  6.25 mg take 1/2 tablet twice a day then start New Carvedilol  3.125 mg take 1 tablet twice a day Continue all other medications *If you need a refill on your cardiac medications before your next appointment, please call your pharmacy*  Lab Work: None ordered  Testing/Procedures: None ordered  Follow-Up: At [facility], you and your health needs are our priority.  As part of our continuing mission to provide you with exceptional heart care, our providers are all part of one team.  This team includes your primary Cardiologist (physician) and Advanced Practice Providers or APPs (Physician Assistants and Nurse Practitioners) who all work together to provide you with the care you need, when you need it.  Your next appointment:  1 year   Call in April to schedule August appointment     Provider:  Dr.Basel Defalco    We recommend signing up for the patient portal called MyChart.  Sign up  information is provided on this After Visit Summary.  MyChart is used to connect with patients for Virtual Visits (Telemedicine).  Patients are able to view lab/test results, encounter notes, upcoming appointments, etc.  Non-urgent messages can be sent to your provider as well.   To learn more about what you can do with MyChart, go to [email or web address].          Signed, Jerel Balding, MD  09/21/2023 7:06 PM    [facility] [address] [place], [place], [place]  [address] Phone: [phone]; Fax: [phone]

## 2023-10-06 DIAGNOSIS — E1169 Type 2 diabetes mellitus with other specified complication: Secondary | ICD-10-CM | POA: Diagnosis not present

## 2023-10-06 DIAGNOSIS — E782 Mixed hyperlipidemia: Secondary | ICD-10-CM | POA: Diagnosis not present

## 2023-10-07 LAB — LAB REPORT - SCANNED
A1c: 5.9
Albumin, Urine POC: 26.2
Albumin/Creatinine Ratio, Urine, POC: 38
Creatinine, POC: 69.5 mg/dL
EGFR: 85

## 2023-10-12 DIAGNOSIS — I447 Left bundle-branch block, unspecified: Secondary | ICD-10-CM | POA: Diagnosis not present

## 2023-10-12 DIAGNOSIS — E782 Mixed hyperlipidemia: Secondary | ICD-10-CM | POA: Diagnosis not present

## 2023-10-12 DIAGNOSIS — L508 Other urticaria: Secondary | ICD-10-CM | POA: Diagnosis not present

## 2023-10-12 DIAGNOSIS — Z23 Encounter for immunization: Secondary | ICD-10-CM | POA: Diagnosis not present

## 2023-10-12 DIAGNOSIS — I10 Essential (primary) hypertension: Secondary | ICD-10-CM | POA: Diagnosis not present

## 2023-10-12 DIAGNOSIS — J309 Allergic rhinitis, unspecified: Secondary | ICD-10-CM | POA: Diagnosis not present

## 2023-10-12 DIAGNOSIS — M25512 Pain in left shoulder: Secondary | ICD-10-CM | POA: Diagnosis not present

## 2023-10-12 DIAGNOSIS — I420 Dilated cardiomyopathy: Secondary | ICD-10-CM | POA: Diagnosis not present

## 2023-10-12 DIAGNOSIS — H409 Unspecified glaucoma: Secondary | ICD-10-CM | POA: Diagnosis not present

## 2023-10-12 DIAGNOSIS — D649 Anemia, unspecified: Secondary | ICD-10-CM | POA: Diagnosis not present

## 2023-10-12 DIAGNOSIS — E1169 Type 2 diabetes mellitus with other specified complication: Secondary | ICD-10-CM | POA: Diagnosis not present

## 2023-10-12 DIAGNOSIS — M069 Rheumatoid arthritis, unspecified: Secondary | ICD-10-CM | POA: Diagnosis not present

## 2023-10-16 ENCOUNTER — Ambulatory Visit (INDEPENDENT_AMBULATORY_CARE_PROVIDER_SITE_OTHER): Admitting: Internal Medicine

## 2023-10-16 ENCOUNTER — Other Ambulatory Visit: Payer: Self-pay

## 2023-10-16 VITALS — BP 120/70 | HR 82 | Temp 98.3°F | Resp 18 | Ht 65.75 in | Wt 144.6 lb

## 2023-10-16 DIAGNOSIS — J3089 Other allergic rhinitis: Secondary | ICD-10-CM

## 2023-10-16 DIAGNOSIS — L500 Allergic urticaria: Secondary | ICD-10-CM | POA: Diagnosis not present

## 2023-10-16 MED ORDER — FLUTICASONE PROPIONATE 50 MCG/ACT NA SUSP: 2.0000 | Freq: Every day | NASAL | 5 refills | Status: DC

## 2023-10-16 MED ORDER — CETIRIZINE HCL 10 MG PO TABS: 10.0000 mg | ORAL_TABLET | Freq: Two times a day (BID) | ORAL | 5 refills | Status: DC | PRN

## 2023-10-16 NOTE — Patient Instructions (Addendum)
 Other Allergic Rhinitis:  Rashes: - Possibly urticaria/hives, please take pictures of the rashes.   - At this time etiology of hives and swelling is unknown. Hives can be caused by a variety of different triggers including illness/infection, pressure, vibrations, extremes of temperature to name a few however majority of the time there is no identifiable trigger.   Hold all anti-histamines (Xyzal, Allegra, Zyrtec , Claritin, Benadryl, Pepcid) 3 days prior to next visit.  Follow up: 9/22 at 830 for skin testing 1-68

## 2023-10-16 NOTE — Progress Notes (Signed)
 NEW PATIENT  Date of Service/Encounter:  10/16/23  Consult requested by: Shona Norleen PEDLAR, MD   Subjective:   Matthew Weiss. (DOB: 1940-03-10) is a 83 y.o. male who presents to the clinic on 10/16/2023 with a chief complaint of Allergies (Running nose , stuffy head) .    History obtained from: chart review and patient.   Rhinitis:  Started many years ago.   Symptoms include: nasal congestion, rhinorrhea, and post nasal drainage  Occurs year-round Potential triggers: not sure  Treatments tried:  PRN anti histamine   Previous allergy testing: no History of sinus surgery: no Nonallergic triggers: eating     Rashes: Started a few months.  They come and go, very itchy.   Triamcinolone helps, not sure if benadryl does.  No scaring, no pain No pictures but thinks his daughter took some  No triggers noted, denies any changes in medications.   Reviewed:  08/10/2023: seen by PCP for acute urticaria, referred to Allergy. Previously started on hydroxyzine PRN and triamcinolone PRN.  04/11/2023: followed by Dr Shona for rheumatoid arthritis, on methotrexate  and folic acid .  09/19/2023: followed by cardiology for NICM, LBB, HTN, HLD, pre DM.  Doing well overall.  On beta blocker, ACE=I.     Past Medical History: Past Medical History:  Diagnosis Date   Arthritis    RA   Colon polyps    Depression    Dyslipidemia    Hypertension    LBBB (left bundle branch block)    Which comes and goes.   NICM (nonischemic cardiomyopathy) (HCC)    Echo 09/22/09, EF 45-50%   Rotator cuff tear    Shortness of breath    exertion   Past Surgical History: Past Surgical History:  Procedure Laterality Date   BACK SURGERY     BIOPSY  11/11/2021   Procedure: BIOPSY;  Surgeon: Eartha Angelia Sieving, MD;  [facility]: [facility];  Service: Gastroenterology;;   CARDIAC CATHETERIZATION  09/22/09   Mild non-obstructive CAD   CARDIOVASCULAR STRESS TEST  06/23/2008   Exercise:  normal perfusion. Low  risk.   COLONOSCOPY  10/12/2011   Procedure: COLONOSCOPY;  Surgeon: Claudis RAYMOND Rivet, MD;  [facility];  Service: Endoscopy;  Laterality: N/A;  930   COLONOSCOPY N/A 11/10/2016   Procedure: COLONOSCOPY;  Surgeon: Rivet Claudis RAYMOND, MD;  [facility];  Service: Endoscopy;  Laterality: N/A;  930   COLONOSCOPY WITH PROPOFOL  N/A 11/11/2021   Procedure: COLONOSCOPY WITH PROPOFOL ;  Surgeon: Eartha Angelia Sieving, MD;  [facility];  Service: Gastroenterology;  Laterality: N/A;  1030 ASA 3   EYE SURGERY     CATARACTS ; RIGHT EYE Jun 19, 2018, LEFT EYE JUNE 9TH, 2020 , SAW HIS EYE DOCTOR AFTER THE JUNE 9TH PROCEDURE   LEFT FOOT SURGERY   2015   POLYPECTOMY  11/10/2016   Procedure: POLYPECTOMY;  Surgeon: Rivet Claudis RAYMOND, MD;  [facility];  Service: Endoscopy;;  colon   POLYPECTOMY  11/11/2021   Procedure: POLYPECTOMY;  Surgeon: Eartha Angelia Sieving, MD;  [facility];  Service: Gastroenterology;;   SHOULDER OPEN ROTATOR CUFF REPAIR Left 07/25/2018   Procedure: Left shoulder rotator cuff repair with graft;  Surgeon: Heide Ingle, MD;  Location: [facility];  Service: Orthopedics;  Laterality: Left;    TRANSTHORACIC ECHOCARDIOGRAM  09/22/09   EF 45-50%.  No significant valvular disease.     Family History: Family History  Problem Relation Age of Onset  Colon cancer Neg Hx     Social History:  Flooring in bedroom: wood Pets: none Tobacco use/exposure: none Job: retired   Medication List:  Allergies as of 10/16/2023   No Known Allergies      Medication List        Accurate as of October 16, 2023 10:05 AM. If you have any questions, ask your nurse or doctor.          amLODipine  5 MG tablet Commonly known as: NORVASC  Take 1 tablet (5 mg total) by mouth daily.   aspirin EC 81 MG tablet Take 1 tablet (81 mg total) by mouth daily.   Biofreeze 10 % Crea Generic drug: Menthol  (Topical Analgesic) Apply 1  Application topically daily as needed (shoulder pain).   carvedilol  3.125 MG tablet Commonly known as: Coreg  Take 1 tablet (3.125 mg total) by mouth 2 (two) times daily with a meal.   cephALEXin  250 MG capsule Commonly known as: KEFLEX  Take 1 capsule (250 mg total) by mouth 4 (four) times daily.   cholecalciferol 25 MCG (1000 UNIT) tablet Commonly known as: VITAMIN D3 Take 1,000 Units by mouth daily.   dorzolamide -timolol  2-0.5 % ophthalmic solution Commonly known as: COSOPT  Place 1 drop into both eyes 2 (two) times daily.   folic acid  1 MG tablet Commonly known as: FOLVITE  Take 1 mg by mouth daily.   latanoprost  0.005 % ophthalmic solution Commonly known as: XALATAN  Place 1 drop into both eyes at bedtime.   lisinopril  40 MG tablet Commonly known as: ZESTRIL  Take 40 mg by mouth daily.   methotrexate  2.5 MG tablet Commonly known as: RHEUMATREX Take 15 mg by mouth every Sunday.   multivitamin Tabs tablet Take 1 tablet by mouth daily.   rosuvastatin 5 MG tablet Commonly known as: CRESTOR Take 5 mg by mouth daily.         REVIEW OF SYSTEMS: Pertinent positives and negatives discussed in HPI.   Objective:   Physical Exam: BP 120/70 (BP Location: Left Arm, Patient Position: Sitting, Cuff Size: Normal)   Pulse 82   Temp 98.3 F (36.8 C) (Temporal)   Resp 18   Ht 5' 5.75 (1.67 m)   Wt 144 lb 9.6 oz (65.6 kg)   SpO2 99%   BMI 23.52 kg/m  Body mass index is 23.52 kg/m. GEN: alert, well developed HEENT: clear conjunctiva, nose with + mild inferior turbinate hypertrophy, pink nasal mucosa, slight clear rhinorrhea, + cobblestoning HEART: regular rate and rhythm, no murmur LUNGS: clear to auscultation bilaterally, no coughing, unlabored respiration ABDOMEN: soft, non distended  SKIN: no rashes or lesions  Assessment:   1. Allergic urticaria   2. Other allergic rhinitis     Plan/Recommendations:  Other Allergic Rhinitis: - Due to turbinate hypertrophy,  recurrent hives and unresponsive to over the counter meds, will perform skin testing to identify aeroallergen triggers.   - Use nasal saline rinses before nose sprays such as with Neilmed Sinus Rinse.  Use distilled water .   - Use Flonase  2 sprays each nostril daily. Aim upward and outward.  Rashes: - Possibly urticaria, please take pictures of the rashes.   - At this time etiology of hives and swelling is unknown. Hives can be caused by a variety of different triggers including illness/infection, pressure, vibrations, extremes of temperature to name a few however majority of the time there is no identifiable trigger.  -Start Zyrtec  10mg  daily.   -If no improvement in 2-3 days, increase to Zyrtec  10mg  twice daily.  Hold all anti-histamines (Xyzal, Allegra, Zyrtec , Claritin, Benadryl, Pepcid) 3 days prior to next visit.  Follow up: 9/22 at 830 for skin testing 1-68, IDs okay     Arleta Blanch, MD [facility] 

## 2023-10-23 ENCOUNTER — Ambulatory Visit (INDEPENDENT_AMBULATORY_CARE_PROVIDER_SITE_OTHER): Admitting: Internal Medicine

## 2023-10-23 DIAGNOSIS — L500 Allergic urticaria: Secondary | ICD-10-CM

## 2023-10-23 DIAGNOSIS — J3089 Other allergic rhinitis: Secondary | ICD-10-CM

## 2023-10-23 NOTE — Progress Notes (Signed)
 FOLLOW UP Date of Service/Encounter:  10/23/23   Subjective:  Matthew Weiss. (DOB: [birth year]) is a 83 y.o. male who [facility] for follow up for skin testing.   History obtained from: chart review and patient.  Anti histamines held.  Has pictures that show clusters of hives.    Past Medical History: Past Medical History:  Diagnosis Date   Arthritis    RA   Colon polyps    Depression    Dyslipidemia    Hypertension    LBBB (left bundle branch block)    Which comes and goes.   NICM (nonischemic cardiomyopathy) (HCC)    Echo 09/22/09, EF 45-50%   Rotator cuff tear    Shortness of breath    exertion    Objective:  There were no vitals taken for this visit. There is no height or weight on file to calculate BMI. Physical Exam: GEN: alert, well developed HEENT: clear conjunctiva, MMM LUNGS: unlabored respiration  Skin Testing:  Skin prick testing was placed, which includes aeroallergens/foods, histamine control, and saline control.  Verbal consent was obtained prior to placing test.  Patient tolerated procedure well.  Allergy  testing results were read and interpreted by myself, documented by clinical staff. Difficult to interpret due to dermatographism.  Positive results to:  Results discussed with patient/family.  Airborne Adult Perc - 10/23/23 0835     Time Antigen Placed 0835    Allergen Manufacturer Jestine    Location Back    Number of Test -54    1. Control-Buffer 50% Glycerol Negative    2. Control-Histamine 3+    3. Bahia Negative    4. French Southern Territories Negative    5. Johnson Negative    6. Kentucky  Blue Negative    7. Meadow Fescue Negative    8. Perennial Rye Negative    9. Timothy Negative    10. Ragweed Mix Negative    11. Cocklebur Negative    12. Plantain,  English Negative    13. Baccharis Negative    14. Dog Fennel Negative    15. Russian Thistle Negative    16. Lamb's Quarters Negative    17. Sheep  Sorrell Negative    18. Rough Pigweed Negative    19. Marsh Elder, Rough Negative    20. Mugwort, Common Negative    21. Box, Elder Negative    22. Cedar, red Negative    23. Sweet Gum Negative    24. Pecan Pollen Negative    25. Pine Mix Negative    26. Walnut, Black Pollen Negative    27. Red Mulberry Negative    28. Ash Mix Negative    29. Birch Mix Negative    30. Beech American Negative    31. Cottonwood, Guinea-Bissau Negative    32. Hickory, White Negative    33. Maple Mix Negative    34. Oak, Guinea[place] Mix Negative    35. Sycamore Eastern Negative    36. Alternaria Alternata Negative    37. Cladosporium Herbarum Negative    38. Aspergillus Mix Negative    39. Penicillium Mix Negative    40. Bipolaris Sorokiniana (Helminthosporium) Negative    41. Drechslera Spicifera (Curvularia) Negative    42. Mucor Plumbeus Negative    43. Fusarium Moniliforme Negative    44. Aureobasidium Pullulans (pullulara) Negative    45. Rhizopus Oryzae Negative    46. Botrytis Cinera Negative    47. Epicoccum Nigrum Negative  48. Phoma Betae Negative    49. Dust Mite Mix Negative    50. Cat Hair 10,000 BAU/ml Negative    51.  Dog Epithelia Negative    52. Mixed Feathers Negative    53. Horse Epithelia Negative    54. Cockroach, German Negative    55. Tobacco Leaf Negative          13 Food Perc - 10/23/23 0837       Test Information   Time Antigen Placed 9162    Allergen Manufacturer Jestine    Location Back    Number of allergen test 13    Food Select      Food   1. Peanut Negative    2. Soybean Negative    3. Wheat Negative    4. Sesame Negative    5. Milk, Cow Negative    6. Casein Negative    7. Egg White, Chicken Negative    8. Shellfish Mix Negative    9. Fish Mix Negative    10. Cashew Negative    11. Walnut Food Negative    12. Almond Negative    13. Hazelnut Negative           Assessment:   1. Other allergic rhinitis   2. Allergic urticaria      Plan/Recommendations:  Other Allergic Rhinitis: - Due to turbinate hypertrophy, recurrent hives and unresponsive to over the counter meds, will perform skin testing to identify aeroallergen triggers.   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  - Use nasal saline rinses before nose sprays such as with Neilmed Sinus Rinse.  Use distilled water .   - Use Flonase  2 sprays each nostril daily. Aim upward and outward. - Use Zyrtec  10 mg daily.     Urticaria (Hives)  - SPT 10/2023: negative to commonly allergenic foods  - At this time etiology of hives and swelling is unknown. Hives can be caused by a variety of different triggers including illness/infection, pressure, vibrations, extremes of temperature to name a few however majority of the time there is no identifiable trigger.  -Start Zyrtec  (Cetirizine ) 10mg  daily.   -If no improvement in 3 days, increase to Zyrtec  10mg  twice daily.      Return in about 2 months (around 12/23/2023).  Arleta Blanch, MD [facility] 

## 2023-10-23 NOTE — Patient Instructions (Addendum)
 Other Allergic Rhinitis: - Positive skin test 10/2023: none; will obtain blood testing - Use nasal saline rinses before nose sprays such as with Neilmed Sinus Rinse.  Use distilled water .   - Use Flonase  2 sprays each nostril daily. Aim upward and outward. - Use Zyrtec  10 mg daily.     Urticaria (Hives)  - At this time etiology of hives and swelling is unknown. Hives can be caused by a variety of different triggers including illness/infection, pressure, vibrations, extremes of temperature to name a few however majority of the time there is no identifiable trigger.  -Start Zyrtec  (Cetirizine ) 10mg  daily.   -If no improvement in 3 days, increase to Zyrtec  10mg  twice daily.

## 2023-10-25 ENCOUNTER — Ambulatory Visit: Payer: Self-pay | Admitting: Internal Medicine

## 2023-10-25 LAB — ALLERGENS W/TOTAL IGE AREA 2
Alternaria Alternata IgE: <0.1 kU/L
Aspergillus Fumigatus IgE: 0.32 kU/L — AB
Bermuda Grass IgE: <0.1 kU/L
Cat Dander IgE: 1.31 kU/L — AB
Cedar, Mountain IgE: <0.1 kU/L
Cladosporium Herbarum IgE: 0.22 kU/L — AB
Cockroach, German IgE: 0.1 kU/L — AB
Common Silver Birch IgE: <0.1 kU/L
Cottonwood IgE: <0.1 kU/L
D Farinae IgE: 0.16 kU/L — AB
D Pteronyssinus IgE: 0.17 kU/L — AB
Dog Dander IgE: 1.49 kU/L — AB
Elm, American IgE: <0.1 kU/L
IgE (Immunoglobulin E), Serum: 52 [IU]/mL (ref 6–495)
Johnson Grass IgE: <0.1 kU/L
Maple/Box Elder IgE: <0.1 kU/L
Mouse Urine IgE: <0.1 kU/L
Oak, White IgE: <0.1 kU/L
Pecan, Hickory IgE: <0.1 kU/L
Penicillium Chrysogen IgE: <0.1 kU/L
Pigweed, Rough IgE: <0.1 kU/L
Ragweed, Short IgE: <0.1 kU/L
Sheep Sorrel IgE Qn: <0.1 kU/L
Timothy Grass IgE: <0.1 kU/L
White Mulberry IgE: <0.1 kU/L

## 2023-11-15 ENCOUNTER — Encounter (INDEPENDENT_AMBULATORY_CARE_PROVIDER_SITE_OTHER): Payer: Self-pay | Admitting: Gastroenterology

## 2023-12-14 DIAGNOSIS — M79642 Pain in left hand: Secondary | ICD-10-CM | POA: Diagnosis not present

## 2023-12-14 DIAGNOSIS — M20031 Swan-neck deformity of right finger(s): Secondary | ICD-10-CM | POA: Diagnosis not present

## 2023-12-14 DIAGNOSIS — M20032 Swan-neck deformity of left finger(s): Secondary | ICD-10-CM | POA: Diagnosis not present

## 2023-12-14 DIAGNOSIS — M069 Rheumatoid arthritis, unspecified: Secondary | ICD-10-CM | POA: Diagnosis not present

## 2023-12-14 DIAGNOSIS — M79641 Pain in right hand: Secondary | ICD-10-CM | POA: Diagnosis not present

## 2023-12-14 NOTE — Progress Notes (Signed)
 Orthopaedic Surgery Hand and Upper Extremity History and Physical Examination  CC: Bilateral hand deformities  HPI 12/14/2023: History of Present Illness The patient, an 83 year old male, presents with bilateral hand deformities, with the left hand being more severely affected.  He reports experiencing discomfort in both hands, with the left hand exhibiting greater severity. He has been unable to grasp objects, such as a glass, due to this impairment. Additionally, he notes episodes of digital locking upon attempting to flex his fingers from an extended position. These symptoms have persisted for an extended duration, with the left hand being symptomatic for a longer period than the right. The patient has a known diagnosis of rheumatoid arthritis, for which he is currently undergoing methotrexate  therapy. His diagnosis of rheumatoid arthritis dates back several years, and he does not currently consult with a rheumatologist, instead obtaining his prescriptions from his primary care physician. He reports no arthralgia in other joints.  MEDICATIONS Methotrexate    Problem List:  Problem List[1]  Past Medical History: Medical History[2]   Medications: Current Rx ordered in [facility][3]  Allergies: Allergies as of 12/14/2023  . (No Known Allergies)    Past Surgical History: Surgical History[4]   Social History: Social History   Occupational History  . Not on file  Tobacco Use  . Smoking status: Never  . Smokeless tobacco: Never  Substance and Sexual Activity  . Alcohol use: Not Currently  . Drug use: Not Currently  . Sexual activity: Not on file     Family History: Family History[5] Otherwise, no relevant orthopaedic family history  ROS: Review of Systems: All systems reviewed and are negative except that mentioned in HPI  Work/Sport/Hobbies: See HPI  Physical Examination: Vitals:   12/14/23 0832  BP: 120/79  Pulse:   Temp: 97.8 F (36.6 C)   Constitutional:  Awake, alert.  WN/WD Appearance: healthy, no acute distress, well-groomed Affect: Normal HEENT: EOMI, mucous membranes moist CV: RRR Pulm: breathing comfortably  Left upper Extremity / Hand Physical Exam Musculoskeletal: The left upper extremity is able to make a composite fist. There are swan neck deformities of the index, long, and ring fingers. There is slight PIP flexion contracture of the small finger. The MCP joints and PIP joints of the 2nd through 5th digit are nontender. There are MCP joint contractures. The index finger is 55 degrees and passively MCP range of motion of the index finger is 25 degrees. The long finger MCP joint flexion contracture is 55 degrees, passive is 35 degrees. The ring finger is 50 degrees MCP joint and passive is 35 degrees and the small finger MCP joint is 15 degrees of hyperextension. The index finger can flex to 80 degrees, long finger to 85 degrees, ring finger to 80 degrees, and small finger to 85 degrees.  Results: Results Imaging X-rays of the left hand show swan neck deformities and degenerative changes in the joints of both hands, with arthritis at the IP joint and CMC joint of the thumb.   X-rays of the right hand demonstrate mild arthritis in the PIP joints and arthritis at the thumb IP and CMC joint.    Assessment/Plan:  Assessment & Plan 1. 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 - Likely due to rheumatoid arthritis, evidenced by swan neck deformities and degenerative changes in joints - X-rays show arthritis in IP and CMC joints of the thumb, and similar patterns in the right hand - Hyperextended PIP joints of index, long, and ring fingers  contributing to clicking sensation - Joint inflammation and irritation (synovitis) may be causing displacement of joints - Significant joint contractures may benefit from therapy aimed at stretching joints and using bracing devices to prevent hyperextension -  Referral for physical therapy to address joint contractures and improve hand function - Consider surgical intervention if no improvement with therapy   Follow-up - Patient to follow up in 8 weeks    Marsa HERO. Chiaramonti, MD Hand and Upper Extremity Surgery [facility] Department of Orthopaedic Surgery [facility] 12/14/2023 9:20 AM       [1] There is no problem list on file for this patient. [2] History reviewed. No pertinent past medical history. [3] Meds Ordered in [facility]  Medication Sig Dispense Refill  . amLODIPine  (NORVASC ) 5 mg tablet Take 5 mg by mouth daily.    SABRA aspirin 81 mg chewable tablet Chew 81 mg daily.    . carvediloL  (COREG ) 3.125 mg tablet Take 3.125 mg by mouth. with a meal    . cetirizine  (ZyrTEC ) 10 mg tablet TAKE 1 TABLET BY MOUTH TWICE DAILY AS NEEDED FOR ALLERGIES OR HIVES.    SABRA cholecalciferol (VITAMIN D3) 1,000 unit (25 mcg) tablet Take 1,000 Units by mouth daily.    . Cinnamon 500 mg cap Take by mouth.    . dorzolamide -timoloL  (COSOPT ) 22.3-6.8 mg/mL ophthalmic solution Administer 1 drop into both eyes 2 (two) times a day.    . ferrous gluconate 324 mg (38 mg iron) tab tablet Take 38 mg of iron by mouth daily.    . fluticasone  propionate (FLONASE ) 50 mcg/spray nasal spray Administer 2 sprays into each nostril daily.    . folic acid  (FOLVITE ) 1 mg tablet Take 1 tablet by mouth daily.    . latanoprost  (XALATAN ) 0.005 % ophthalmic solution Administer 1 drop into both eyes.    . lisinopriL  (PRINIVIL ) 40 mg tablet Take 40 mg by mouth daily.    SABRA LYCOPENE ORAL Take 1 tablet by mouth.    . methotrexate  2.5 mg tablet Take 8 tablets by mouth once a week.    . rosuvastatin (CRESTOR) 5 mg tablet Take 1 tablet by mouth daily.     No current [facility]-ordered facility-administered medications on file.  [4] History reviewed. No pertinent surgical history. [5] Family History Problem Relation Name Age of  Onset  . Diabetes Sister

## 2023-12-18 ENCOUNTER — Encounter (HOSPITAL_COMMUNITY): Payer: Self-pay | Admitting: Occupational Therapy

## 2023-12-18 ENCOUNTER — Ambulatory Visit (HOSPITAL_COMMUNITY): Attending: Orthopedic Surgery | Admitting: Occupational Therapy

## 2023-12-18 ENCOUNTER — Other Ambulatory Visit: Payer: Self-pay

## 2023-12-18 DIAGNOSIS — R29898 Other symptoms and signs involving the musculoskeletal system: Secondary | ICD-10-CM | POA: Diagnosis not present

## 2023-12-18 DIAGNOSIS — R278 Other lack of coordination: Secondary | ICD-10-CM | POA: Insufficient documentation

## 2023-12-18 NOTE — Patient Instructions (Signed)
Complete each exercise 10-15X, 2-3X/day  1) Towel crunch Place a small towel on a firm table top. Flatten out the towel and then place your hand on one end of it.  Next, flex your fingers 2-5 (index finger through pinky finger) as you pull the towel towards your hand.    2) Digit composite flexion/adduction (make a fist) Hold your hand up as shown. Open and close your hand into a fist and repeat. If you cannot make a full fist, then make a partial fist.    3) Thumb/finger opposition Touch the tip of the thumb to each fingertip one by one. Extend fingers fully after they are touched.      4) Finger Taps Start with the hand flat and fingers slightly spread.  One at a time, starting with the thumb, lift each finger up separately.    5) PIP Joint Blocking Grasp the affected finger, bracing below the middle knuckle, and actively bend the finger as shown.    6) DIP Joint Blocking Grasp the affected finger, bracing below the last knuckle, and actively bend the finger at the last joint.     7) Digit Abduction/Adduction Hold hand palm down flat on table. Spread your fingers apart and back together.   

## 2023-12-18 NOTE — Therapy (Unsigned)
 OUTPATIENT OCCUPATIONAL THERAPY ORTHO EVALUATION  Patient Name: Matthew Weiss. MRN: [id number] DOB:1941-01-22, 83 y.o., male Today's Date: 12/19/2023  PCP: Shona Norleen PEDLAR, MD REFERRING PROVIDER: Delene Blunt, MD  END OF SESSION:  OT End of Session - 12/19/23 1638     Visit Number 1    Number of Visits 7    Date for Recertification  02/09/24    Authorization Type [employer]    Progress Note Due on Visit 10    OT Start Time 0945    OT Stop Time 1022    OT Time Calculation (min) 37 min    Activity Tolerance Patient tolerated treatment well    Behavior During Therapy WFL for tasks assessed/performed          Past Medical History:  Diagnosis Date   Arthritis    RA   Colon polyps    Depression    Dyslipidemia    Hypertension    LBBB (left bundle branch block)    Which comes and goes.   NICM (nonischemic cardiomyopathy) (HCC)    Echo 09/22/09, EF 45-50%   Rotator cuff tear    Shortness of breath    exertion   Past Surgical History:  Procedure Laterality Date   BACK SURGERY     BIOPSY  11/11/2021   Procedure: BIOPSY;  Surgeon: Eartha Angelia Sieving, MD;  [facility]: [facility];  Service: Gastroenterology;;   CARDIAC CATHETERIZATION  09/22/09   Mild non-obstructive CAD   CARDIOVASCULAR STRESS TEST  06/23/2008   Exercise:  normal perfusion. Low risk.   COLONOSCOPY  10/12/2011   Procedure: COLONOSCOPY;  Surgeon: Claudis RAYMOND Rivet, MD;  [facility]: [facility];  Service: Endoscopy;  Laterality: N/A;  930   COLONOSCOPY N/A 11/10/2016   Procedure: COLONOSCOPY;  Surgeon: Rivet Claudis RAYMOND, MD;  [facility]: [facility];  Service: Endoscopy;  Laterality: N/A;  930   COLONOSCOPY WITH PROPOFOL  N/A 11/11/2021   Procedure: COLONOSCOPY WITH PROPOFOL ;  Surgeon: Eartha Angelia Sieving, MD;  [facility]: [facility];  Service: Gastroenterology;  Laterality: N/A;  1030 ASA 3   EYE SURGERY     CATARACTS ; RIGHT EYE Jun 19, 2018, LEFT EYE JUNE 9TH, 2020 , SAW HIS  EYE DOCTOR AFTER THE JUNE 9TH PROCEDURE   LEFT FOOT SURGERY   2015   POLYPECTOMY  11/10/2016   Procedure: POLYPECTOMY;  Surgeon: Rivet Claudis RAYMOND, MD;  [facility]: [facility];  Service: Endoscopy;;  colon   POLYPECTOMY  11/11/2021   Procedure: POLYPECTOMY;  Surgeon: Eartha Angelia Sieving, MD;  Location: [facility];  Service: Gastroenterology;;   SHOULDER OPEN ROTATOR CUFF REPAIR Left 07/25/2018   Procedure: Left shoulder rotator cuff repair with graft;  Surgeon: Heide Ingle, MD;  Location: [facility];  Service: Orthopedics;  Laterality: Left;    TRANSTHORACIC ECHOCARDIOGRAM  09/22/09   EF 45-50%.  No significant valvular disease.    Patient Active Problem List   Diagnosis Date Noted   Partial nontraumatic tear of rotator cuff 07/25/2018   Hypercholesterolemia 02/02/2018   Hx of colonic polyps 10/05/2016   Essential hypertension 10/03/2012   NICM (nonischemic cardiomyopathy): last echo 09/22/09.  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 He reports experiencing discomfort in both hands, with the left hand exhibiting greater severity. He has been unable to  grasp objects, such as a glass, due to this impairment. Additionally, he notes episodes of digital locking upon attempting to flex his fingers from an extended position. These symptoms have persisted for an extended duration, with the left hand being symptomatic for a longer period than the right. The patient has a known diagnosis of rheumatoid arthritis, for which he is currently undergoing methotrexate  therapy.   PRECAUTIONS: None  WEIGHT BEARING RESTRICTIONS: No  PAIN:  Are you having pain? No  FALLS: Has patient fallen in last 6 months? No  PLOF: Independent  PATIENT GOALS: To move my hands more  NEXT MD VISIT: 02/12/24  OBJECTIVE:  Note: Objective measures were completed at Evaluation unless otherwise noted.  HAND DOMINANCE: Left  ADLs: Overall ADLs: Pt unable to complete fine motor tasks, like small buttons, zippers, and clasps. Pt also is not able to grasp and maintain hold on objects such as a drinking glass. Pt also has mod to max difficulty with dressing due to hold on clothing items.   FUNCTIONAL OUTCOME MEASURES: Quick Dash: 36.36  UPPER EXTREMITY ROM:     Active ROM Right eval Left eval  Thumb MCP (0-60) 65 65  Thumb IP (0-80) 15 15  Thumb Opposition to Small Finger able able   Index MCP (0-90) 65 85   Index PIP (0-100) 95  80  Index DIP (0-70)  60  60  Long MCP (0-90)  80  90  Long PIP (0-100)  100  85  Long DIP (0-70)  70  75  Ring MCP (0-90)  75  90  Ring PIP (0-100)  100  90  Ring DIP (0-70)  65  75  Little MCP (0-90)  95  85  Little PIP (0-100) 95   90  Little DIP (0-70) 60   70  (Blank rows = not tested)  BUE make 90-95% of a full fist with increased effort  HAND FUNCTION: Grip strength: Right: 69 lbs; Left: 79 lbs, Lateral pinch: Right: 19 lbs, Left: 16 lbs, and 3 point pinch: Right: 14 lbs, Left: 13 lbs  COORDINATION: 9 Hole Peg test: Right: 41.37 sec; Left: 1 min 31 sec  SENSATION: WFL  EDEMA: Mild edema noted in the LUE and in all  IP joints  OBSERVATIONS: Hyperextension in PIP joints of LUE D2-D4 and RUE D3   TREATMENT DATE:   12/18/23 -Digit ROM: composite flexion, abduction, finger taps, opposition, x10 -Towel crumple: x2 -Edema massage from fingers up to forearm to reduce swelling and improve mobility.  PATIENT EDUCATION: Education details: Digit ROM and Edema Relief Techniques Person educated: Patient Education method: Explanation, Demonstration, and Handouts Education comprehension: verbalized understanding and returned demonstration  HOME EXERCISE PROGRAM: 11/17: Digit ROM and Edema Relief  GOALS: Goals reviewed with patient? Yes  SHORT TERM GOALS: Target date: 02/09/23  Pt will be provided with and educated on HEP to improve strength and mobility required for functional use of BUE during ADLs.   Goal status: INITIAL  2.  Pt will decrease pain in bilateral hands to 2/10 or less to improve ability to perform ADL tasks with less than 2 rest breaks per task.   Goal status: INITIAL  3.  Pt will be provided with education on AE and DME available to improve safety and independence in ADL completion.   Goal status: INITIAL  4.  Pt will increase bilateral grip strength by 10# and pinch strength by 2# to improve ability to grasp and hold items when performing simple meal prep or household tasks.   Goal status: INITIAL   ASSESSMENT:  CLINICAL IMPRESSION: Patient is a 83 y.o. male who was seen today for occupational therapy evaluation for b/l hand deformities and discomfort. Pt presents with decreased abilities to complete fine motor tasks, maintain grasp on items such as a drink or remote, as well as pain/discomfort in his joints.   PERFORMANCE DEFICITS: in functional skills including ADLs, IADLs, coordination, dexterity, sensation, edema, ROM, fascial restrictions, Fine motor  control, body mechanics, and UE functional use.  IMPAIRMENTS: are limiting patient from ADLs, IADLs, rest and sleep, work, leisure, and social participation.   COMORBIDITIES: may have co-morbidities  that affects occupational performance. Patient will benefit from skilled OT to address above impairments and improve overall function.  MODIFICATION OR ASSISTANCE TO COMPLETE EVALUATION: Min-Moderate modification of tasks or assist with assess necessary to complete an evaluation.  OT OCCUPATIONAL PROFILE AND HISTORY: Detailed assessment: Review of records and additional review of physical, cognitive, psychosocial history related to current functional performance.  CLINICAL DECISION MAKING: Moderate - several treatment options, min-mod task modification necessary  REHAB POTENTIAL: Good  EVALUATION COMPLEXITY: Moderate      PLAN:  OT FREQUENCY: 1x/week  OT DURATION: 6 weeks  PLANNED INTERVENTIONS: 97168 OT Re-evaluation, 97535 self care/ADL training, 02889 therapeutic exercise, 97530 therapeutic activity, 97112 neuromuscular re-education, 97140 manual therapy, 97035 ultrasound, 97010 moist heat, 97032 electrical stimulation (manual), passive range of motion, functional mobility training, energy conservation, coping strategies training, patient/family education, and DME and/or AE instructions  RECOMMENDED OTHER SERVICES: N/A  CONSULTED AND AGREED WITH PLAN OF CARE: Patient  PLAN FOR NEXT SESSION: Trial manual, Trial Paraffin, grip and pinch exercises, stretching, possible splints for fingers  Valentin Nightingale, OTR/L [facility] [phone] Ramar Nobrega Jillyn Nightingale, OT 12/19/2023, 4:41 PM

## 2023-12-25 ENCOUNTER — Ambulatory Visit: Admitting: Internal Medicine

## 2023-12-25 ENCOUNTER — Encounter: Payer: Self-pay | Admitting: Internal Medicine

## 2023-12-25 VITALS — BP 126/66 | HR 66 | Temp 98.3°F | Resp 16 | Wt 151.1 lb

## 2023-12-25 DIAGNOSIS — J3089 Other allergic rhinitis: Secondary | ICD-10-CM

## 2023-12-25 DIAGNOSIS — L508 Other urticaria: Secondary | ICD-10-CM | POA: Diagnosis not present

## 2023-12-25 MED ORDER — AZELASTINE HCL 0.1 % NA SOLN: 2.0000 | Freq: Two times a day (BID) | NASAL | 5 refills | Status: AC | PRN

## 2023-12-25 MED ORDER — FLUTICASONE PROPIONATE 50 MCG/ACT NA SUSP: 2.0000 | Freq: Every day | NASAL | 5 refills | Status: AC

## 2023-12-25 MED ORDER — CETIRIZINE HCL 10 MG PO TABS: 10.0000 mg | ORAL_TABLET | Freq: Two times a day (BID) | ORAL | 5 refills | Status: AC | PRN

## 2023-12-25 NOTE — Patient Instructions (Addendum)
 Allergic Rhinitis: - Positive skin test 10/2023: none - sIgE 10/2023: positive to cats and dogs  - Use nasal saline rinses before nose sprays such as with Neilmed Sinus Rinse.  Use distilled water .   - Use Flonase  2 sprays each nostril daily. Aim upward and outward. - Use Azelastine  2 sprays each nostril twice daily as needed for congestion, drainage, runny nose. Aim upward and outward.  - Use Zyrtec  10 mg daily.     Urticaria (Hives)  - At this time etiology of hives and swelling is unknown. Hives can be caused by a variety of different triggers including illness/infection, pressure, vibrations, extremes of temperature to name a few however majority of the time there is no identifiable trigger.  -Continue Zyrtec  (Cetirizine ) 10mg  daily.   -If no improvement in 3 days, increase to Zyrtec  10mg  twice daily.

## 2023-12-25 NOTE — Progress Notes (Signed)
   FOLLOW UP Date of Service/Encounter:  12/25/23   Subjective:  Matthew Weiss. (DOB: 10-05-1940) is a 83 y.o. male who [facility] for follow up for allergic rhinitis and urticaria.   History obtained from: chart review and patient. Last visit was with me on 10/23/2023 for skin testing but blood testing was obtained due to dermatographism.  Discussed use of Zyrtec  10mg  BID for hives and Flonase  for rhinitis. sIgE positive to cats/dogs.   Notes no trouble with hives for almost a month.  Denies trouble with itching either.  Taking Zyrtec  daily. Does note some recent congestion and drainage.  Took theraflu OTC and it resolved.  Also taking Flonase  and Zyrtec . Denies itchy watery eyes.   Past Medical History: Past Medical History:  Diagnosis Date   Arthritis    RA   Colon polyps    Depression    Dyslipidemia    Hypertension    LBBB (left bundle branch block)    Which comes and goes.   NICM (nonischemic cardiomyopathy) (HCC)    Echo 09/22/09, EF 45-50%   Rotator cuff tear    Shortness of breath    exertion    Objective:  BP (!) 146/68   Pulse 66   Temp 98.3 F (36.8 C)   Resp 16   Wt 151 lb 2 oz (68.5 kg)   SpO2 99%   BMI 24.58 kg/m  Body mass index is 24.58 kg/m. Physical Exam: GEN: alert, well developed HEENT: clear conjunctiva, nose with mild inferior turbinate hypertrophy, pink nasal mucosa, + clear rhinorrhea, + cobblestoning HEART: regular rate and rhythm, no murmur LUNGS: clear to auscultation bilaterally, no coughing, unlabored respiration SKIN: no rashes or lesions  Assessment:   1. Perennial allergic rhinitis   2. Chronic urticaria     Plan/Recommendations:   Allergic Rhinitis: - Uncontrolled, adding Azelastine .  - Positive skin test 10/2023: none - sIgE 10/2023: positive to cats and dogs  - Use nasal saline rinses before nose sprays such as with Neilmed Sinus Rinse.  Use distilled water .   - Use Flonase  2  sprays each nostril daily. Aim upward and outward. - Use Azelastine  2 sprays each nostril twice daily as needed for congestion, drainage, runny nose. Aim upward and outward.  - Use Zyrtec  10 mg daily.     Urticaria (Hives)  - Controlled  - At this time etiology of hives and swelling is unknown. Hives can be caused by a variety of different triggers including illness/infection, pressure, vibrations, extremes of temperature to name a few however majority of the time there is no identifiable trigger.  -Continue Zyrtec  (Cetirizine ) 10mg  daily.   -If no improvement in 3 days, increase to Zyrtec  10mg  twice daily.      Return in about 6 months (around 06/23/2024).  Arleta Blanch, MD [facility]

## 2023-12-27 ENCOUNTER — Encounter (HOSPITAL_COMMUNITY): Payer: Self-pay | Admitting: Occupational Therapy

## 2023-12-27 ENCOUNTER — Ambulatory Visit (HOSPITAL_COMMUNITY): Admitting: Occupational Therapy

## 2023-12-27 DIAGNOSIS — R278 Other lack of coordination: Secondary | ICD-10-CM | POA: Diagnosis not present

## 2023-12-27 DIAGNOSIS — R29898 Other symptoms and signs involving the musculoskeletal system: Secondary | ICD-10-CM

## 2023-12-27 NOTE — Therapy (Signed)
 OUTPATIENT OCCUPATIONAL THERAPY ORTHO EVALUATION  Patient Name: Matthew Weiss. MRN: [id number] DOB:1941/01/10, 83 y.o., male Today's Date: 12/27/2023  PCP: Shona Norleen PEDLAR, MD REFERRING PROVIDER: Delene Blunt, MD  END OF SESSION:  OT End of Session - 12/27/23 1017     Visit Number 2    Number of Visits 7    Date for Recertification  02/09/24    Authorization Type [employer]    Progress Note Due on Visit 10    OT Start Time 0903    OT Stop Time 0948    OT Time Calculation (min) 45 min    Activity Tolerance Patient tolerated treatment well    Behavior During Therapy [facility] for tasks assessed/performed           Past Medical History:  Diagnosis Date   Arthritis    RA   Colon polyps    Depression    Dyslipidemia    Hypertension    LBBB (left bundle branch block)    Which comes and goes.   NICM (nonischemic cardiomyopathy) (HCC)    Echo 09/22/09, EF 45-50%   Rotator cuff tear    Shortness of breath    exertion   Past Surgical History:  Procedure Laterality Date   BACK SURGERY     BIOPSY  11/11/2021   Procedure: BIOPSY;  Surgeon: Eartha Angelia Sieving, MD;  [facility];  Service: Gastroenterology;;   CARDIAC CATHETERIZATION  09/22/09   Mild non-obstructive CAD   CARDIOVASCULAR STRESS TEST  06/23/2008   Exercise:  normal perfusion. Low risk.   COLONOSCOPY  10/12/2011   Procedure: COLONOSCOPY;  Surgeon: Claudis RAYMOND Rivet, MD;  [facility];  Service: Endoscopy;  Laterality: N/A;  930   COLONOSCOPY N/A 11/10/2016   Procedure: COLONOSCOPY;  Surgeon: Rivet Claudis RAYMOND, MD;  [facility];  Service: Endoscopy;  Laterality: N/A;  930   COLONOSCOPY WITH PROPOFOL  N/A 11/11/2021   Procedure: COLONOSCOPY WITH PROPOFOL ;  Surgeon: Eartha Angelia Sieving, MD;  [facility];  Service: Gastroenterology;  Laterality: N/A;  1030 ASA 3   EYE SURGERY     CATARACTS ; RIGHT EYE Jun 19, 2018, LEFT EYE JUNE 9TH, 2020 , SAW HIS  EYE DOCTOR AFTER THE JUNE 9TH PROCEDURE   LEFT FOOT SURGERY   2015   POLYPECTOMY  11/10/2016   Procedure: POLYPECTOMY;  Surgeon: Rivet Claudis RAYMOND, MD;  [facility];  Service: Endoscopy;;  colon   POLYPECTOMY  11/11/2021   Procedure: POLYPECTOMY;  Surgeon: Eartha Angelia Sieving, MD;  [facility];  Service: Gastroenterology;;   SHOULDER OPEN ROTATOR CUFF REPAIR Left 07/25/2018   Procedure: Left shoulder rotator cuff repair with graft;  Surgeon: Heide Ingle, MD;  Location: [facility];  Service: Orthopedics;  Laterality: Left;    TRANSTHORACIC ECHOCARDIOGRAM  09/22/09   EF 45-50%.  No significant valvular disease.    Patient Active Problem List   Diagnosis Date Noted   Partial nontraumatic tear of rotator cuff 07/25/2018   Hypercholesterolemia 02/02/2018   Hx of colonic polyps 10/05/2016   Essential hypertension 10/03/2012   NICM (nonischemic cardiomyopathy): last echo 09/22/09.  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 He reports experiencing discomfort in both hands, with the left hand exhibiting greater severity. He has been unable to  grasp objects, such as a glass, due to this impairment. Additionally, he notes episodes of digital locking upon attempting to flex his fingers from an extended position. These symptoms have persisted for an extended duration, with the left hand being symptomatic for a longer period than the right. The patient has a known diagnosis of rheumatoid arthritis, for which he is currently undergoing methotrexate  therapy.   PRECAUTIONS: None  WEIGHT BEARING RESTRICTIONS: No  PAIN:  Are you having pain? No  FALLS: Has patient fallen in last 6 months? No  PLOF: Independent  PATIENT GOALS: To move my hands more  NEXT MD VISIT: 02/12/24  OBJECTIVE:  Note: Objective measures were completed at Evaluation unless otherwise noted.  HAND DOMINANCE: Left  ADLs: Overall ADLs: Pt unable to complete fine motor tasks, like small buttons, zippers, and clasps. Pt also is not able to grasp and maintain hold on objects such as a drinking glass. Pt also has mod to max difficulty with dressing due to hold on clothing items.   FUNCTIONAL OUTCOME MEASURES: Quick Dash: 36.36  UPPER EXTREMITY ROM:     Active ROM Right eval Left eval  Thumb MCP (0-60) 65 65  Thumb IP (0-80) 15 15  Thumb Opposition to Small Finger able able   Index MCP (0-90) 65 85   Index PIP (0-100) 95  80  Index DIP (0-70)  60  60  Long MCP (0-90)  80  90  Long PIP (0-100)  100  85  Long DIP (0-70)  70  75  Ring MCP (0-90)  75  90  Ring PIP (0-100)  100  90  Ring DIP (0-70)  65  75  Little MCP (0-90)  95  85  Little PIP (0-100) 95   90  Little DIP (0-70) 60   70  (Blank rows = not tested)  BUE make 90-95% of a full fist with increased effort  HAND FUNCTION: Grip strength: Right: 69 lbs; Left: 79 lbs, Lateral pinch: Right: 19 lbs, Left: 16 lbs, and 3 point pinch: Right: 14 lbs, Left: 13 lbs  COORDINATION: 9 Hole Peg test: Right: 41.37 sec; Left: 1 min 31 sec  SENSATION: WFL  EDEMA: Mild edema noted in the LUE and in all  IP joints  OBSERVATIONS: Hyperextension in PIP joints of LUE D2-D4 and RUE D3   TREATMENT DATE:   12/27/23 -fitting flexion splints on each digit: L - D2: 10, D3: 10, D4: 9; R - D2: 9, D3: 10 -Digit ROM: composite flexion, abduction, finger taps, opposition, x10 -Stretching into extension with manual therapy to reduce fascial restrictions in the palmar aspect of hand and improve ROM -Paraffin bath with moist heat 10', BUE  12/18/23 -Digit ROM: composite flexion, abduction, finger taps, opposition, x10 -Towel crumple: x2 -Edema massage from fingers up to forearm to reduce swelling and improve mobility.  PATIENT EDUCATION: Education details: fitting digit flexion splints Person educated: Patient Education method: Explanation, Demonstration, and Handouts Education comprehension: verbalized understanding and returned demonstration  HOME EXERCISE PROGRAM: 11/17: Digit ROM and Edema Relief  GOALS: Goals reviewed with patient? Yes  SHORT TERM GOALS: Target date: 02/09/23  Pt will be provided with and educated on HEP to improve strength and mobility required for functional use of BUE during ADLs.   Goal status: INITIAL  2.  Pt will decrease pain in bilateral hands to 2/10 or less to improve ability to perform ADL tasks with less than 2 rest breaks per task.   Goal status: INITIAL  3.  Pt will be provided with education on AE and DME available to improve safety and independence in ADL completion.   Goal status: INITIAL  4.  Pt will increase bilateral grip strength by 10# and pinch strength by 2# to improve ability to grasp and hold items when performing simple meal prep or household tasks.   Goal status: INITIAL   ASSESSMENT:  CLINICAL IMPRESSION: This session pt demonstrating continued ROM deficits and pulling sensation/pain with both flexion and  extension. OT fitted pt for digit flexion splints to prevent hyperextension of the PIP joints and decrease joint sticking limiting his grip and mobility. Additionally this session OT provided paraffin bath and moist heat to improve pain and stiffness, allowing for improved mobility. OT providing hands on assist throughout session, as well as verbal and tactile cuing for positioning and technique.   PERFORMANCE DEFICITS: in functional skills including ADLs, IADLs, coordination, dexterity, sensation, edema, ROM, fascial restrictions, Fine motor control, body mechanics, and UE functional use.   PLAN:  OT FREQUENCY: 1x/week  OT DURATION: 6 weeks  PLANNED INTERVENTIONS: 97168 OT Re-evaluation, 97535 self care/ADL training, 02889 therapeutic exercise, 97530 therapeutic activity, 97112 neuromuscular re-education, 97140 manual therapy, 97035 ultrasound, 97010 moist heat, 97032 electrical stimulation (manual), passive range of motion, functional mobility training, energy conservation, coping strategies training, patient/family education, and DME and/or AE instructions  RECOMMENDED OTHER SERVICES: N/A  CONSULTED AND AGREED WITH PLAN OF CARE: Patient  PLAN FOR NEXT SESSION: Trial manual, Trial Paraffin, grip and pinch exercises, stretching, possible splints for fingers  Valentin Nightingale, OTR/L [facility] [phone] Bisma Klett Jillyn Nightingale, OT 12/27/2023, 10:18 AM

## 2024-01-03 ENCOUNTER — Encounter (HOSPITAL_COMMUNITY): Payer: Self-pay | Admitting: Occupational Therapy

## 2024-01-03 ENCOUNTER — Ambulatory Visit (HOSPITAL_COMMUNITY): Admitting: Occupational Therapy

## 2024-01-03 DIAGNOSIS — R278 Other lack of coordination: Secondary | ICD-10-CM | POA: Insufficient documentation

## 2024-01-03 DIAGNOSIS — R29898 Other symptoms and signs involving the musculoskeletal system: Secondary | ICD-10-CM | POA: Insufficient documentation

## 2024-01-03 NOTE — Therapy (Signed)
 OUTPATIENT OCCUPATIONAL THERAPY ORTHO TREATMENT  Patient Name: Matthew Weiss. MRN: [id number] DOB:16-Jun-1940, 83 y.o., male Today's Date: 01/03/2024  PCP: Shona Norleen PEDLAR, MD REFERRING PROVIDER: Delene Blunt, MD  END OF SESSION:  OT End of Session - 01/03/24 1040     Visit Number 3    Number of Visits 7    Date for Recertification  02/09/24    Authorization Type [employer]    Progress Note Due on Visit 10    OT Start Time 0945    OT Stop Time 1030    OT Time Calculation (min) 45 min    Activity Tolerance Patient tolerated treatment well    Behavior During Therapy WFL for tasks assessed/performed            Past Medical History:  Diagnosis Date   Arthritis    RA   Colon polyps    Depression    Dyslipidemia    Hypertension    LBBB (left bundle branch block)    Which comes and goes.   NICM (nonischemic cardiomyopathy) (HCC)    Echo 09/22/09, EF 45-50%   Rotator cuff tear    Shortness of breath    exertion   Past Surgical History:  Procedure Laterality Date   BACK SURGERY     BIOPSY  11/11/2021   Procedure: BIOPSY;  Surgeon: Eartha Angelia Sieving, MD;  [facility];  Service: Gastroenterology;;   CARDIAC CATHETERIZATION  09/22/09   Mild non-obstructive CAD   CARDIOVASCULAR STRESS TEST  06/23/2008   Exercise:  normal perfusion. Low risk.   COLONOSCOPY  10/12/2011   Procedure: COLONOSCOPY;  Surgeon: Claudis RAYMOND Rivet, MD;  [facility];  Service: Endoscopy;  Laterality: N/A;  930   COLONOSCOPY N/A 11/10/2016   Procedure: COLONOSCOPY;  Surgeon: Rivet Claudis RAYMOND, MD;  [facility];  Service: Endoscopy;  Laterality: N/A;  930   COLONOSCOPY WITH PROPOFOL  N/A 11/11/2021   Procedure: COLONOSCOPY WITH PROPOFOL ;  Surgeon: Eartha Angelia Sieving, MD;  [facility];  Service: Gastroenterology;  Laterality: N/A;  1030 ASA 3   EYE SURGERY     CATARACTS ; RIGHT EYE Jun 19, 2018, LEFT EYE JUNE 9TH, 2020 , SAW HIS  EYE DOCTOR AFTER THE JUNE 9TH PROCEDURE   LEFT FOOT SURGERY   2015   POLYPECTOMY  11/10/2016   Procedure: POLYPECTOMY;  Surgeon: Rivet Claudis RAYMOND, MD;  [facility];  Service: Endoscopy;;  colon   POLYPECTOMY  11/11/2021   Procedure: POLYPECTOMY;  Surgeon: Eartha Angelia Sieving, MD;  [facility];  Service: Gastroenterology;;   SHOULDER OPEN ROTATOR CUFF REPAIR Left 07/25/2018   Procedure: Left shoulder rotator cuff repair with graft;  Surgeon: Heide Ingle, MD;  Location: [facility];  Service: Orthopedics;  Laterality: Left;    TRANSTHORACIC ECHOCARDIOGRAM  09/22/09   EF 45-50%.  No significant valvular disease.    Patient Active Problem List   Diagnosis Date Noted   Partial nontraumatic tear of rotator cuff 07/25/2018   Hypercholesterolemia 02/02/2018   Hx of colonic polyps 10/05/2016   Essential hypertension 10/03/2012   NICM (nonischemic cardiomyopathy): last echo 09/22/09.  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 He reports experiencing discomfort in both hands, with the left hand exhibiting greater severity. He has been unable to  grasp objects, such as a glass, due to this impairment. Additionally, he notes episodes of digital locking upon attempting to flex his fingers from an extended position. These symptoms have persisted for an extended duration, with the left hand being symptomatic for a longer period than the right. The patient has a known diagnosis of rheumatoid arthritis, for which he is currently undergoing methotrexate  therapy.   PRECAUTIONS: None  WEIGHT BEARING RESTRICTIONS: No  PAIN:  Are you having pain? No  FALLS: Has patient fallen in last 6 months? No  PLOF: Independent  PATIENT GOALS: To move my hands more  NEXT MD VISIT: 02/12/24  OBJECTIVE:  Note: Objective measures were completed at Evaluation unless otherwise noted.  HAND DOMINANCE: Left  ADLs: Overall ADLs: Pt unable to complete fine motor tasks, like small buttons, zippers, and clasps. Pt also is not able to grasp and maintain hold on objects such as a drinking glass. Pt also has mod to max difficulty with dressing due to hold on clothing items.   FUNCTIONAL OUTCOME MEASURES: Quick Dash: 36.36  UPPER EXTREMITY ROM:     Active ROM Right eval Left eval  Thumb MCP (0-60) 65 65  Thumb IP (0-80) 15 15  Thumb Opposition to Small Finger able able   Index MCP (0-90) 65 85   Index PIP (0-100) 95  80  Index DIP (0-70)  60  60  Long MCP (0-90)  80  90  Long PIP (0-100)  100  85  Long DIP (0-70)  70  75  Ring MCP (0-90)  75  90  Ring PIP (0-100)  100  90  Ring DIP (0-70)  65  75  Little MCP (0-90)  95  85  Little PIP (0-100) 95   90  Little DIP (0-70) 60   70  (Blank rows = not tested)  BUE make 90-95% of a full fist with increased effort  HAND FUNCTION: Grip strength: Right: 69 lbs; Left: 79 lbs, Lateral pinch: Right: 19 lbs, Left: 16 lbs, and 3 point pinch: Right: 14 lbs, Left: 13 lbs  COORDINATION: 9 Hole Peg test: Right: 41.37 sec; Left: 1 min 31 sec  SENSATION: WFL  EDEMA: Mild edema noted in the LUE and in all  IP joints  OBSERVATIONS: Hyperextension in PIP joints of LUE D2-D4 and RUE D3   TREATMENT DATE:   01/03/24 -Edema massage from fingers up to forearm to reduce swelling and improve mobility. -Digit ROM: composite flexion, abduction, finger taps, opposition, x10 -Pinch strength: squeezing clips onto tree with BL hands  -Towel crumple: x2 -Paraffin bath with moist heat 10', BUE    12/27/23 -fitting flexion splints on each digit: L - D2: 10, D3: 10, D4: 9; R - D2: 9, D3: 10 -Digit ROM: composite flexion, abduction, finger taps, opposition, x10 -Stretching into extension with manual therapy to reduce fascial restrictions in the palmar aspect of hand and  improve ROM -Paraffin bath with moist heat 10', BUE  12/18/23 -Digit ROM: composite flexion, abduction, finger taps, opposition, x10 -Towel crumple: x2 -Edema massage from fingers up to forearm to reduce swelling and improve mobility.                                                                                                                               PATIENT EDUCATION: Education details: fitting digit flexion splints Person educated: Patient Education method: Explanation, Demonstration, and Handouts Education comprehension: verbalized understanding and returned demonstration  HOME EXERCISE PROGRAM: 11/17: Digit ROM and Edema Relief  GOALS: Goals reviewed with patient? Yes  SHORT TERM GOALS: Target date: 02/09/23  Pt will be provided with and educated on HEP to improve strength and mobility required for functional use of BUE during ADLs.   Goal status: INITIAL  2.  Pt will decrease pain in bilateral hands to 2/10 or less to improve ability to perform ADL tasks with less than 2 rest breaks per task.   Goal status: INITIAL  3.  Pt will be provided with education on AE and DME available to improve safety and independence in ADL completion.   Goal status: INITIAL  4.  Pt will increase bilateral grip strength by 10#  and pinch strength by 2# to improve ability to grasp and hold items when performing simple meal prep or household tasks.   Goal status: INITIAL   ASSESSMENT:  CLINICAL IMPRESSION: Pt asking about his flexion splints, discussed that they should arrive in a week or so. Continued working on stretching this session. Added in pinch strength exercises with clips. Pt reports no pain with new exercises. Educated pt on button hook and demonstrated with hook in clinic. Pt reports he is interested in using one at home and wants to order one.   PERFORMANCE DEFICITS: in functional skills including ADLs, IADLs, coordination, dexterity, sensation, edema, ROM, fascial restrictions, Fine motor control, body mechanics, and UE functional use.   PLAN:  OT FREQUENCY: 1x/week  OT DURATION: 6 weeks  PLANNED INTERVENTIONS: 97168 OT Re-evaluation, 97535 self care/ADL training, 02889 therapeutic exercise, 97530 therapeutic activity, 97112 neuromuscular re-education, 97140 manual therapy, 97035 ultrasound, 97010 moist heat, 97032 electrical stimulation (manual), passive range of motion, functional mobility training, energy conservation, coping strategies training, patient/family education, and DME and/or AE instructions  RECOMMENDED OTHER SERVICES: N/A  CONSULTED AND AGREED WITH PLAN OF CARE: Patient  PLAN FOR NEXT SESSION: Trial manual, Trial Paraffin, grip and pinch exercises, stretching, possible splints for fingers   [facility] [phone] Chiquita LOISE Sermon, OTR/L 01/03/2024, 10:41 AM

## 2024-01-10 ENCOUNTER — Encounter (HOSPITAL_COMMUNITY): Payer: Self-pay | Admitting: Occupational Therapy

## 2024-01-10 ENCOUNTER — Ambulatory Visit (HOSPITAL_COMMUNITY): Admitting: Occupational Therapy

## 2024-01-10 DIAGNOSIS — R278 Other lack of coordination: Secondary | ICD-10-CM

## 2024-01-10 DIAGNOSIS — R29898 Other symptoms and signs involving the musculoskeletal system: Secondary | ICD-10-CM

## 2024-01-10 NOTE — Therapy (Signed)
 OUTPATIENT OCCUPATIONAL THERAPY ORTHO TREATMENT  Patient Name: Matthew Weiss. MRN: [id number] DOB:Mar 10, 1940, 83 y.o., male Today's Date: 01/10/2024  PCP: Shona Norleen PEDLAR, MD REFERRING PROVIDER: Delene Blunt, MD  END OF SESSION:  OT End of Session - 01/10/24 0939     Visit Number 4    Number of Visits 7    Date for Recertification  02/09/24    Authorization Type [employer]    Progress Note Due on Visit 10    OT Start Time 0900    OT Stop Time 0945    OT Time Calculation (min) 45 min    Activity Tolerance Patient tolerated treatment well    Behavior During Therapy WFL for tasks assessed/performed             Past Medical History:  Diagnosis Date   Arthritis    RA   Colon polyps    Depression    Dyslipidemia    Hypertension    LBBB (left bundle branch block)    Which comes and goes.   NICM (nonischemic cardiomyopathy) (HCC)    Echo 09/22/09, EF 45-50%   Rotator cuff tear    Shortness of breath    exertion   Past Surgical History:  Procedure Laterality Date   BACK SURGERY     BIOPSY  11/11/2021   Procedure: BIOPSY;  Surgeon: Eartha Angelia Sieving, MD;  Location: [facility];  Service: Gastroenterology;;   CARDIAC CATHETERIZATION  09/22/09   Mild non-obstructive CAD   CARDIOVASCULAR STRESS TEST  06/23/2008   Exercise:  normal perfusion. Low risk.   COLONOSCOPY  10/12/2011   Procedure: COLONOSCOPY;  Surgeon: Claudis RAYMOND Rivet, MD;  [facility]: [facility];  Service: Endoscopy;  Laterality: N/A;  930   COLONOSCOPY N/A 11/10/2016   Procedure: COLONOSCOPY;  Surgeon: Rivet Claudis RAYMOND, MD;  [facility]: [facility];  Service: Endoscopy;  Laterality: N/A;  930   COLONOSCOPY WITH PROPOFOL  N/A 11/11/2021   Procedure: COLONOSCOPY WITH PROPOFOL ;  Surgeon: Eartha Angelia Sieving, MD;  [facility]: [facility];  Service: Gastroenterology;  Laterality: N/A;  1030 ASA 3   EYE SURGERY     CATARACTS ; RIGHT EYE Jun 19, 2018, LEFT EYE JUNE 9TH, 2020 , SAW  [facility] DOCTOR AFTER THE JUNE 9TH PROCEDURE   LEFT FOOT SURGERY   2015   POLYPECTOMY  11/10/2016   Procedure: POLYPECTOMY;  Surgeon: Rivet Claudis RAYMOND, MD;  [facility]: [facility];  Service: Endoscopy;;  colon   POLYPECTOMY  11/11/2021   Procedure: POLYPECTOMY;  Surgeon: Eartha Angelia Sieving, MD;  [facility]: [facility];  Service: Gastroenterology;;   SHOULDER OPEN ROTATOR CUFF REPAIR Left 07/25/2018   Procedure: Left shoulder rotator cuff repair with graft;  Surgeon: Heide Ingle, MD;  Location: [facility];  Service: Orthopedics;  Laterality: Left;    TRANSTHORACIC ECHOCARDIOGRAM  09/22/09   EF 45-50%.  No significant valvular disease.    Patient Active Problem List   Diagnosis Date Noted   Partial nontraumatic tear of rotator cuff 07/25/2018   Hypercholesterolemia 02/02/2018   Hx of colonic polyps 10/05/2016   Essential hypertension 10/03/2012   NICM (nonischemic cardiomyopathy): last echo 09/22/09.  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 He reports experiencing discomfort in both hands, with the left hand exhibiting greater severity. He has been unable to grasp objects,  such as a glass, due to this impairment. Additionally, he notes episodes of digital locking upon attempting to flex his fingers from an extended position. These symptoms have persisted for an extended duration, with the left hand being symptomatic for a longer period than the right. The patient has a known diagnosis of rheumatoid arthritis, for which he is currently undergoing methotrexate  therapy.   PRECAUTIONS: None  WEIGHT BEARING RESTRICTIONS: No  PAIN:  Are you having pain? No  FALLS: Has patient fallen in last 6 months? No  PLOF: Independent  PATIENT GOALS: To move my hands more  NEXT MD VISIT: 02/12/24  OBJECTIVE:  Note: Objective measures were completed at Evaluation unless otherwise noted.  HAND DOMINANCE: Left  ADLs: Overall ADLs: Pt unable to complete fine motor tasks, like small buttons, zippers, and clasps. Pt also is not able to grasp and maintain hold on objects such as a drinking glass. Pt also has mod to max difficulty with dressing due to hold on clothing items.   FUNCTIONAL OUTCOME MEASURES: Quick Dash: 36.36  UPPER EXTREMITY ROM:     Active ROM Right eval Left eval  Thumb MCP (0-60) 65 65  Thumb IP (0-80) 15 15  Thumb Opposition to Small Finger able able   Index MCP (0-90) 65 85   Index PIP (0-100) 95  80  Index DIP (0-70)  60  60  Long MCP (0-90)  80  90  Long PIP (0-100)  100  85  Long DIP (0-70)  70  75  Ring MCP (0-90)  75  90  Ring PIP (0-100)  100  90  Ring DIP (0-70)  65  75  Little MCP (0-90)  95  85  Little PIP (0-100) 95   90  Little DIP (0-70) 60   70  (Blank rows = not tested)  BUE make 90-95% of a full fist with increased effort  HAND FUNCTION: Grip strength: Right: 69 lbs; Left: 79 lbs, Lateral pinch: Right: 19 lbs, Left: 16 lbs, and 3 point pinch: Right: 14 lbs, Left: 13 lbs  COORDINATION: 9 Hole Peg test: Right: 41.37 sec; Left: 1 min 31 sec  SENSATION: WFL  EDEMA: Mild edema noted in the LUE and in all IP  joints  OBSERVATIONS: Hyperextension in PIP joints of LUE D2-D4 and RUE D3   TREATMENT DATE:   01/10/24 -Digit ROM: composite flexion, abduction, finger taps, opposition, x10 -Stretching into extension with manual therapy to reduce fascial restrictions in the palmar aspect of hand and improve ROM -Fine motor: removing 6 bolts with L hand and putting back on with R hand  -Wrist/grip strength: wrist flexion/extension curls, supination/pronation, radial/ulnar deviation 10x each with 1lb weight  -Towel crumple: x10 each hand  -Paraffin bath with moist heat 10', BUE  01/03/24 -Edema massage from fingers up to forearm to reduce swelling and improve mobility. -Digit ROM: composite flexion, abduction, finger taps, opposition,  x10 -Pinch strength: squeezing clips onto tree with BL hands  -Towel crumple: x2 -Paraffin bath with moist heat 10', BUE    12/27/23 -fitting flexion splints on each digit: L - D2: 10, D3: 10, D4: 9; R - D2: 9, D3: 10 -Digit ROM: composite flexion, abduction, finger taps, opposition, x10 -Stretching into extension with manual therapy to reduce fascial restrictions in the palmar aspect of hand and improve ROM -Paraffin bath with moist heat 10', BUE                                                                                                                              PATIENT EDUCATION: Education details: fitting digit flexion splints Person educated: Patient Education method: Programmer, Multimedia, Demonstration, and Handouts Education comprehension: verbalized understanding and returned demonstration  HOME EXERCISE PROGRAM: 11/17: Digit ROM and Edema Relief  GOALS: Goals reviewed with patient? Yes  SHORT TERM GOALS: Target date: 02/09/23  Pt will be provided with and educated on HEP to improve strength and mobility required for functional use of BUE during ADLs.   Goal status: INITIAL  2.  Pt will decrease pain in bilateral hands to 2/10 or less to improve  ability to perform ADL tasks with less than 2 rest breaks per task.   Goal status: INITIAL  3.  Pt will be provided with education on AE and DME available to improve safety and independence in ADL completion.   Goal status: INITIAL  4.  Pt will increase bilateral grip strength by 10# and pinch strength by 2# to improve ability to grasp and hold items when performing simple meal prep or household tasks.   Goal status: INITIAL   ASSESSMENT:  CLINICAL IMPRESSION: Pt demonstrating increased difficulty with finger taps on BL hands today- possibly due to the cold weather. He was unable to flex R middle phalanx during towel scrunches today and required assist.Continued stretching and added in fine motor exercises. Ended session with paraffin and heat to improve ROM. Discussed new home exercises with patient during heat with paraffin to BL hands. Discussed importance of maintaining grip strength.   PERFORMANCE DEFICITS: in functional skills including ADLs, IADLs, coordination, dexterity, sensation, edema, ROM, fascial restrictions, Fine motor control, body mechanics, and UE functional use.   PLAN:  OT FREQUENCY: 1x/week  OT DURATION: 6 weeks  PLANNED INTERVENTIONS: 97168 OT Re-evaluation, 97535 self care/ADL training, 02889 therapeutic exercise, 97530 therapeutic activity, 97112 neuromuscular re-education, 97140 manual therapy, 97035 ultrasound, 97010 moist heat, 97032 electrical stimulation (manual), passive range of motion, functional mobility training, energy conservation, coping strategies training, patient/family education, and DME and/or AE instructions  RECOMMENDED OTHER SERVICES: N/A  CONSULTED AND AGREED WITH PLAN OF CARE: Patient  PLAN FOR NEXT SESSION: Trial manual, Trial Paraffin, grip and pinch exercises, stretching, possible splints for fingers   [facility] [phone] Chiquita LOISE Sermon, OTR/L 01/10/2024, 9:40 AM

## 2024-01-10 NOTE — Patient Instructions (Signed)
°  Strengthening Exercises  1) WRIST EXTENSION CURLS - TABLE  Hold a small free weight, rest your forearm on a table and bend your wrist up and down with your palm face down as shown.      2) WRIST FLEXION CURLS - TABLE  Hold a small free weight, rest your forearm on a table and bend your wrist up and down with your palm face up as shown.     3) FREE WEIGHT RADIAL/ULNAR DEVIATION - TABLE  Hold a small free weight, rest your forearm on a table and bend your wrist up and down with your palm facing towards the side as shown.     4) Pronation  Forearm supported on table with wrist in neutral position. Using a weight, roll wrist so that palm faces downward. Hold for 2 seconds and return to starting position.     5) Supination  Forearm supported on table with wrist in neutral position. Using a weight, roll wrist so that palm is now facing upward. Hold for 2 seconds and return to starting position.      *Complete exercises using ____ pound weight, __10__times each, _2___times per day*

## 2024-01-17 ENCOUNTER — Encounter (HOSPITAL_COMMUNITY): Payer: Self-pay | Admitting: Occupational Therapy

## 2024-01-17 ENCOUNTER — Ambulatory Visit (HOSPITAL_COMMUNITY): Admitting: Occupational Therapy

## 2024-01-17 DIAGNOSIS — R29898 Other symptoms and signs involving the musculoskeletal system: Secondary | ICD-10-CM

## 2024-01-17 DIAGNOSIS — R278 Other lack of coordination: Secondary | ICD-10-CM

## 2024-01-17 NOTE — Therapy (Signed)
 OUTPATIENT OCCUPATIONAL THERAPY ORTHO TREATMENT  Patient Name: Matthew Weiss. MRN: [id number] DOB:[birth year], 83 y.o., male Today's Date: 01/17/2024  PCP: Shona Norleen PEDLAR, MD REFERRING PROVIDER: Delene Blunt, MD  END OF SESSION:       Past Medical History:  Diagnosis Date   Arthritis    RA   Colon polyps    Depression    Dyslipidemia    Hypertension    LBBB (left bundle branch block)    Which comes and goes.   NICM (nonischemic cardiomyopathy) (HCC)    Echo 09/22/09, EF 45-50%   Rotator cuff tear    Shortness of breath    exertion   Past Surgical History:  Procedure Laterality Date   BACK SURGERY     BIOPSY  11/11/2021   Procedure: BIOPSY;  Surgeon: Eartha Angelia Sieving, MD;  [facility]: [facility];  Service: Gastroenterology;;   CARDIAC CATHETERIZATION  09/22/09   Mild non-obstructive CAD   CARDIOVASCULAR STRESS TEST  06/23/2008   Exercise:  normal perfusion. Low risk.   COLONOSCOPY  10/12/2011   Procedure: COLONOSCOPY;  Surgeon: Claudis RAYMOND Rivet, MD;  [facility]: [facility];  Service: Endoscopy;  Laterality: N/A;  930   COLONOSCOPY N/A 11/10/2016   Procedure: COLONOSCOPY;  Surgeon: Rivet Claudis RAYMOND, MD;  [facility];  Service: Endoscopy;  Laterality: N/A;  930   COLONOSCOPY WITH PROPOFOL  N/A 11/11/2021   Procedure: COLONOSCOPY WITH PROPOFOL ;  Surgeon: Eartha Angelia Sieving, MD;  [facility];  Service: Gastroenterology;  Laterality: N/A;  1030 ASA 3   EYE SURGERY     CATARACTS ; RIGHT EYE Jun 19, 2018, LEFT EYE JUNE 9TH, 2020 , SAW HIS EYE DOCTOR AFTER THE JUNE 9TH PROCEDURE   LEFT FOOT SURGERY   2015   POLYPECTOMY  11/10/2016   Procedure: POLYPECTOMY;  Surgeon: Rivet Claudis RAYMOND, MD;  [facility];  Service: Endoscopy;;  colon   POLYPECTOMY  11/11/2021   Procedure: POLYPECTOMY;  Surgeon: Eartha Angelia Sieving, MD;  [facility];  Service: Gastroenterology;;   SHOULDER OPEN ROTATOR CUFF REPAIR Left  07/25/2018   Procedure: Left shoulder rotator cuff repair with graft;  Surgeon: Heide Ingle, MD;  Location: [facility];  Service: Orthopedics;  Laterality: Left;    TRANSTHORACIC ECHOCARDIOGRAM  09/22/09   EF 45-50%.  No significant valvular disease.    Patient Active Problem List   Diagnosis Date Noted   Partial nontraumatic tear of rotator cuff 07/25/2018   Hypercholesterolemia 02/02/2018   Hx of colonic polyps 10/05/2016   Essential hypertension 10/03/2012   NICM (nonischemic cardiomyopathy): last echo 09/22/09.  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 He reports experiencing discomfort in both hands, with the  left hand exhibiting greater severity. He has been unable to grasp objects, such as a glass, due to this impairment. Additionally, he notes episodes of digital locking upon attempting to flex his fingers from an extended position. These symptoms have persisted for an extended duration, with the left hand being symptomatic for a longer period than the right. The patient has a known diagnosis of rheumatoid arthritis, for which he is currently undergoing  methotrexate  therapy.   PRECAUTIONS: None  WEIGHT BEARING RESTRICTIONS: No  PAIN:  Are you having pain? No  FALLS: Has patient fallen in last 6 months? No  PLOF: Independent  PATIENT GOALS: To move my hands more  NEXT MD VISIT: 02/12/24  OBJECTIVE:  Note: Objective measures were completed at Evaluation unless otherwise noted.  HAND DOMINANCE: Left  ADLs: Overall ADLs: Pt unable to complete fine motor tasks, like small buttons, zippers, and clasps. Pt also is not able to grasp and maintain hold on objects such as a drinking glass. Pt also has mod to max difficulty with dressing due to hold on clothing items.   FUNCTIONAL OUTCOME MEASURES: Quick Dash: 36.36  UPPER EXTREMITY ROM:     Active ROM Right eval Left eval  Thumb MCP (0-60) 65 65  Thumb IP (0-80) 15 15  Thumb Opposition to Small Finger able able   Index MCP (0-90) 65 85   Index PIP (0-100) 95  80  Index DIP (0-70)  60  60  Long MCP (0-90)  80  90  Long PIP (0-100)  100  85  Long DIP (0-70)  70  75  Ring MCP (0-90)  75  90  Ring PIP (0-100)  100  90  Ring DIP (0-70)  65  75  Little MCP (0-90)  95  85  Little PIP (0-100) 95   90  Little DIP (0-70) 60   70  (Blank rows = not tested)  BUE make 90-95% of a full fist with increased effort  HAND FUNCTION: Grip strength: Right: 69 lbs; Left: 79 lbs, Lateral pinch: Right: 19 lbs, Left: 16 lbs, and 3 point pinch: Right: 14 lbs, Left: 13 lbs  COORDINATION: 9 Hole Peg test: Right: 41.37 sec; Left: 1 min 31 sec  SENSATION: WFL  EDEMA: Mild edema noted in the LUE and in all IP joints  OBSERVATIONS: Hyperextension in PIP joints of LUE D2-D4 and RUE D3   TREATMENT DATE:   01/17/24 -Towel crumple: x10 each hand  -Grip strength: picking up beads with gripper in horizontal with L and R hand 8x, vertical 8x R/L hands (22#)  - Theraputty: flatten ball, grip on PVC pipe to cut cookies with L/R hands  -Wrist strengthening: 3 pound weight, flexion/extension,  radial/ulnar deviation, pronation/supination  -Fine motor: removing 5 bolts with L hand and putting back on with R hand   01/10/24 -Digit ROM: composite flexion, abduction, finger taps, opposition, x10 -Stretching into extension with manual therapy to reduce fascial restrictions in the palmar aspect of hand and improve ROM -Fine motor: removing 6 bolts with L hand and putting back on with R hand  -Wrist/grip strength: wrist flexion/extension curls, supination/pronation, radial/ulnar deviation 10x each with 1lb weight  -Towel crumple: x10 each hand  -Paraffin bath with moist heat 10', BUE  01/03/24 -Edema massage from fingers up to forearm to reduce swelling and improve mobility. -Digit ROM: composite flexion, abduction, finger taps, opposition, x10 -Pinch strength: squeezing clips onto tree with BL hands  -Towel crumple: x2 -Paraffin bath with moist  heat 10', BUE                                                          PATIENT EDUCATION: Education details: fitting digit flexion splints  Person educated: Patient Education method: Explanation, Demonstration, and Handouts Education comprehension: verbalized understanding and returned demonstration  HOME EXERCISE PROGRAM: 11/17: Digit ROM and Edema Relief  GOALS: Goals reviewed with patient? Yes  SHORT TERM GOALS: Target date: 02/09/23  Pt will be provided with and educated on HEP to improve strength and mobility required for functional use of BUE during ADLs.   Goal status: INITIAL  2.  Pt will decrease pain in bilateral hands to 2/10 or less to improve ability to perform ADL tasks with less than 2 rest breaks per task.   Goal status: INITIAL  3.  Pt will be provided with education on AE and DME available to improve safety and independence in ADL completion.   Goal status: INITIAL  4.  Pt will increase bilateral grip strength by 10# and pinch strength by 2# to improve ability to grasp and hold items when performing simple meal  prep or household tasks.   Goal status: INITIAL   ASSESSMENT:  CLINICAL IMPRESSION: Pt demonstrating increased ability to complete finger taps this session as compared to last week. Added in grip strength activities with gripper and theraputty. He has a more difficult time using gripper in vertical position. Discussed that his splints have been ordered we are just waiting for them to arrive. Discussed two remaining sessions.   PERFORMANCE DEFICITS: in functional skills including ADLs, IADLs, coordination, dexterity, sensation, edema, ROM, fascial restrictions, Fine motor control, body mechanics, and UE functional use.   PLAN:  OT FREQUENCY: 1x/week  OT DURATION: 6 weeks  PLANNED INTERVENTIONS: 97168 OT Re-evaluation, 97535 self care/ADL training, 02889 therapeutic exercise, 97530 therapeutic activity, 97112 neuromuscular re-education, 97140 manual therapy, 97035 ultrasound, 97010 moist heat, 97032 electrical stimulation (manual), passive range of motion, functional mobility training, energy conservation, coping strategies training, patient/family education, and DME and/or AE instructions  RECOMMENDED OTHER SERVICES: N/A  CONSULTED AND AGREED WITH PLAN OF CARE: Patient  PLAN FOR NEXT SESSION: Trial manual, Trial Paraffin, grip and pinch exercises, stretching, possible splints for fingers   [facility] [phone] Chiquita LOISE Sermon, OTR/L 01/17/2024, 9:00 AM

## 2024-01-23 ENCOUNTER — Encounter (HOSPITAL_COMMUNITY): Payer: Self-pay | Admitting: Occupational Therapy

## 2024-01-23 ENCOUNTER — Ambulatory Visit (HOSPITAL_COMMUNITY): Admitting: Occupational Therapy

## 2024-01-23 DIAGNOSIS — R278 Other lack of coordination: Secondary | ICD-10-CM | POA: Diagnosis not present

## 2024-01-23 DIAGNOSIS — R29898 Other symptoms and signs involving the musculoskeletal system: Secondary | ICD-10-CM

## 2024-01-23 NOTE — Patient Instructions (Signed)
Home Exercises Program Theraputty Exercises  Do the following exercises 1-2 times a day using your affected hand.  1. Roll putty into a ball.  2. Make into a pancake.  3. Roll putty into a roll.  4. Pinch along log with first finger and thumb.   5. Make into a ball.  6. Roll it back into a log.   7. Pinch using thumb and side of first finger.  8. Roll into a ball, then flatten into a pancake.  9. Using your fingers, make putty into a mountain.  10. Roll putty back into a ball and squeeze gently for 2-3 minutes.   

## 2024-01-23 NOTE — Therapy (Signed)
 " OUTPATIENT OCCUPATIONAL THERAPY ORTHO TREATMENT  Patient Name: Matthew Weiss. MRN: [id number] DOB:19-Jul-1940, 83 y.o., male Today's Date: 01/23/2024  PCP: Shona Norleen PEDLAR, MD REFERRING PROVIDER: Delene Blunt, MD  END OF SESSION:  OT End of Session - 01/23/24 1105     Visit Number 6    Number of Visits 7    Date for Recertification  02/09/24    Authorization Type [employer]    Authorization Time Period no auth required    Progress Note Due on Visit 10    OT Start Time 1105    OT Stop Time 1144    OT Time Calculation (min) 39 min    Activity Tolerance Patient tolerated treatment well    Behavior During Therapy WFL for tasks assessed/performed              Past Medical History:  Diagnosis Date   Arthritis    RA   Colon polyps    Depression    Dyslipidemia    Hypertension    LBBB (left bundle branch block)    Which comes and goes.   NICM (nonischemic cardiomyopathy) (HCC)    Echo 09/22/09, EF 45-50%   Rotator cuff tear    Shortness of breath    exertion   Past Surgical History:  Procedure Laterality Date   BACK SURGERY     BIOPSY  11/11/2021   Procedure: BIOPSY;  Surgeon: Eartha Angelia Sieving, MD;  Location: [facility];  Service: Gastroenterology;;   CARDIAC CATHETERIZATION  09/22/09   Mild non-obstructive CAD   CARDIOVASCULAR STRESS TEST  06/23/2008   Exercise:  normal perfusion. Low risk.   COLONOSCOPY  10/12/2011   Procedure: COLONOSCOPY;  Surgeon: Claudis RAYMOND Rivet, MD;  Location: [facility];  Service: Endoscopy;  Laterality: N/A;  930   COLONOSCOPY N/A 11/10/2016   Procedure: COLONOSCOPY;  Surgeon: Rivet Claudis RAYMOND, MD;  Location: [facility];  Service: Endoscopy;  Laterality: N/A;  930   COLONOSCOPY WITH PROPOFOL  N/A 11/11/2021   Procedure: COLONOSCOPY WITH PROPOFOL ;  Surgeon: Eartha Angelia Sieving, MD;  Location: [facility];  Service: Gastroenterology;  Laterality: N/A;  1030 ASA 3   EYE SURGERY     CATARACTS ; RIGHT  EYE Jun 19, 2018, LEFT EYE JUNE 9TH, 2020 , SAW HIS EYE DOCTOR AFTER THE JUNE 9TH PROCEDURE   LEFT FOOT SURGERY   2015   POLYPECTOMY  11/10/2016   Procedure: POLYPECTOMY;  Surgeon: Rivet Claudis RAYMOND, MD;  Location: [facility];  Service: Endoscopy;;  colon   POLYPECTOMY  11/11/2021   Procedure: POLYPECTOMY;  Surgeon: Eartha Angelia Sieving, MD;  Location: [facility];  Service: Gastroenterology;;   SHOULDER OPEN ROTATOR CUFF REPAIR Left 07/25/2018   Procedure: Left shoulder rotator cuff repair with graft;  Surgeon: Heide Ingle, MD;  Location: [facility];  Service: Orthopedics;  Laterality: Left;    TRANSTHORACIC ECHOCARDIOGRAM  09/22/09   EF 45-50%.  No significant valvular disease.    Patient Active Problem List   Diagnosis Date Noted   Partial nontraumatic tear of rotator cuff 07/25/2018   Hypercholesterolemia 02/02/2018   Hx of colonic polyps 10/05/2016   Essential hypertension 10/03/2012   NICM (nonischemic cardiomyopathy): last echo 09/22/09.  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 He reports experiencing discomfort in both hands, with the left hand exhibiting greater severity. He has been  unable to grasp objects, such as a glass, due to this impairment. Additionally, he notes episodes of digital locking upon attempting to flex his fingers from an extended position. These symptoms have persisted for an extended duration, with the left hand being symptomatic for a longer period than the right. The patient has a known diagnosis of rheumatoid arthritis, for which he is currently undergoing methotrexate  therapy.   PRECAUTIONS: None  WEIGHT BEARING RESTRICTIONS: No  PAIN:  Are you having pain? No  FALLS: Has patient fallen in last 6 months? No  PLOF: Independent  PATIENT GOALS: To move my hands more  NEXT MD VISIT: 02/12/24  OBJECTIVE:  Note: Objective measures were completed at Evaluation unless otherwise noted.  HAND DOMINANCE: Left  ADLs: Overall ADLs: Pt unable to complete fine motor tasks, like small buttons, zippers, and clasps. Pt also is not able to grasp and maintain hold on objects such as a drinking glass. Pt also has mod to max difficulty with dressing due to hold on clothing items.   FUNCTIONAL OUTCOME MEASURES: Quick Dash: 36.36  UPPER EXTREMITY ROM:     Active ROM Right eval Left eval  Thumb MCP (0-60) 65 65  Thumb IP (0-80) 15 15  Thumb Opposition to Small Finger able able   Index MCP (0-90) 65 85   Index PIP (0-100) 95  80  Index DIP (0-70)  60  60  Long MCP (0-90)  80  90  Long PIP (0-100)  100  85  Long DIP (0-70)  70  75  Ring MCP (0-90)  75  90  Ring PIP (0-100)  100  90  Ring DIP (0-70)  65  75  Little MCP (0-90)  95  85  Little PIP (0-100) 95   90  Little DIP (0-70) 60   70  (Blank rows = not tested)  BUE make 90-95% of a full fist with increased effort  HAND FUNCTION: Grip strength: Right: 69 lbs; Left: 79 lbs, Lateral pinch: Right: 19 lbs, Left: 16 lbs, and 3 point pinch: Right: 14 lbs, Left: 13 lbs  COORDINATION: 9 Hole Peg test: Right: 41.37 sec; Left: 1 min 31 sec  SENSATION: WFL  EDEMA: Mild edema noted in the LUE  and in all IP joints  OBSERVATIONS: Hyperextension in PIP joints of LUE D2-D4 and RUE D3   TREATMENT DATE:  01/23/24 -Theraputty: red-rolling into ball, flattening, rolling into log, tip pinch; repeat; lateral pinch -Provided oval eight splints for assistance in controlling digit hyperextension -Grip strengthening: large and medium beads with gripper vertical at 42# -Pinch strengthening: pt using 3 point pinch and green clothespin to grasp and stack 3 towers of 5 sponges  01/17/24 -Towel crumple: x10 each hand  -Grip strength: picking up beads with gripper in horizontal with L and R hand 8x, vertical 8x R/L hands (22#)  - Theraputty: flatten ball, grip on PVC  pipe to cut cookies with L/R hands  -Wrist strengthening: 3 pound weight, flexion/extension, radial/ulnar deviation, pronation/supination  -Fine motor: removing 5 bolts with L hand and putting back on with R hand   01/10/24 -Digit ROM: composite flexion, abduction, finger taps, opposition, x10 -Stretching into extension with manual therapy to reduce fascial restrictions in the palmar aspect of hand and improve ROM -Fine motor: removing 6 bolts with L hand and putting back on with R hand  -Wrist/grip strength: wrist flexion/extension curls, supination/pronation, radial/ulnar deviation 10x each with 1lb weight  -Towel crumple: x10 each hand  -Paraffin bath with moist heat 10', BUE                                                     PATIENT EDUCATION: Education details: Oval eight splints; red theraputty for grip/pinch Person educated: Patient Education method: Explanation, Demonstration, and Handouts Education comprehension: verbalized understanding and returned demonstration  HOME EXERCISE PROGRAM: 11/17: Digit ROM and Edema Relief 12/23: Digit flexion splints; theraputty grip and pinch strengthening; button hook, rocker knife education   GOALS: Goals reviewed with patient? Yes  SHORT TERM GOALS: Target date:  02/09/23  Pt will be provided with and educated on HEP to improve strength and mobility required for functional use of BUE during ADLs.   Goal status: IN PROGRESS  2.  Pt will decrease pain in bilateral hands to 2/10 or less to improve ability to perform ADL tasks with less than 2 rest breaks per task.   Goal status: IN PROGRESS  3.  Pt will be provided with education on AE and DME available to improve safety and independence in ADL completion.   Goal status: IN PROGRESS  4.  Pt will increase bilateral grip strength by 10# and pinch strength by 2# to improve ability to grasp and hold items when performing simple meal prep or household tasks.   Goal status: IN PROGRESS   ASSESSMENT:  CLINICAL IMPRESSION: Provided oval eight splints today, pt with neutral PIPs once donned, recommended pt wear at night, can wear as he wishes during the day. Educated on AE available today, pt reports he won't purchase until he cannot complete tasks himself. Session focusing on grip and pinch strengthening, completed HEP tasks with cuing for focusing on PIP flexion. Pt with mod fatigue during grip strengthening. Verbal cuing for form and technique throughout session.     PERFORMANCE DEFICITS: in functional skills including ADLs, IADLs, coordination, dexterity, sensation, edema, ROM, fascial restrictions, Fine motor control, body mechanics, and UE functional use.   PLAN:  OT FREQUENCY: 1x/week  OT DURATION: 6 weeks  PLANNED INTERVENTIONS: 97168 OT Re-evaluation, 97535 self care/ADL training, 02889 therapeutic exercise, 97530 therapeutic activity, 97112 neuromuscular re-education, 97140 manual therapy, 97035 ultrasound, 97010 moist heat, 97032 electrical stimulation (manual), passive range of motion, functional mobility training, energy conservation, coping strategies training, patient/family education, and DME and/or AE instructions  RECOMMENDED OTHER SERVICES: N/A  CONSULTED AND AGREED WITH PLAN OF  CARE: Patient  PLAN FOR NEXT SESSION: Reassessment, follow up on splint wear   Sonny Cory, OTR/L  [phone] 01/23/2024, 11:44 AM   "

## 2024-01-30 ENCOUNTER — Encounter (HOSPITAL_COMMUNITY): Payer: Self-pay | Admitting: Occupational Therapy

## 2024-01-30 ENCOUNTER — Ambulatory Visit (HOSPITAL_COMMUNITY): Admitting: Occupational Therapy

## 2024-01-30 DIAGNOSIS — R278 Other lack of coordination: Secondary | ICD-10-CM

## 2024-01-30 DIAGNOSIS — R29898 Other symptoms and signs involving the musculoskeletal system: Secondary | ICD-10-CM

## 2024-01-30 NOTE — Therapy (Signed)
 " OUTPATIENT OCCUPATIONAL THERAPY ORTHO TREATMENT  Patient Name: Matthew Weiss. MRN: [id number] DOB:1940/04/13, 83 y.o., male Today's Date: 01/30/2024  PCP: Shona Norleen PEDLAR, MD REFERRING PROVIDER: Delene Blunt, MD  OCCUPATIONAL THERAPY DISCHARGE SUMMARY  Visits from Start of Care: 7  Current functional level related to goals / functional outcomes: Pt has been provided comprehensive HEP, has minimal to no pain, and has been educated on AE for improved independence of ADL's.    Remaining deficits: Pt's strength is improving, however continues to be limited, specifically on the R side.    Education / Equipment: Pt provided comprehensive HEP.    Plan: Patient agrees to discharge as he feels he has improved enough and knows what to keep working on at home.      END OF SESSION:  OT End of Session - 01/30/24 1024     Visit Number 7    Number of Visits 7    Date for Recertification  02/09/24    Authorization Type [employer]    Authorization Time Period no auth required    Progress Note Due on Visit 10    OT Start Time [phone]    OT Stop Time 1015    OT Time Calculation (min) 29 min    Activity Tolerance Patient tolerated treatment well    Behavior During Therapy WFL for tasks assessed/performed          Past Medical History:  Diagnosis Date   Arthritis    RA   Colon polyps    Depression    Dyslipidemia    Hypertension    LBBB (left bundle branch block)    Which comes and goes.   NICM (nonischemic cardiomyopathy) (HCC)    Echo 09/22/09, EF 45-50%   Rotator cuff tear    Shortness of breath    exertion   Past Surgical History:  Procedure Laterality Date   BACK SURGERY     BIOPSY  11/11/2021   Procedure: BIOPSY;  Surgeon: Eartha Angelia Sieving, MD;  Location: [facility];  Service: Gastroenterology;;   CARDIAC CATHETERIZATION  09/22/09   Mild non-obstructive CAD   CARDIOVASCULAR STRESS TEST  06/23/2008   Exercise:  normal perfusion. Low risk.    COLONOSCOPY  10/12/2011   Procedure: COLONOSCOPY;  Surgeon: Claudis RAYMOND Rivet, MD;  Location: [facility];  Service: Endoscopy;  Laterality: N/A;  930   COLONOSCOPY N/A 11/10/2016   Procedure: COLONOSCOPY;  Surgeon: Rivet Claudis RAYMOND, MD;  Location: [facility];  Service: Endoscopy;  Laterality: N/A;  930   COLONOSCOPY WITH PROPOFOL  N/A 11/11/2021   Procedure: COLONOSCOPY WITH PROPOFOL ;  Surgeon: Eartha Angelia Sieving, MD;  Location: [facility];  Service: Gastroenterology;  Laterality: N/A;  1030 ASA 3   EYE SURGERY     CATARACTS ; RIGHT EYE Jun 19, 2018, LEFT EYE JUNE 9TH, 2020 , SAW HIS EYE DOCTOR AFTER THE JUNE 9TH PROCEDURE   LEFT FOOT SURGERY   2015   POLYPECTOMY  11/10/2016   Procedure: POLYPECTOMY;  Surgeon: Rivet Claudis RAYMOND, MD;  Location: [facility];  Service: Endoscopy;;  colon   POLYPECTOMY  11/11/2021   Procedure: POLYPECTOMY;  Surgeon: Eartha Angelia Sieving, MD;  Location: [facility];  Service: Gastroenterology;;   SHOULDER OPEN ROTATOR CUFF REPAIR Left 07/25/2018   Procedure: Left shoulder rotator cuff repair with graft;  Surgeon: Heide Ingle, MD;  Location: [facility];  Service: Orthopedics;  Laterality: Left;    TRANSTHORACIC ECHOCARDIOGRAM  09/22/09  EF 45-50%.  No significant valvular disease.    Patient Active Problem List   Diagnosis Date Noted   Partial nontraumatic tear of rotator cuff 07/25/2018   Hypercholesterolemia 02/02/2018   Hx of colonic polyps 10/05/2016   Essential hypertension 10/03/2012   NICM (nonischemic cardiomyopathy): last echo 09/22/09.  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 He reports experiencing discomfort in both hands, with the left hand exhibiting greater severity. He has been unable to grasp objects, such as a glass, due to this impairment. Additionally, he notes episodes of digital locking upon attempting to flex his fingers from an extended position. These symptoms have persisted for an extended duration, with the left hand being symptomatic for a longer period than the right. The patient has a known diagnosis of rheumatoid arthritis, for which he is currently undergoing methotrexate  therapy.   PRECAUTIONS: None  WEIGHT BEARING RESTRICTIONS: No  PAIN:  Are you having pain? No  FALLS: Has patient fallen in last 6 months? No  PLOF: Independent  PATIENT GOALS: To move my hands more  NEXT MD VISIT: 02/12/24  OBJECTIVE:  Note: Objective measures were completed at Evaluation unless otherwise noted.  HAND DOMINANCE: Left  ADLs: Overall ADLs: Pt unable to complete fine motor tasks, like small buttons, zippers, and clasps. Pt also is not able to grasp and maintain hold on objects such as a drinking glass. Pt also has mod to max difficulty with dressing due to hold on clothing items.   FUNCTIONAL OUTCOME MEASURES: Quick Dash: 36.36 01/30/24: 15.91  UPPER EXTREMITY ROM:     Active ROM Right eval Left eval Right 01/30/24 Left 01/30/24  Thumb MCP (0-60) 65 65 70 70  Thumb IP (0-80) 15 15 20 30   Thumb Opposition to Small Finger able able  able able  Index MCP (0-90) 65 85  60 75  Index PIP (0-100)  95  80 95 80  Index DIP (0-70)  60  60 60 55  Long MCP (0-90)  80  90 80 85  Long PIP (0-100)  100  85 95 85  Long DIP (0-70)  70  75 65 55  Ring MCP (0-90)  75  90 80 85  Ring PIP (0-100)  100  90 105 85  Ring DIP (0-70)  65  75 65 55  Little MCP (0-90)  95  85 85 90  Little PIP (0-100) 95   90 90 90  Little DIP (0-70) 60   70 65 30  (Blank rows = not tested)  BUE make 90-95% of a full fist with increased effort  HAND FUNCTION: Grip strength: Right: 69 lbs; Left: 79 lbs, Lateral pinch: Right: 19 lbs, Left: 16 lbs, and 3 point pinch: Right: 14 lbs, Left: 13 lbs 01/30/24: Grip strength:  Right: 70 lbs; Left: 90 lbs, Lateral pinch: Right: 20 lbs, Left: 21 lbs, and 3 point pinch: Right: 15 lbs, Left: 13 lbs  COORDINATION: 9 Hole Peg test: Right: 41.37 sec; Left: 1 min 31 sec 01/30/24: 9 Hole Peg test: Right: 36.39 sec; Left: 2 min 08 sec  SENSATION: WFL  EDEMA: Mild edema noted in the LUE and in all IP joints  OBSERVATIONS: Hyperextension in PIP joints of LUE D2-D4 and RUE D3   TREATMENT DATE:  01/30/24 -Wrist ROM: flexion, extension, ulnar/radial deviation, supination/pronation, x10 -Digit ROM: composite flexion, abduction, finger taps, opposition, x10 -Towel crumple -9 hole peg test -Reassessment  01/23/24 -Theraputty: red-rolling into ball, flattening, rolling into log, tip pinch; repeat; lateral pinch -Provided oval eight splints for assistance in controlling digit hyperextension -Grip strengthening: large and medium beads with gripper vertical at 42# -Pinch strengthening: pt using 3 point pinch and green clothespin to grasp and stack 3 towers of 5 sponges  01/17/24 -Towel crumple: x10 each hand  -Grip strength: picking up beads with gripper in horizontal with L and R hand 8x, vertical 8x R/L hands (22#)  - Theraputty: flatten ball, grip on PVC pipe to cut cookies with L/R hands  -Wrist strengthening: 3 pound weight, flexion/extension, radial/ulnar deviation,  pronation/supination  -Fine motor: removing 5 bolts with L hand and putting back on with R hand    PATIENT EDUCATION: Education details: Review HEP Person educated: Patient Education method: Explanation, Demonstration, and Handouts Education comprehension: verbalized understanding and returned demonstration  HOME EXERCISE PROGRAM: 11/17: Digit ROM and Edema Relief 12/23: Digit flexion splints; theraputty grip and pinch strengthening; button hook, rocker knife education    GOALS: Goals reviewed with patient? Yes  SHORT TERM GOALS: Target date: 02/09/23  Pt will be provided with and educated on HEP to improve strength and mobility required for functional use of BUE during ADLs.   Goal status: MET  2.  Pt will decrease pain in bilateral hands to 2/10 or less to improve ability to perform ADL tasks with less than 2 rest breaks per task.   Goal status: MET  3.  Pt will be provided with education on AE and DME available to improve safety and independence in ADL completion.   Goal status: MET  4.  Pt will increase bilateral grip strength by 10# and pinch strength by 2# to improve ability to grasp and hold items when performing simple meal prep or household tasks.   Goal status: PARTIALLY MET   ASSESSMENT:  CLINICAL IMPRESSION: This session pt completed reassessment and demonstrated improvements in strength, as well as good completion of HEP and wearing finger splints. Overall pt reports no pain and improved mobility with decreased finger joint locking noted. He feels that he has no further skilled OT needs and agrees to be discharged from Outpatient OT.    PERFORMANCE DEFICITS: in functional skills including ADLs, IADLs, coordination, dexterity, sensation, edema, ROM, fascial restrictions, Fine motor control, body mechanics, and UE functional use.   PLAN:  OT FREQUENCY: 1x/week  OT DURATION: 6 weeks  PLANNED INTERVENTIONS: 97168 OT Re-evaluation, 97535 self care/ADL  training, 02889 therapeutic exercise, 97530 therapeutic activity, 97112 neuromuscular re-education, 97140 manual therapy, 97035 ultrasound, 97010 moist heat, 97032 electrical stimulation (manual), passive range of motion, functional mobility training, energy conservation, coping strategies training, patient/family education, and DME and/or AE instructions  RECOMMENDED OTHER SERVICES: N/A  CONSULTED AND AGREED WITH PLAN OF CARE: Patient  PLAN FOR NEXT SESSION: Discharge   Yoselyn Mcglade Thelbert, OTR/L  [phone] 01/30/2024, 10:24 AM   "

## 2024-07-01 ENCOUNTER — Ambulatory Visit: Admitting: Family Medicine
